# Patient Record
Sex: Male | Born: 1970
Health system: Southern US, Community
[De-identification: ages and names within clinical notes are randomized; demographics above are authoritative.]

## PROBLEM LIST (undated history)

## (undated) ENCOUNTER — Emergency Department (HOSPITAL_COMMUNITY): Disposition: A | Discharge status: Other Acute Inpatient Hospital | Payer: Medicaid Other

## (undated) DIAGNOSIS — I10 Essential (primary) hypertension: Secondary | ICD-10-CM

## (undated) DIAGNOSIS — F419 Anxiety disorder, unspecified: Secondary | ICD-10-CM

## (undated) DIAGNOSIS — N3090 Cystitis, unspecified without hematuria: Secondary | ICD-10-CM

## (undated) DIAGNOSIS — B029 Zoster without complications: Secondary | ICD-10-CM

## (undated) DIAGNOSIS — R51 Headache: Secondary | ICD-10-CM

## (undated) DIAGNOSIS — L0390 Cellulitis, unspecified: Secondary | ICD-10-CM

## (undated) DIAGNOSIS — E785 Hyperlipidemia, unspecified: Secondary | ICD-10-CM

## (undated) DIAGNOSIS — N189 Chronic kidney disease, unspecified: Secondary | ICD-10-CM

## (undated) DIAGNOSIS — G4733 Obstructive sleep apnea (adult) (pediatric): Secondary | ICD-10-CM

## (undated) DIAGNOSIS — H3500 Unspecified background retinopathy: Secondary | ICD-10-CM

## (undated) DIAGNOSIS — J45909 Unspecified asthma, uncomplicated: Secondary | ICD-10-CM

## (undated) DIAGNOSIS — N182 Chronic kidney disease, stage 2 (mild): Secondary | ICD-10-CM

## (undated) DIAGNOSIS — J069 Acute upper respiratory infection, unspecified: Secondary | ICD-10-CM

## (undated) DIAGNOSIS — Z8719 Personal history of other diseases of the digestive system: Secondary | ICD-10-CM

## (undated) DIAGNOSIS — R011 Cardiac murmur, unspecified: Secondary | ICD-10-CM

## (undated) DIAGNOSIS — E1140 Type 2 diabetes mellitus with diabetic neuropathy, unspecified: Secondary | ICD-10-CM

## (undated) HISTORY — DX: Essential (primary) hypertension: I10

## (undated) HISTORY — DX: Obstructive sleep apnea (adult) (pediatric): G47.33

## (undated) HISTORY — DX: Type 2 diabetes mellitus with diabetic neuropathy, unspecified: E11.40

## (undated) HISTORY — DX: Hyperlipidemia, unspecified: E78.5

## (undated) HISTORY — DX: Anxiety disorder, unspecified: F41.9

## (undated) HISTORY — DX: Unspecified background retinopathy: H35.00

## (undated) HISTORY — DX: Zoster without complications: B02.9

---
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
## 1999-12-23 ENCOUNTER — Encounter: Payer: Self-pay | Admitting: Emergency Medicine

## 1999-12-23 ENCOUNTER — Emergency Department (HOSPITAL_COMMUNITY): Admission: EM | Admit: 1999-12-23 | Discharge: 1999-12-23 | Payer: Self-pay | Admitting: Emergency Medicine

## 2000-10-11 ENCOUNTER — Emergency Department (HOSPITAL_COMMUNITY): Admission: EM | Admit: 2000-10-11 | Discharge: 2000-10-11 | Payer: Self-pay | Admitting: Emergency Medicine

## 2004-07-15 ENCOUNTER — Ambulatory Visit: Payer: Self-pay | Admitting: Family Medicine

## 2004-07-22 ENCOUNTER — Ambulatory Visit: Payer: Self-pay | Admitting: Family Medicine

## 2004-08-12 ENCOUNTER — Ambulatory Visit: Payer: Self-pay | Admitting: Family Medicine

## 2004-08-19 ENCOUNTER — Ambulatory Visit: Payer: Self-pay | Admitting: Family Medicine

## 2004-11-25 ENCOUNTER — Ambulatory Visit: Payer: Self-pay | Admitting: Family Medicine

## 2005-03-24 ENCOUNTER — Ambulatory Visit: Payer: Self-pay | Admitting: Family Medicine

## 2005-06-23 ENCOUNTER — Ambulatory Visit: Payer: Self-pay | Admitting: Family Medicine

## 2006-07-05 ENCOUNTER — Ambulatory Visit: Payer: Self-pay | Admitting: Family Medicine

## 2006-10-21 ENCOUNTER — Ambulatory Visit: Payer: Self-pay | Admitting: Internal Medicine

## 2006-10-22 ENCOUNTER — Ambulatory Visit: Payer: Self-pay | Admitting: Family Medicine

## 2006-10-22 LAB — CONVERTED CEMR LAB: Hgb A1c MFr Bld: 9.4 % — ABNORMAL HIGH (ref 4.6–6.0)

## 2006-11-03 ENCOUNTER — Ambulatory Visit: Payer: Self-pay | Admitting: Family Medicine

## 2006-12-08 ENCOUNTER — Encounter: Payer: Self-pay | Admitting: Family Medicine

## 2006-12-10 DIAGNOSIS — I10 Essential (primary) hypertension: Secondary | ICD-10-CM

## 2006-12-10 DIAGNOSIS — E11319 Type 2 diabetes mellitus with unspecified diabetic retinopathy without macular edema: Secondary | ICD-10-CM

## 2007-01-03 ENCOUNTER — Ambulatory Visit: Payer: Self-pay | Admitting: Family Medicine

## 2007-01-03 DIAGNOSIS — H6500 Acute serous otitis media, unspecified ear: Secondary | ICD-10-CM

## 2007-01-10 ENCOUNTER — Ambulatory Visit: Payer: Self-pay | Admitting: Family Medicine

## 2007-01-25 ENCOUNTER — Ambulatory Visit: Payer: Self-pay | Admitting: Family Medicine

## 2007-01-25 DIAGNOSIS — J019 Acute sinusitis, unspecified: Secondary | ICD-10-CM | POA: Insufficient documentation

## 2007-04-26 ENCOUNTER — Ambulatory Visit: Payer: Self-pay | Admitting: Family Medicine

## 2007-07-04 ENCOUNTER — Telehealth: Payer: Self-pay | Admitting: Family Medicine

## 2007-07-07 ENCOUNTER — Telehealth: Payer: Self-pay | Admitting: Family Medicine

## 2007-07-08 ENCOUNTER — Ambulatory Visit: Payer: Self-pay | Admitting: Family Medicine

## 2008-02-21 ENCOUNTER — Ambulatory Visit: Payer: Self-pay | Admitting: Family Medicine

## 2008-02-21 DIAGNOSIS — F411 Generalized anxiety disorder: Secondary | ICD-10-CM

## 2008-03-07 ENCOUNTER — Ambulatory Visit: Payer: Self-pay | Admitting: Family Medicine

## 2008-03-07 DIAGNOSIS — M722 Plantar fascial fibromatosis: Secondary | ICD-10-CM | POA: Insufficient documentation

## 2008-04-18 ENCOUNTER — Ambulatory Visit: Payer: Self-pay | Admitting: Family Medicine

## 2008-04-23 ENCOUNTER — Ambulatory Visit: Payer: Self-pay | Admitting: Family Medicine

## 2008-05-18 DIAGNOSIS — B029 Zoster without complications: Secondary | ICD-10-CM

## 2008-05-18 HISTORY — DX: Zoster without complications: B02.9

## 2008-05-28 ENCOUNTER — Ambulatory Visit: Payer: Self-pay | Admitting: Family Medicine

## 2008-05-28 DIAGNOSIS — B029 Zoster without complications: Secondary | ICD-10-CM | POA: Insufficient documentation

## 2008-05-28 DIAGNOSIS — F528 Other sexual dysfunction not due to a substance or known physiological condition: Secondary | ICD-10-CM | POA: Insufficient documentation

## 2008-07-08 ENCOUNTER — Telehealth: Payer: Self-pay | Admitting: Internal Medicine

## 2008-07-10 ENCOUNTER — Ambulatory Visit: Payer: Self-pay | Admitting: Family Medicine

## 2008-09-20 ENCOUNTER — Ambulatory Visit: Payer: Self-pay | Admitting: Family Medicine

## 2008-10-22 ENCOUNTER — Ambulatory Visit: Payer: Self-pay | Admitting: Family Medicine

## 2008-10-22 LAB — CONVERTED CEMR LAB
Bilirubin Urine: NEGATIVE
Glucose, Urine, Semiquant: >=1000
Ketones, urine, test strip: NEGATIVE
Nitrite: NEGATIVE
Protein, U semiquant: NEGATIVE
Specific Gravity, Urine: 1.02
Urobilinogen, UA: 0.2
WBC Urine, dipstick: NEGATIVE
pH: 5

## 2008-12-10 ENCOUNTER — Ambulatory Visit: Payer: Self-pay | Admitting: Family Medicine

## 2008-12-10 DIAGNOSIS — I803 Phlebitis and thrombophlebitis of lower extremities, unspecified: Secondary | ICD-10-CM | POA: Insufficient documentation

## 2008-12-11 ENCOUNTER — Ambulatory Visit: Payer: Self-pay

## 2008-12-12 ENCOUNTER — Encounter: Payer: Self-pay | Admitting: Family Medicine

## 2009-01-22 ENCOUNTER — Telehealth: Payer: Self-pay | Admitting: Family Medicine

## 2009-01-24 ENCOUNTER — Encounter: Payer: Self-pay | Admitting: Family Medicine

## 2009-02-04 ENCOUNTER — Telehealth: Payer: Self-pay | Admitting: Family Medicine

## 2009-02-09 ENCOUNTER — Telehealth: Payer: Self-pay | Admitting: Family Medicine

## 2009-02-12 ENCOUNTER — Ambulatory Visit: Payer: Self-pay | Admitting: Family Medicine

## 2009-02-13 ENCOUNTER — Telehealth: Payer: Self-pay | Admitting: Family Medicine

## 2009-02-13 LAB — CONVERTED CEMR LAB
ALT: 31 units/L (ref 0–53)
AST: 22 units/L (ref 0–37)
Albumin: 4.1 g/dL (ref 3.5–5.2)
Alkaline Phosphatase: 81 units/L (ref 39–117)
BUN: 13 mg/dL (ref 6–23)
Basophils Absolute: 0 10*3/uL (ref 0.0–0.1)
Basophils Relative: 0.6 % (ref 0.0–3.0)
Bilirubin, Direct: 0.1 mg/dL (ref 0.0–0.3)
CO2: 31 meq/L (ref 19–32)
Calcium: 8.9 mg/dL (ref 8.4–10.5)
Chloride: 108 meq/L (ref 96–112)
Cholesterol: 147 mg/dL (ref 0–200)
Creatinine, Ser: 0.7 mg/dL (ref 0.4–1.5)
Creatinine,U: 135.7 mg/dL
Eosinophils Absolute: 0.2 10*3/uL (ref 0.0–0.7)
Eosinophils Relative: 2.2 % (ref 0.0–5.0)
GFR calc non Af Amer: 133.38 mL/min (ref >60)
Glucose, Bld: 170 mg/dL — ABNORMAL HIGH (ref 70–99)
HCT: 45.6 % (ref 39.0–52.0)
HDL: 38.7 mg/dL — ABNORMAL LOW (ref >39.00)
Hemoglobin: 15.5 g/dL (ref 13.0–17.0)
Hgb A1c MFr Bld: 8.9 % — ABNORMAL HIGH (ref 4.6–6.5)
LDL Cholesterol: 90 mg/dL (ref 0–99)
Lymphocytes Relative: 33.9 % (ref 12.0–46.0)
Lymphs Abs: 2.3 10*3/uL (ref 0.7–4.0)
MCHC: 34.1 g/dL (ref 30.0–36.0)
MCV: 95.5 fL (ref 78.0–100.0)
Microalb Creat Ratio: 105.4 mg/g — ABNORMAL HIGH (ref 0.0–30.0)
Microalb, Ur: 14.3 mg/dL — ABNORMAL HIGH (ref 0.0–1.9)
Monocytes Absolute: 0.7 10*3/uL (ref 0.1–1.0)
Monocytes Relative: 10.5 % (ref 3.0–12.0)
Neutro Abs: 3.7 10*3/uL (ref 1.4–7.7)
Neutrophils Relative %: 52.8 % (ref 43.0–77.0)
Platelets: 211 10*3/uL (ref 150.0–400.0)
Potassium: 4.3 meq/L (ref 3.5–5.1)
RBC: 4.77 M/uL (ref 4.22–5.81)
RDW: 12.1 % (ref 11.5–14.6)
Sodium: 142 meq/L (ref 135–145)
TSH: 1.49 microintl units/mL (ref 0.35–5.50)
Total Bilirubin: 1.3 mg/dL — ABNORMAL HIGH (ref 0.3–1.2)
Total CHOL/HDL Ratio: 4
Total Protein: 6.6 g/dL (ref 6.0–8.3)
Triglycerides: 94 mg/dL (ref 0.0–149.0)
VLDL: 18.8 mg/dL (ref 0.0–40.0)
WBC: 6.9 10*3/uL (ref 4.5–10.5)

## 2009-02-19 ENCOUNTER — Ambulatory Visit: Payer: Self-pay | Admitting: Endocrinology

## 2009-02-19 DIAGNOSIS — R809 Proteinuria, unspecified: Secondary | ICD-10-CM | POA: Insufficient documentation

## 2009-05-08 ENCOUNTER — Ambulatory Visit: Payer: Self-pay | Admitting: Family Medicine

## 2009-05-23 ENCOUNTER — Ambulatory Visit: Payer: Self-pay | Admitting: Endocrinology

## 2009-07-02 ENCOUNTER — Telehealth: Payer: Self-pay | Admitting: Family Medicine

## 2009-09-13 ENCOUNTER — Telehealth: Payer: Self-pay | Admitting: Family Medicine

## 2009-09-16 ENCOUNTER — Telehealth: Payer: Self-pay | Admitting: Family Medicine

## 2009-09-17 ENCOUNTER — Ambulatory Visit: Payer: Self-pay | Admitting: Endocrinology

## 2009-09-17 LAB — CONVERTED CEMR LAB: TSH: 1.66 microintl units/mL (ref 0.35–5.50)

## 2009-12-23 ENCOUNTER — Telehealth: Payer: Self-pay | Admitting: Family Medicine

## 2010-01-15 ENCOUNTER — Telehealth: Payer: Self-pay | Admitting: Family Medicine

## 2010-01-21 ENCOUNTER — Ambulatory Visit: Payer: Self-pay | Admitting: Endocrinology

## 2010-01-21 DIAGNOSIS — L97909 Non-pressure chronic ulcer of unspecified part of unspecified lower leg with unspecified severity: Secondary | ICD-10-CM | POA: Insufficient documentation

## 2010-01-21 LAB — CONVERTED CEMR LAB
BUN: 20 mg/dL (ref 6–23)
CO2: 31 meq/L (ref 19–32)
Calcium: 9.3 mg/dL (ref 8.4–10.5)
Chloride: 97 meq/L (ref 96–112)
Creatinine, Ser: 0.7 mg/dL (ref 0.4–1.5)
GFR calc non Af Amer: 126.47 mL/min (ref >60)
Glucose, Bld: 292 mg/dL — ABNORMAL HIGH (ref 70–99)
Hgb A1c MFr Bld: 9.9 % — ABNORMAL HIGH (ref 4.6–6.5)
Potassium: 4.8 meq/L (ref 3.5–5.1)
Sodium: 138 meq/L (ref 135–145)

## 2010-01-27 ENCOUNTER — Telehealth: Payer: Self-pay | Admitting: Endocrinology

## 2010-02-05 ENCOUNTER — Ambulatory Visit: Payer: Self-pay

## 2010-02-05 ENCOUNTER — Ambulatory Visit: Payer: Self-pay | Admitting: Endocrinology

## 2010-02-25 ENCOUNTER — Telehealth: Payer: Self-pay | Admitting: Family Medicine

## 2010-03-04 ENCOUNTER — Encounter: Payer: Self-pay | Admitting: Endocrinology

## 2010-03-07 ENCOUNTER — Telehealth: Payer: Self-pay | Admitting: Endocrinology

## 2010-03-21 ENCOUNTER — Telehealth: Payer: Self-pay | Admitting: Endocrinology

## 2010-03-25 ENCOUNTER — Ambulatory Visit: Payer: Self-pay | Admitting: Endocrinology

## 2010-03-25 LAB — CONVERTED CEMR LAB: Hgb A1c MFr Bld: 7.5 % — ABNORMAL HIGH (ref 4.6–6.5)

## 2010-04-04 ENCOUNTER — Ambulatory Visit: Payer: Self-pay | Admitting: Family Medicine

## 2010-04-04 DIAGNOSIS — D179 Benign lipomatous neoplasm, unspecified: Secondary | ICD-10-CM | POA: Insufficient documentation

## 2010-04-18 ENCOUNTER — Telehealth: Payer: Self-pay | Admitting: Endocrinology

## 2010-05-13 ENCOUNTER — Ambulatory Visit
Admission: RE | Admit: 2010-05-13 | Discharge: 2010-05-13 | Payer: Self-pay | Source: Home / Self Care | Attending: Family Medicine | Admitting: Family Medicine

## 2010-05-16 ENCOUNTER — Telehealth: Payer: Self-pay | Admitting: Family Medicine

## 2010-05-18 HISTORY — PX: EYE SURGERY: SHX253

## 2010-05-28 ENCOUNTER — Ambulatory Visit
Admission: RE | Admit: 2010-05-28 | Discharge: 2010-05-28 | Payer: Self-pay | Source: Home / Self Care | Attending: Endocrinology | Admitting: Endocrinology

## 2010-06-04 ENCOUNTER — Ambulatory Visit
Admission: RE | Admit: 2010-06-04 | Discharge: 2010-06-04 | Payer: Self-pay | Source: Home / Self Care | Attending: Family Medicine | Admitting: Family Medicine

## 2010-06-04 ENCOUNTER — Encounter: Payer: Self-pay | Admitting: Family Medicine

## 2010-06-12 ENCOUNTER — Ambulatory Visit
Admission: RE | Admit: 2010-06-12 | Discharge: 2010-06-12 | Payer: Self-pay | Source: Home / Self Care | Attending: Family Medicine | Admitting: Family Medicine

## 2010-06-12 ENCOUNTER — Telehealth: Payer: Self-pay | Admitting: Family Medicine

## 2010-06-17 ENCOUNTER — Ambulatory Visit
Admission: RE | Admit: 2010-06-17 | Discharge: 2010-06-17 | Payer: Self-pay | Source: Home / Self Care | Attending: Family Medicine | Admitting: Family Medicine

## 2010-06-17 NOTE — Progress Notes (Signed)
Summary: 90 day supply  diovan/hct 160/12.5  Phone Note From Pharmacy   Caller: [facility]  [phone] Summary of Call: wants 90 day supply diovan 160/12.5  Initial call taken by: Pura Spice, RN,  February 25, 2010 4:17 PM  Follow-up for Phone Call        ok done  Follow-up by: Pura Spice, RN,  February 25, 2010 4:19 PM    New/Updated Medications: DIOVAN HCT 160-12.5 MG TABS (VALSARTAN-HYDROCHLOROTHIAZIDE) once daily Prescriptions: DIOVAN HCT 160-12.5 MG TABS (VALSARTAN-HYDROCHLOROTHIAZIDE) once daily  #90 x 1   Entered by:   Pura Spice, RN   Authorized by:   [name] MD   Signed by:   Pura Spice, RN on 02/25/2010   Method used:   Electronically to        [facility]* (retail)       [address]       [address] Shipping/mailing       [place], [place]  [address]       Ph: [phone]       Fax: [phone]   RxID:   [id number]

## 2010-06-17 NOTE — Progress Notes (Signed)
Summary: OV due  Phone Note Outgoing Call Call back at [facility] Phone [phone]   Call placed by: Brenton Grills MA,  March 07, 2010 4:26 PM Call placed to: Patient Details for Reason: OV due Summary of Call: Per MD, pt is due for F/U OV. Left detailed message to callback and schedule OV. Initial call taken by: Brenton Grills MA,  March 07, 2010 4:26 PM

## 2010-06-17 NOTE — Assessment & Plan Note (Signed)
Summary: 2 wk f/u / cd   Vital Signs:  Patient profile:   40 year old male Height:      69.5 inches (176.53 cm) Weight:      233 pounds (105.91 kg) BMI:     34.04 O2 Sat:      98 % on Room air Temp:     97.0 degrees F (36.11 degrees C) oral Pulse rate:   73 / minute BP sitting:   130 / 84  (left arm) Cuff size:   large  Vitals Entered By: Brenton Grills MA (February 05, 2010 9:15 AM)  O2 Flow:  Room air CC: 2 week F/U/left leg swelling after foot surgery/aj Is Patient Diabetic? Yes   CC:  2 week F/U/left leg swelling after foot surgery/aj.  History of Present Illness: pt states few days of moderate swelling at the left leg, but no assoc pain.  he had foot surgery last week. he takes parlodel 2.5 mg at bedtime, as well as the other 2 dm meds.  no cbg record, but states cbg's are high-100's.  Current Medications (verified): 1)  Diovan Hct 160-12.5 Mg Tabs (Valsartan-Hydrochlorothiazide) .... Once Daily 2)  Mens Multivitamin Plus  Tabs (Multiple Vitamins-Minerals) .... Daily 3)  Omega-3 Cf 1000 Mg Caps (Omega-3 Fatty Acids) .... One Daily 4)  Metoprolol Succinate 50 Mg Xr24h-Tab (Metoprolol Succinate) .... Once Daily 5)  Onglyza 5 Mg Tabs (Saxagliptin Hcl) .... Once Daily 6)  Aspirin 325 Mg Tabs (Aspirin) .Marland Kitchen.. 1 Daily 7)  Metformin Hcl 500 Mg Xr24h-Tab (Metformin Hcl) .... 2 Tabs Two Times A Day 8)  Bromocriptine Mesylate 2.5 Mg Tabs (Bromocriptine Mesylate) .Marland Kitchen.. 1 Tab At Bedtime 9)  Trueresult Test Strips 250.00 .... Use As Directed Two Times A Day 10)  Keflex 500 Mg Caps (Cephalexin) .Marland Kitchen.. 1 Tablet By Mouth Three Times A Day  Allergies (verified): 1)  ! Erythromycin 2)  ! * Hycodan  Past History:  Past Medical History: Last updated: 07/10/2008 Diabetes mellitus, type II Hypertension Anxiety shingles 1-10  Review of Systems  The patient denies chest pain and dyspnea on exertion.    Physical Exam  General:  obese.  no distress  Extremities:  left leg:  no  tend/warmth/erythema 2+ left pedal edema.     Impression & Recommendations:  Problem # 1:  EDEMA (ICD-782.3) Assessment Deteriorated unilateral, uncertain etiology  Problem # 2:  DIABETES MELLITUS, TYPE II (ICD-250.00) needs increased rx  Medications Added to Medication List This Visit: 1)  Keflex 500 Mg Caps (Cephalexin) .Marland Kitchen.. 1 tablet by mouth three times a day 2)  Victoza 18 Mg/36ml Soln (Liraglutide) .... 1.8 mg each am, and pen needles once daily  Other Orders: [facility] (Vascular) Est. Patient Level IV ([address])  Patient Instructions: 1)  change onglyza to victoza.  there are 3 settings on the pen.  start at the lowest, then increase to the highest, as permitted by nausea (this medication also causes nausea, which goes away with time). 2)  please go today for a test for a blood clot of the left leg.  you will be advised with a time today for an appointment. 3)  Please schedule a follow-up appointment in 3 weeks. 4)  please go back to dr fry to address your health problems other than diabetes. Prescriptions: VICTOZA 18 MG/3ML SOLN (LIRAGLUTIDE) 1.8 mg each am, and pen needles once daily  #1 device x 11   Entered and Authorized by:   Minus Breeding MD   Signed  by:   Minus Breeding MD on 02/05/2010   Method used:   Electronically to        [facility]. [phone]* (retail)       [address]       [place], [place]  [address]       Ph: [phone]       Fax: [phone]   RxID:   [id number]

## 2010-06-17 NOTE — Progress Notes (Signed)
Summary: Rx refill req  Phone Note Refill Request Message from:  Patient on March 21, 2010 8:39 AM  Refills Requested: Medication #1:  METFORMIN HCL 500 MG XR24H-TAB 2 tabs two times a day   Dosage confirmed as above?Dosage Confirmed   Supply Requested: 1 month Pt advised of 30 day supply until appt.   Method Requested: Electronic Initial call taken by: Margaret Pyle, CMA,  March 21, 2010 8:39 AM    Prescriptions: METFORMIN HCL 500 MG XR24H-TAB (METFORMIN HCL) 2 tabs two times a day  #120 x 0   Entered by:   Margaret Pyle, CMA   Authorized by:   Minus Breeding MD   Signed by:   Margaret Pyle, CMA on 03/21/2010   Method used:   Electronically to        [facility]* (retail)       [address]       [address] Shipping/mailing       [place], [place]  [address]       Ph: [phone]       Fax: [phone]   RxID:   [id number]

## 2010-06-17 NOTE — Progress Notes (Signed)
Summary: Pt req samples of Diovan and Onglyza  Phone Note Call from Patient Call back at [facility] Phone [phone]   Caller: Patient Summary of Call: Pt req samples of Diovan and Onglyza. 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  Initial call taken by: Lucy Antigua,  July 02, 2009 9:19 AM    Prescriptions: ONGLYZA 5 MG TABS (SAXAGLIPTIN HCL) once daily  #30 x 2   Entered by:   Alfred Levins, CMA   Authorized by:   [name] MD   Signed by:   Alfred Levins, CMA on 07/02/2009   Method used:   Samples Given   RxID:   [id number] DIOVAN HCT 160-12.5 MG TABS (VALSARTAN-HYDROCHLOROTHIAZIDE) once daily  #30 x 1   Entered by:   Alfred Levins, CMA   Authorized by:   [name] MD   Signed by:   Alfred Levins, CMA on 07/02/2009   Method used:   Samples Given   RxID:   [id number]

## 2010-06-17 NOTE — Progress Notes (Signed)
Summary: Rx req  Phone Note Call from Patient Call back at Home Phone [phone]   Caller: Patient Summary of Call: Pt called stating that he recieved a new free Glucometer and  needs test strips called to [facility]. Initial call taken by: Margaret Pyle, CMA,  January 27, 2010 1:43 PM    New/Updated Medications: * TRUERESULT TEST STRIPS 250.00 use as directed two times a day Prescriptions: TRUERESULT TEST STRIPS 250.00 use as directed two times a day  #100 x 5   Entered by:   Margaret Pyle, CMA   Authorized by:   Minus Breeding MD   Signed by:   Margaret Pyle, CMA on 01/27/2010   Method used:   Faxed to ...       [facility]* (retail)       [address]       [address] Shipping/mailing       [place], [place]  [address]       Ph: [phone]       Fax: [phone]   RxID:   [id number]

## 2010-06-17 NOTE — Progress Notes (Signed)
Summary: new rx  Phone Note Call from Patient Call back at Home Phone [phone]   Caller: Patient Call For: Alex Salisbury MD Summary of Call: Pt needs new rx diovan 160 #90 with 3 refills can into [facility] [phone]. pt is out  Initial call taken by: Heron Sabins,  January 15, 2010 3:08 PM  Follow-up for Phone Call        please call this in Follow-up by: Alex Salisbury MD,  January 15, 2010 4:23 PM    Prescriptions: DIOVAN HCT 160-12.5 MG TABS (VALSARTAN-HYDROCHLOROTHIAZIDE) once daily  #30 x 5   Entered by:   Raechel Ache, RN   Authorized by:   Alex Salisbury MD   Signed by:   Raechel Ache, RN on 01/15/2010   Method used:   Electronically to        [facility]* (retail)       [address]       [address] Shipping/mailing       [place], [place]  [address]       Ph: [phone]       Fax: [phone]   RxID:   [id number]

## 2010-06-17 NOTE — Assessment & Plan Note (Signed)
Summary: FU Alex Ryan   Vital Signs:  Patient profile:   40 year old male Height:      69.5 inches Weight:      212.50 pounds BMI:     31.04 O2 Sat:      97 % on Room air Temp:     97.7 degrees F oral Pulse rate:   83 / minute BP sitting:   132 / 76  (left arm) Cuff size:   regular  Vitals Entered By: Lucious Groves (Sep 17, 2009 10:29 AM)  O2 Flow:  Room air CC: F/U--Pt c/o bronchitis/asthma issues x1.5 weeks./kb Is Patient Diabetic? Yes Pain Assessment Patient in pain? no      Comments Patient states that he has had fever, nausea (no vomiting), HA, light headedness, abdominal pain, and diarrhea. Patient questions if diarrhea is due to Glimepiride. Patient denies mucous producing cough./kb   CC:  F/U--Pt c/o bronchitis/asthma issues x1.5 weeks./kb.  History of Present Illness: the status of at least 3 ongoing medical problems is addressed today: asthma sxs are better dm:  no cbg record, but states cbg's are mostly low-100's.   pt says he has chronic diarrhea.   he will get on wife's health insurance soon.  Current Medications (verified): 1)  Metformin Hcl 1000 Mg Tabs (Metformin Hcl) .... Take 1 Tablet By Mouth Two Times A Day 2)  Diovan Hct 160-12.5 Mg Tabs (Valsartan-Hydrochlorothiazide) .... Once Daily 3)  Mens Multivitamin Plus  Tabs (Multiple Vitamins-Minerals) .... Daily 4)  Omega-3 Cf 1000 Mg Caps (Omega-3 Fatty Acids) .... One Daily 5)  Metoprolol Succinate 50 Mg Xr24h-Tab (Metoprolol Succinate) .... Once Daily 6)  Neurontin 300 Mg Caps (Gabapentin) .... Two Times A Day 7)  Onglyza 5 Mg Tabs (Saxagliptin Hcl) .... Once Daily 8)  Aspirin 325 Mg Tabs (Aspirin) .Marland Kitchen.. 1 Daily 9)  Glimepiride 1 Mg Tabs (Glimepiride) .... 1/2 Qam 10)  Amoxicillin 875 Mg Tabs (Amoxicillin) .... One Each Day Until Finished  Allergies (verified): 1)  ! Erythromycin 2)  ! * Hycodan  Past History:  Past Medical History: Last updated: 07/10/2008 Diabetes mellitus, type  II Hypertension Anxiety shingles 1-10  Review of Systems  The patient denies hypoglycemia.         asthma sxs are better  Physical Exam  General:  normal appearance.   Lungs:  Clear to auscultation bilaterally. Normal respiratory effort.  Pulses:  dorsalis pedis intact bilat.  Extremities:  no deformity.  no ulcer on the feet.  feet are of normal color and temp.  1+ right pedal edema and 1+ left pedal edema.  there is rust-colored discoloration c/w chronic venous insufficiency Neurologic:  cn 2-12 grossly intact.   readily moves all 4's.   sensation is intact to touch on the feet, but decreased from normal   Impression & Recommendations:  Problem # 1:  DIABETES MELLITUS, TYPE II (ICD-250.00) apparently well-controlled  Problem # 2:  asthma improved  Problem # 3:  diarrhea prob due to metformin  Medications Added to Medication List This Visit: 1)  Metformin Hcl 500 Mg Xr24h-tab (Metformin hcl) .... 2 tabs two times a day  Other Orders: Est. Patient Level III ([address]) TLB-TSH (Thyroid Stimulating Hormone) (84443-TSH)  Patient Instructions: 1)  check your blood sugar 1 time a day.  vary the time of day when you check, between before the 3 meals, and at bedtime.  also check if you have symptoms of your blood sugar being too high or too low.  please  keep a record of the readings and bring it to your next appointment here.  please call us sooner if you are having low blood sugar episodes. 2)  tests are being ordered for you today.  a few days after the test(s), please call [phone] to hear your test results. 3)  pending the test results, please continue the same medications for now. 4)  Please schedule a follow-up appointment in 3 months. 5)  please see dr fry for asthma. 6)  here are some samples of "januvia" 100 mg, to take interchangeably with onglyza 5 mg. 7)  (i changed metformin to -xr). 8)  (update: i left message on phone-tree:  rx as we  discussed) Prescriptions: METFORMIN HCL 500 MG XR24H-TAB (METFORMIN HCL) 2 tabs two times a day  #120 x 11   Entered and Authorized by:   Minus Breeding MD   Signed by:   Minus Breeding MD on 09/17/2009   Method used:   Electronically to        [facility] [address] [phone]* (retail)       [address]       [place], [place]  [address]       Ph: [phone]       Fax: [phone]   RxID:   [id number]

## 2010-06-17 NOTE — Progress Notes (Signed)
Summary: Pt req script for Diovan to [facility] [address] on [place]  Phone Note Call from Patient Call back at [facility] Phone [phone]   Caller: Patient Summary of Call: Pt req script for Diovan called in to [facility] [address] in [place].  Pt completely out. If script cant be called in today, pt would like some samples if posssible.   Initial call taken by: Lucy Antigua,  September 13, 2009 2:04 PM    Prescriptions: DIOVAN HCT 160-12.5 MG TABS (VALSARTAN-HYDROCHLOROTHIAZIDE) once daily  #30 x 5   Entered by:   Raechel Ache, RN   Authorized by:   [name] MD   Signed by:   Raechel Ache, RN on 09/13/2009   Method used:   Electronically to        [facility] [phone]* (retail)       [address]       [place], [place]  [address]       Ph: [phone]       Fax: [phone]   RxID:   [id number]

## 2010-06-17 NOTE — Miscellaneous (Signed)
Summary: Orders Update  Clinical Lists Changes  Orders: Added new Test order of Venous Duplex Lower Extremity (Venous Duplex Lower) - Signed 

## 2010-06-17 NOTE — Progress Notes (Signed)
Summary: no show  Phone Note Other Incoming   Summary of Call: no show Initial call taken by: Raechel Ache, RN,  December 23, 2009 10:19 AM

## 2010-06-17 NOTE — Progress Notes (Signed)
Summary: Med change  Phone Note Call from Patient Call back at Home Phone [phone]   Caller: Patient Summary of Call: Pt is requesting to change medications from Victoza to Byetta. Per pt, the cost is too much for Victoza and in January Byetta will be free on his [facility] Program-Please advise Initial call taken by: Brenton Grills CMA Duncan Dull),  April 18, 2010 8:48 AM  Follow-up for Phone Call        sent Follow-up by: Minus Breeding MD,  April 18, 2010 9:04 AM  Additional Follow-up for Phone Call Additional follow up Details #1::        pt informed Additional Follow-up by: Brenton Grills CMA Duncan Dull),  April 18, 2010 9:31 AM    New/Updated Medications: BYETTA 10 MCG PEN 10 MCG/0.04ML SOLN (EXENATIDE) 10 micrograms two times a day, and pen needles two times a day Prescriptions: BYETTA 10 MCG PEN 10 MCG/0.04ML SOLN (EXENATIDE) 10 micrograms two times a day, and pen needles two times a day  #30 days x 11   Entered and Authorized by:   Minus Breeding MD   Signed by:   Minus Breeding MD on 04/18/2010   Method used:   Electronically to        [facility]* (retail)       [address]       [address] Shipping/mailing       [place], [place]  [address]       Ph: [phone]       Fax: [phone]   RxID:   [id number]

## 2010-06-17 NOTE — Medication Information (Signed)
Summary: Link to Wellness  Link to Wellness   Imported By: Lester Manahawkin 03/12/2010 08:57:26  _____________________________________________________________________  External Attachment:    Type:   Image     Comment:   External Document

## 2010-06-17 NOTE — Assessment & Plan Note (Signed)
Summary: HAS NODULE ON RT SIDE//ALP/RUNING NOSE/RCD   Vital Signs:  Patient profile:   40 year old male Weight:      233 pounds O2 Sat:      98 % Temp:     97.5 degrees F Pulse rate:   92 / minute BP sitting:   110 / 74  (left arm) Cuff size:   large  Vitals Entered By: Pura Spice, RN (April 04, 2010 2:31 PM) CC: itchy eyes allergies  nose  burns c/o nodule  RLQ area   History of Present Illness: Here for several reasons. First last week he noticed a lump on his right flank that was slightly tender when his wife squeezed him there. Otherwise it doesn't bother him at all. Second, for several weeks he has had itchy burning eyes, runny nose, and sneezing. No HA or fever or ST or cough.   Allergies: 1)  ! Erythromycin 2)  ! * Hycodan  Past History:  Past Medical History: Diabetes mellitus, type II, sees Dr. Romero Belling Hypertension Anxiety shingles 1-10  Review of Systems  The patient denies anorexia, fever, weight loss, weight gain, vision loss, decreased hearing, hoarseness, chest pain, syncope, dyspnea on exertion, peripheral edema, prolonged cough, headaches, hemoptysis, abdominal pain, melena, hematochezia, severe indigestion/heartburn, hematuria, incontinence, genital sores, muscle weakness, suspicious skin lesions, transient blindness, difficulty walking, depression, unusual weight change, abnormal bleeding, enlarged lymph nodes, angioedema, breast masses, and testicular masses.    Physical Exam  General:  Well-developed,well-nourished,in no acute distress; alert,appropriate and cooperative throughout examination Head:  Normocephalic and atraumatic without obvious abnormalities. No apparent alopecia or balding. Eyes:  conjunctivae are pink, no DC seen. pupils equal, pupils round, pupils reactive to light, pupils react to accomodation, and corneas and lenses clear.   Ears:  External ear exam shows no significant lesions or deformities.  Otoscopic examination reveals  clear canals, tympanic membranes are intact bilaterally without bulging, retraction, inflammation or discharge. Hearing is grossly normal bilaterally. Nose:  External nasal examination shows no deformity or inflammation. Nasal mucosa are pink and moist without lesions or exudates. Mouth:  Oral mucosa and oropharynx without lesions or exudates.  Teeth in good repair. Neck:  No deformities, masses, or tenderness noted. Lungs:  Normal respiratory effort, chest expands symmetrically. Lungs are clear to auscultation, no crackles or wheezes. Abdomen:  Bowel sounds positive,abdomen soft and non-tender without organomegaly or hernias noted. There is a firm mobile mass on the right flank under the skin   Impression & Recommendations:  Problem # 1:  ALLERGIC RHINITIS (ICD-477.9)  His updated medication list for this problem includes:    Flonase 50 Mcg/act Susp (Fluticasone propionate) .Marland Kitchen... 2 sprays each nostril once daily  Problem # 2:  LIPOMA (ICD-214.9)  Complete Medication List: 1)  Diovan Hct 160-12.5 Mg Tabs (Valsartan-hydrochlorothiazide) .... Once daily 2)  Mens Multivitamin Plus Tabs (Multiple vitamins-minerals) .... Daily 3)  Omega-3 Cf 1000 Mg Caps (Omega-3 fatty acids) .... One daily 4)  Metoprolol Succinate 50 Mg Xr24h-tab (Metoprolol succinate) .... Once daily 5)  Aspirin 325 Mg Tabs (Aspirin) .Marland Kitchen.. 1 daily 6)  Metformin Hcl 500 Mg Xr24h-tab (Metformin hcl) .... 2 tabs two times a day 7)  Bromocriptine Mesylate 2.5 Mg Tabs (Bromocriptine mesylate) .Marland Kitchen.. 1 tab two times a day 8)  Trueresult Test Strips 250.00  .... Use as directed two times a day 9)  Victoza 18 Mg/46ml Soln (Liraglutide) .... 1.8 mg each am, and pen needles once daily 10)  Flonase 50 Mcg/act  Susp (Fluticasone propionate) .... 2 sprays each nostril once daily  Patient Instructions: 1)  reassured him the lipoma is benign. We will observe this only. Use Zyrtec two times a day and add Flonase once daily .  2)  Please  schedule a follow-up appointment as needed .  Prescriptions: FLONASE 50 MCG/ACT SUSP (FLUTICASONE PROPIONATE) 2 sprays each nostril once daily  #90 x 3    Entered and Authorized by:   [name] MD   Signed by:   [name] MD on 04/04/2010   Method used:   Print then Give to Patient   RxID:   [id number]    Orders Added: 1)  Est. Patient Level IV [[address]]

## 2010-06-17 NOTE — Assessment & Plan Note (Signed)
Summary: follow up to discuss sugar levels-lb   Vital Signs:  Patient profile:   40 year old male Height:      69.5 inches (176.53 cm) Weight:      229 pounds (104.09 kg) BMI:     33.45 O2 Sat:      97 % on Room air Temp:     97.0 degrees F (36.11 degrees C) oral Pulse rate:   86 / minute BP sitting:   112 / 64  (left arm) Cuff size:   large  Vitals Entered By: Brenton Grills MA (January 21, 2010 10:51 AM)  O2 Flow:  Room air CC: F/U appt to discuss CBG levels/pt is no longer taking Neurontin or Amoxicillin/aj Is Patient Diabetic? Yes   CC:  F/U appt to discuss CBG levels/pt is no longer taking Neurontin or Amoxicillin/aj.  History of Present Illness: pt says cbg was well-controlled until 5 days ago, when it increased without explanation, to 300's.  no steriod injection.  he ran out of glimepiride a few mos ago.  cbg has since improved back to 180, yesterday.   he has 2 weeks of slight blister on the left heel, and slight assoc numbness.  Current Medications (verified): 1)  Diovan Hct 160-12.5 Mg Tabs (Valsartan-Hydrochlorothiazide) .... Once Daily 2)  Mens Multivitamin Plus  Tabs (Multiple Vitamins-Minerals) .... Daily 3)  Omega-3 Cf 1000 Mg Caps (Omega-3 Fatty Acids) .... One Daily 4)  Metoprolol Succinate 50 Mg Xr24h-Tab (Metoprolol Succinate) .... Once Daily 5)  Neurontin 300 Mg Caps (Gabapentin) .... Two Times A Day 6)  Onglyza 5 Mg Tabs (Saxagliptin Hcl) .... Once Daily 7)  Aspirin 325 Mg Tabs (Aspirin) .Marland Kitchen.. 1 Daily 8)  Glimepiride 1 Mg Tabs (Glimepiride) .... 1/2 Qam 9)  Amoxicillin 875 Mg Tabs (Amoxicillin) .... One Each Day Until Finished 10)  Metformin Hcl 500 Mg Xr24h-Tab (Metformin Hcl) .... 2 Tabs Two Times A Day  Allergies (verified): 1)  ! Erythromycin 2)  ! * Hycodan  Past History:  Past Medical History: Last updated: 07/10/2008 Diabetes mellitus, type II Hypertension Anxiety shingles 1-10  Review of Systems       The patient complains of  weight gain.         denies hypoglycemia.    Physical Exam  General:  obese.   Pulses:  dorsalis pedis intact bilat.  Extremities:  no deformity.  no ulcer on the feet.  feet are of normal color and temp.  trace  right pedal edema and 1+ left pedal edema.  there is rust-colored discoloration c/w chronic venous insufficiency. there are heavy callouses at the left heel (where he also has a healing ulcer), plantar aspect of the right great toe, and the plantar aspect of the right foot.  mycotic toenails.   Neurologic:  sensation is intact to touch on the feet, but decreased from normal. Psych:  anxious.   Additional Exam:   Hemoglobin A1C       [H]  9.9 %     Impression & Recommendations:  Problem # 1:  DIABETES MELLITUS, TYPE II (ICD-250.00) needs increased rx  Problem # 2:  FOOT ULCER, LEFT (ICD-707.10) Assessment: New  Problem # 3:  edema this is a relative contraindication to actos  Medications Added to Medication List This Visit: 1)  Bromocriptine Mesylate 2.5 Mg Tabs (Bromocriptine mesylate) .Marland Kitchen.. 1 tab at bedtime  Other Orders: TLB-A1C / Hgb A1C (Glycohemoglobin) (83036-A1C) TLB-BMP (Basic Metabolic Panel-BMET) (80048-METABOL) Est. Patient Level IV (27253)  Patient  Instructions: 1)  check your blood sugar 1 time a day.  vary the time of day when you check, between before the 3 meals, and at bedtime.  also check if you have symptoms of your blood sugar being too high or too low.  please keep a record of the readings and bring it to your next appointment here.  please call us sooner if you are having low blood sugar episodes. 2)  tests are being ordered for you today.  please call [phone] to hear your test results. 3)  pending the test results, please add bromocriptine 2.5 mg at bedtime.  it has side-effects of nausea and dizziness, which improve with time.  therefore, you may wish to start with 1/2 pill at night, for the first 5-7 days or so.   4)  keep the affected areas  of your feet covered with antibiotic ointment, and a bandaid. 5)  Please schedule a follow-up appointment in 2 weeks. 6)  please also make an appointment with dr fry, for your other health problems.   7)  (update: i left message on phone-tree:  rx as we discussed) Prescriptions: BROMOCRIPTINE MESYLATE 2.5 MG TABS (BROMOCRIPTINE MESYLATE) 1 tab at bedtime  #90 x 3   Entered and Authorized by:   Minus Breeding MD   Signed by:   Minus Breeding MD on 01/21/2010   Method used:   Electronically to        [facility]* (retail)       [address]       [address] Shipping/mailing       [place], [place]  [address]       Ph: [phone]       Fax: [phone]   RxID:   [id number] ONGLYZA 5 MG TABS (SAXAGLIPTIN HCL) once daily  #90 x 3   Entered and Authorized by:   Minus Breeding MD   Signed by:   Minus Breeding MD on 01/21/2010   Method used:   Electronically to        [facility]* (retail)       [address]       [address] Shipping/mailing       [place], [place]  [address]       Ph: [phone]       Fax: [phone]   RxID:   [id number]

## 2010-06-17 NOTE — Assessment & Plan Note (Signed)
Summary: f/u appt/#/cd   Vital Signs:  Patient profile:   40 year old male Height:      69.5 inches (176.53 cm) Weight:      224 pounds (101.82 kg) BMI:     32.72 O2 Sat:      97 % on Room air Temp:     97.9 degrees F (36.61 degrees C) oral Pulse rate:   89 / minute BP sitting:   106 / 68  (left arm) Cuff size:   large  Vitals Entered By: Brenton Grills CMA Duncan Dull) (March 25, 2010 8:44 AM)  O2 Flow:  Room air CC: Follow-up visit/pt is no longer taking Keflex/aj Is Patient Diabetic? Yes   CC:  Follow-up visit/pt is no longer taking Keflex/aj.  History of Present Illness: pt has lost weight, due to his efforts.  no cbg record, but states cbg's are still in the high-100's.  pt states he feels well in general.   Current Medications (verified): 1)  Diovan Hct 160-12.5 Mg Tabs (Valsartan-Hydrochlorothiazide) .... Once Daily 2)  Mens Multivitamin Plus  Tabs (Multiple Vitamins-Minerals) .... Daily 3)  Omega-3 Cf 1000 Mg Caps (Omega-3 Fatty Acids) .... One Daily 4)  Metoprolol Succinate 50 Mg Xr24h-Tab (Metoprolol Succinate) .... Once Daily 5)  Aspirin 325 Mg Tabs (Aspirin) .Marland Kitchen.. 1 Daily 6)  Metformin Hcl 500 Mg Xr24h-Tab (Metformin Hcl) .... 2 Tabs Two Times A Day 7)  Bromocriptine Mesylate 2.5 Mg Tabs (Bromocriptine Mesylate) .Marland Kitchen.. 1 Tab At Bedtime 8)  Trueresult Test Strips 250.00 .... Use As Directed Two Times A Day 9)  Keflex 500 Mg Caps (Cephalexin) .Marland Kitchen.. 1 Tablet By Mouth Three Times A Day 10)  Victoza 18 Mg/83ml Soln (Liraglutide) .... 1.8 Mg Each Am, and Pen Needles Once Daily  Allergies (verified): 1)  ! Erythromycin 2)  ! * Hycodan  Past History:  Past Medical History: Last updated: 07/10/2008 Diabetes mellitus, type II Hypertension Anxiety shingles 1-10  Review of Systems       denies nausea and dizziness  Physical Exam  General:  normal appearance.   Pulses:  dorsalis pedis intact bilat.  Extremities:  no deformity.  no ulcer on the feet.  feet are of  normal color and temp.  no edema there is hyperpigmentation of the feet. trace right pedal edema and 1+ left pedal edema.   there is a healed ulcer and heavy callous, at the right great toe, plantar aspect.   Neurologic:  sensation is intact to touch on the feet, but decreased from normal. Additional Exam:   Hemoglobin A1C       [H]  7.5 %    Impression & Recommendations:  Problem # 1:  DIABETES MELLITUS, TYPE II (ICD-250.00) needs increased rx  Medications Added to Medication List This Visit: 1)  Bromocriptine Mesylate 2.5 Mg Tabs (Bromocriptine mesylate) .Marland Kitchen.. 1 tab two times a day  Other Orders: TLB-A1C / Hgb A1C (Glycohemoglobin) (83036-A1C) Est. Patient Level III ([address])  Patient Instructions: 1)  blood tests are being ordered for you today.  please call [phone] to hear your test results. 2)  pending the test results, please increase bromocriptine to 2.5 mg two times a day. 3)  Please schedule a follow-up appointment in 3 months. 4)  (update: i left message on phone-tree:  rx as we discussed) Prescriptions: VICTOZA 18 MG/3ML SOLN (LIRAGLUTIDE) 1.8 mg each am, and pen needles once daily  #1 month x 11   Entered and Authorized by:   Minus Breeding MD  Signed by:   Minus Breeding MD on 03/25/2010   Method used:   Print then Give to Patient   RxID:   [id number] NIDPOEU 18 MG/3ML SOLN (LIRAGLUTIDE) 1.8 mg each am, and pen needles once daily  #3 months x 3   Entered and Authorized by:   Minus Breeding MD   Signed by:   Minus Breeding MD on 03/25/2010   Method used:   Electronically to        [facility]* (retail)       [address]       [address] Shipping/mailing       [place], [place]  [address]       Ph: [phone]       Fax: [phone]   RxID:   [id number] METFORMIN HCL 500 MG XR24H-TAB (METFORMIN HCL) 2 tabs two times a day  #360 x 3   Entered and Authorized by:   Minus Breeding MD   Signed by:   Minus Breeding MD on 03/25/2010    Method used:   Electronically to        [facility]* (retail)       [address]       [address] Shipping/mailing       [place], [place]  [address]       Ph: [phone]       Fax: [phone]   RxID:   [id number] BROMOCRIPTINE MESYLATE 2.5 MG TABS (BROMOCRIPTINE MESYLATE) 1 tab two times a day  #180 x 3   Entered and Authorized by:   Minus Breeding MD   Signed by:   Minus Breeding MD on 03/25/2010   Method used:   Electronically to        [facility]* (retail)       [address]       [address] Shipping/mailing       [place], [place]  [address]       Ph: [phone]       Fax: [phone]   RxID:   [id number]    Orders Added: 1)  TLB-A1C / Hgb A1C (Glycohemoglobin) [83036-A1C] 2)  Est. Patient Level III [12878]

## 2010-06-17 NOTE — Progress Notes (Signed)
Summary: Pt req script for Amoxicillin due to Bronchitus  Phone Note Call from Patient Call back at Home Phone [phone]   Caller: Patient Summary of Call: Pt is req script for Amoxicillin due to bronchitus. Please call in to [facility] [address] in [place].  Initial call taken by: Lucy Antigua,  Sep 16, 2009 10:31 AM  Follow-up for Phone Call        call this in, 875 mg two times a day for 10 days Follow-up by: [name] MD,  Sep 16, 2009 12:54 PM  Additional Follow-up for Phone Call Additional follow up Details #1::        Rx Called In Additional Follow-up by: Raechel Ache, RN,  Sep 16, 2009 1:31 PM

## 2010-06-19 NOTE — Assessment & Plan Note (Signed)
Summary: SUGAR LEVEL 199-200--STC   Vital Signs:  Patient profile:   40 year old male Height:      69.5 inches (176.53 cm) Weight:      227.75 pounds (103.52 kg) BMI:     33.27 O2 Sat:      97 % on Room air Temp:     98.2 degrees F (36.78 degrees C) oral Pulse rate:   85 / minute Pulse rhythm:   regular BP sitting:   118 / 64  (left arm) Cuff size:   large  Vitals Entered By: Brenton Grills CMA Duncan Dull) (May 28, 2010 8:09 AM)  O2 Flow:  Room air CC: Elevated CBG levels (190-200's)/aj Is Patient Diabetic? Yes   CC:  Elevated CBG levels (190-200's)/aj.  History of Present Illness: pt says cbg's have increased despite his losing weight.  pt states he feels well in general.  he is open to the idea of insulin if necessary, and he knows how to inject.   Current Medications (verified): 1)  Diovan Hct 160-12.5 Mg Tabs (Valsartan-Hydrochlorothiazide) .... Once Daily 2)  Mens Multivitamin Plus  Tabs (Multiple Vitamins-Minerals) .... Daily 3)  Omega-3 Cf 1000 Mg Caps (Omega-3 Fatty Acids) .... One Daily 4)  Metoprolol Succinate 50 Mg Xr24h-Tab (Metoprolol Succinate) .... Once Daily 5)  Aspirin 325 Mg Tabs (Aspirin) .Marland Kitchen.. 1 Daily 6)  Metformin Hcl 500 Mg Xr24h-Tab (Metformin Hcl) .... 2 Tabs Two Times A Day 7)  Bromocriptine Mesylate 2.5 Mg Tabs (Bromocriptine Mesylate) .Marland Kitchen.. 1 Tab Two Times A Day 8)  Trueresult Test Strips 250.00 .... Use As Directed Two Times A Day 9)  Flonase 50 Mcg/act Susp (Fluticasone Propionate) .... 2 Sprays Each Nostril Once Daily 10)  Byetta 10 Mcg Pen 10 Mcg/0.57ml Soln (Exenatide) .Marland Kitchen.. 10 Micrograms Two Times A Day, and Pen Needles Two Times A Day  Allergies (verified): 1)  ! Erythromycin 2)  ! * Hycodan  Past History:  Past Medical History: Last updated: 04/04/2010 Diabetes mellitus, type II, sees Dr. Romero Belling Hypertension Anxiety shingles 1-10  Review of Systems  The patient denies hypoglycemia.    Physical Exam  General:  obese.  no  distress  Pulses:  dorsalis pedis intact bilat.  Extremities:  no deformity.  no ulcer on the feet.  feet are of normal color and temp.  no edema there is hyperpigmentation of the feet. trace right pedal edema and 1+ left pedal edema.   there is a healed ulcer and heavy callous, at the right great toe, plantar aspect.   Neurologic:  sensation is intact to touch on the feet, but decreased from normal.   Impression & Recommendations:  Problem # 1:  DIABETES MELLITUS, TYPE II (ICD-250.00) Assessment Deteriorated  Medications Added to Medication List This Visit: 1)  Glimepiride 4 Mg Tabs (Glimepiride) .Marland Kitchen.. 1 tab two times a day  Other Orders: Est. Patient Level III ([address])  Patient Instructions: 1)  add glimepiride 4 mg two times a day 2)  check your blood sugar 1 time a day.  vary the time of day when you check, between before the 3 meals, and at bedtime.  also check if you have symptoms of your blood sugar being too high or too low.  please keep a record of the readings and bring it to your next appointment here.  please call us sooner if you are having low blood sugar episodes.   3)  Please schedule a follow-up appointment in 1 month. Prescriptions: DIOVAN HCT 160-12.5 MG TABS (  VALSARTAN-HYDROCHLOROTHIAZIDE) once daily  #90 x 3   Entered and Authorized by:   Minus Breeding MD   Signed by:   Minus Breeding MD on 05/28/2010   Method used:   Print then Give to Patient   RxID:   [id number] GLIMEPIRIDE 4 MG TABS (GLIMEPIRIDE) 1 tab two times a day  #180 x 3   Entered and Authorized by:   Minus Breeding MD   Signed by:   Minus Breeding MD on 05/28/2010   Method used:   Electronically to        [facility]* (retail)       [address]       [address] Shipping/mailing       [place], [place]  [address]       Ph: [phone]       Fax: [phone]   RxID:   [id number]    Orders Added: 1)  Est. Patient Level III [[address]75]

## 2010-06-19 NOTE — Progress Notes (Signed)
Summary: samples please   Phone Note Call from Patient Call back at [phone]   Caller: Patient Call For: [name] MD Summary of Call: pt needs diovan samples Initial call taken by: Heron Sabins,  May 16, 2010 2:08 PM  Follow-up for Phone Call        Pt called back to check on status of samples of diovan.  Follow-up by: Lucy Antigua,  May 16, 2010 3:12 PM  Additional Follow-up for Phone Call Additional follow up Details #1::        pt is aware no sample avail per deb Additional Follow-up by: Heron Sabins,  May 16, 2010 4:36 PM

## 2010-06-19 NOTE — Assessment & Plan Note (Signed)
Summary: chest congestion/njr   Vital Signs:  Patient profile:   39 year old male Weight:      226 pounds O2 Sat:      95 % on Room air Temp:     98.3 degrees F oral BP sitting:   120 / 80  (right arm)  Vitals Entered By: Duard Brady LPN (May 13, 2010 4:41 PM)  O2 Flow:  Room air CC: c/o head and chest congestion , nonproductive cough    fbs 253 Is Patient Diabetic? Yes Did you bring your meter with you today? No   History of Present Illness: Here for 2 weeks of sinus pressure, HA, PND, and a dry cough. On Mucinex. No fever.   Allergies: 1)  ! Erythromycin 2)  ! * Hycodan  Past History:  Past Medical History: Reviewed history from 04/04/2010 and no changes required. Diabetes mellitus, type II, sees Dr. Romero Belling Hypertension Anxiety shingles 1-10  Review of Systems  The patient denies anorexia, fever, weight loss, weight gain, vision loss, decreased hearing, hoarseness, chest pain, syncope, dyspnea on exertion, peripheral edema, hemoptysis, abdominal pain, melena, hematochezia, severe indigestion/heartburn, hematuria, incontinence, genital sores, muscle weakness, suspicious skin lesions, transient blindness, difficulty walking, depression, unusual weight change, abnormal bleeding, enlarged lymph nodes, angioedema, breast masses, and testicular masses.    Physical Exam  General:  Well-developed,well-nourished,in no acute distress; alert,appropriate and cooperative throughout examination Head:  Normocephalic and atraumatic without obvious abnormalities. No apparent alopecia or balding. Eyes:  No corneal or conjunctival inflammation noted. EOMI. Perrla. Funduscopic exam benign, without hemorrhages, exudates or papilledema. Vision grossly normal. Ears:  External ear exam shows no significant lesions or deformities.  Otoscopic examination reveals clear canals, tympanic membranes are intact bilaterally without bulging, retraction, inflammation or discharge.  Hearing is grossly normal bilaterally. Nose:  External nasal examination shows no deformity or inflammation. Nasal mucosa are pink and moist without lesions or exudates. Mouth:  Oral mucosa and oropharynx without lesions or exudates.  Teeth in good repair. Neck:  No deformities, masses, or tenderness noted. Lungs:  Normal respiratory effort, chest expands symmetrically. Lungs are clear to auscultation, no crackles or wheezes.   Impression & Recommendations:  Problem # 1:  SINUSITIS, ACUTE NOS (ICD-461.9)  His updated medication list for this problem includes:    Flonase 50 Mcg/act Susp (Fluticasone propionate) .Marland Kitchen... 2 sprays each nostril once daily    Augmentin 875-125 Mg Tabs (Amoxicillin-pot clavulanate) .Marland Kitchen..Marland Kitchen Two times a day  Complete Medication List: 1)  Diovan Hct 160-12.5 Mg Tabs (Valsartan-hydrochlorothiazide) .... Once daily 2)  Mens Multivitamin Plus Tabs (Multiple vitamins-minerals) .... Daily 3)  Omega-3 Cf 1000 Mg Caps (Omega-3 fatty acids) .... One daily 4)  Metoprolol Succinate 50 Mg Xr24h-tab (Metoprolol succinate) .... Once daily 5)  Aspirin 325 Mg Tabs (Aspirin) .Marland Kitchen.. 1 daily 6)  Metformin Hcl 500 Mg Xr24h-tab (Metformin hcl) .... 2 tabs two times a day 7)  Bromocriptine Mesylate 2.5 Mg Tabs (Bromocriptine mesylate) .Marland Kitchen.. 1 tab two times a day 8)  Trueresult Test Strips 250.00  .... Use as directed two times a day 9)  Flonase 50 Mcg/act Susp (Fluticasone propionate) .... 2 sprays each nostril once daily 10)  Byetta 10 Mcg Pen 10 Mcg/0.92ml Soln (Exenatide) .Marland Kitchen.. 10 micrograms two times a day, and pen needles two times a day 11)  Augmentin 875-125 Mg Tabs (Amoxicillin-pot clavulanate) .... Two times a day  Patient Instructions: 1)  Please schedule a follow-up appointment as needed .  Prescriptions: AUGMENTIN 875-125 MG TABS (  AMOXICILLIN-POT CLAVULANATE) two times a day  #20 x 0   Entered and Authorized by:   [name] MD   Signed by:   [name] MD on  05/13/2010   Method used:   Electronically to        [facility] [phone]* (retail)       [address]       [place], [place]  [address]       Ph: [phone]       Fax: [phone]   RxID:   [id number]    Orders Added: 1)  Est. Patient Level IV [42595]

## 2010-06-19 NOTE — Assessment & Plan Note (Signed)
Summary: ACUTE/ELEVATED BP/RCD   Vital Signs:  Patient profile:   40 year old male O2 Sat:      98 % Temp:     98.4 degrees F Pulse rate:   81 / minute BP sitting:   164 / 100  (left arm) Cuff size:   large  Vitals Entered By: Pura Spice, RN (June 12, 2010 8:39 AM) CC: stated bp issues called "call a nurse" last nite was advised to take diovan 160mg  this am.   History of Present Illness: Here for elevated BPs. Several weeks ago we switched him from Diovan HCT 160-12.5 a day to Diovan 80 a day because his BP was dropping too low. He has felt fine since then, but in the past 2 days the BP has shot up to 170/110 at times.   Allergies: 1)  ! Erythromycin 2)  ! * Hycodan  Past History:  Past Medical History: Diabetes mellitus, type II, sees Dr. Romero Belling Hypertension Anxiety shingles 2010  Review of Systems  The patient denies anorexia, fever, weight loss, weight gain, vision loss, decreased hearing, hoarseness, chest pain, syncope, dyspnea on exertion, peripheral edema, prolonged cough, headaches, hemoptysis, abdominal pain, melena, hematochezia, severe indigestion/heartburn, hematuria, incontinence, genital sores, muscle weakness, suspicious skin lesions, transient blindness, difficulty walking, depression, unusual weight change, abnormal bleeding, enlarged lymph nodes, angioedema, breast masses, and testicular masses.    Physical Exam  General:  Well-developed,well-nourished,in no acute distress; alert,appropriate and cooperative throughout examination Lungs:  Normal respiratory effort, chest expands symmetrically. Lungs are clear to auscultation, no crackles or wheezes. Heart:  Normal rate and regular rhythm. S1 and S2 normal without gallop, murmur, click, rub or other extra sounds.   Impression & Recommendations:  Problem # 1:  HYPERTENSION (ICD-401.9)  The following medications were removed from the medication list:    Diovan 80 Mg Tabs (Valsartan) ..... Once  daily His updated medication list for this problem includes:    Metoprolol Succinate 50 Mg Xr24h-tab (Metoprolol succinate) ..... Once daily    Diovan Hct 160-12.5 Mg Tabs (Valsartan-hydrochlorothiazide) ..... Once daily  Complete Medication List: 1)  Mens Multivitamin Plus Tabs (Multiple vitamins-minerals) .... Daily 2)  Omega-3 Cf 1000 Mg Caps (Omega-3 fatty acids) .... One daily 3)  Metoprolol Succinate 50 Mg Xr24h-tab (Metoprolol succinate) .... Once daily 4)  Aspirin 325 Mg Tabs (Aspirin) .Marland Kitchen.. 1 daily 5)  Metformin Hcl 500 Mg Xr24h-tab (Metformin hcl) .... 2 tabs two times a day 6)  Bromocriptine Mesylate 2.5 Mg Tabs (Bromocriptine mesylate) .Marland Kitchen.. 1 by mouth once daily 7)  Trueresult Test Strips 250.00  .... Use as directed two times a day 8)  Flonase 50 Mcg/act Susp (Fluticasone propionate) .... 2 sprays each nostril once daily 9)  Byetta 10 Mcg Pen 10 Mcg/0.25ml Soln (Exenatide) .Marland Kitchen.. 10 micrograms two times a day, and pen needles two times a day 10)  Glimepiride 4 Mg Tabs (Glimepiride) .Marland Kitchen.. 1 tab two times a day 11)  Diovan Hct 160-12.5 Mg Tabs (Valsartan-hydrochlorothiazide) .... Once daily  Patient Instructions: 1)  I asked him to go back to taking one Diovan HCT every day for one week to get the BP back down and to see me again in one week. At that time I anticipate changing to Diovan 160 mg a day and stopping the HCTZ, depending on what his pressure does.    Orders Added: 1)  Est. Patient Level IV [19147]

## 2010-06-19 NOTE — Progress Notes (Signed)
Summary: Call A Nurse   Call-A-Nurse Triage Call Report Triage Record Num: [id number] Operator: Aundra Millet Patient Name: Alex Ryan Call Date & Time: 06/11/2010 10:11:01PM Patient Phone: [phone] PCP: Tera Mater. Clent Ridges Patient Gender: Male PCP Fax : [phone] Patient DOB: February 19, 1971 [facility] Name: [facility] Reason for Call: Pt calling stating that he had OV 06/02/2010 b/c BP had been 90's/50s. Pt had been taking Diovan 160/ HCTZ one qd and was changed to Diovan 80 mg one qd. Has been feeling better and monitoring BP and today, 06/11/2010, has been elevated 130/75, 136/89, then at 2115 BP 157/103 and took Metoprolol. At 2220 BP 155/104 P 93 during calling. Pt stated he feels fine. RN adv to contact office in am about elevated BP and care advice with call back parameters given. Protocol(s) Used: Hypertension, Diagnosed or Suspected Recommended Outcome per Protocol: See Provider within 24 hours Reason for Outcome: A sudden elevation in blood pressure AND has not been taking blood pressure medication as prescribed, or recently started, changed, or stopped taking prescription medication; or recently started taking nonprescription or alternative medicine Care Advice: Call EMS 911 if new symptoms develop, such as severe shortness of breath, chest pain, change in mental status, acute neurologic deficit, seizure, visual disturbances, pulse rate > 120 / minute, or very irregular pulse.  ~  ~ Call provider if systolic BP is 180 or greater, or if diastolic BP is 120 or greater.  ~ HEALTH PROMOTION / MAINTENANCE  ~ SYMPTOM / CONDITION MANAGEMENT  ~ List, or take, all current prescription(s), nonprescription or alternative medication(s) to provider for evaluation. Medication Advice: - Discontinue all nonprescription and alternative medications, especially stimulants, until evaluated by provider. - Take prescribed medications as directed, following label instructions for the  medication. - Do not change medications or dosing regimen until provider is consulted. - Know possible side effects of medication and what to do if they occur. - Tell provider all prescription, nonprescription or alternative medications that you take  ~ 06/11/2010 10:25:15PM Page 1 of 1 [id number]

## 2010-06-19 NOTE — Assessment & Plan Note (Signed)
Summary: consult re: low bp/cjr   Vital Signs:  Patient profile:   40 year old male Pulse rate:   84 / minute BP sitting:   120 / 80  (left arm) Cuff size:   large  Vitals Entered By: Pura Spice, RN (June 04, 2010 10:04 AM) CC: saw Dr Everardo All on Jan 05-28-2010 for Bs. Today, c/o low BP readings (has mointor at home)     History of Present Illness: here for concerns over low BPs. For the past few months he has noted frequent readings of 90s over 60s at home, and he often feels lightheaded during these times. His glucoses have been stable lately.   Allergies: 1)  ! Erythromycin 2)  ! * Hycodan  Past History:  Past Medical History: Reviewed history from 04/04/2010 and no changes required. Diabetes mellitus, type II, sees Dr. Romero Belling Hypertension Anxiety shingles 1-10  Review of Systems  The patient denies anorexia, fever, weight loss, weight gain, vision loss, decreased hearing, hoarseness, chest pain, syncope, dyspnea on exertion, peripheral edema, prolonged cough, headaches, hemoptysis, abdominal pain, melena, hematochezia, severe indigestion/heartburn, hematuria, incontinence, genital sores, muscle weakness, suspicious skin lesions, transient blindness, difficulty walking, depression, unusual weight change, abnormal bleeding, enlarged lymph nodes, angioedema, breast masses, and testicular masses.    Physical Exam  General:  Well-developed,well-nourished,in no acute distress; alert,appropriate and cooperative throughout examination Lungs:  Normal respiratory effort, chest expands symmetrically. Lungs are clear to auscultation, no crackles or wheezes. Heart:  Normal rate and regular rhythm. S1 and S2 normal without gallop, murmur, click, rub or other extra sounds.   Impression & Recommendations:  Problem # 1:  HYPERTENSION (ICD-401.9)  The following medications were removed from the medication list:    Diovan Hct 160-12.5 Mg Tabs (Valsartan-hydrochlorothiazide)  ..... Once daily His updated medication list for this problem includes:    Metoprolol Succinate 50 Mg Xr24h-tab (Metoprolol succinate) ..... Once daily    Diovan 80 Mg Tabs (Valsartan) ..... Once daily  Problem # 2:  DIABETES MELLITUS, TYPE II (ICD-250.00)  The following medications were removed from the medication list:    Diovan Hct 160-12.5 Mg Tabs (Valsartan-hydrochlorothiazide) ..... Once daily His updated medication list for this problem includes:    Aspirin 325 Mg Tabs (Aspirin) .Marland Kitchen... 1 daily    Metformin Hcl 500 Mg Xr24h-tab (Metformin hcl) .Marland Kitchen... 2 tabs two times a day    Byetta 10 Mcg Pen 10 Mcg/0.57ml Soln (Exenatide) .Marland KitchenMarland KitchenMarland KitchenMarland Kitchen 10 micrograms two times a day, and pen needles two times a day    Glimepiride 4 Mg Tabs (Glimepiride) .Marland Kitchen... 1 tab two times a day    Diovan 80 Mg Tabs (Valsartan) ..... Once daily  Complete Medication List: 1)  Mens Multivitamin Plus Tabs (Multiple vitamins-minerals) .... Daily 2)  Omega-3 Cf 1000 Mg Caps (Omega-3 fatty acids) .... One daily 3)  Metoprolol Succinate 50 Mg Xr24h-tab (Metoprolol succinate) .... Once daily 4)  Aspirin 325 Mg Tabs (Aspirin) .Marland Kitchen.. 1 daily 5)  Metformin Hcl 500 Mg Xr24h-tab (Metformin hcl) .... 2 tabs two times a day 6)  Bromocriptine Mesylate 2.5 Mg Tabs (Bromocriptine mesylate) .Marland Kitchen.. 1 by mouth once daily 7)  Trueresult Test Strips 250.00  .... Use as directed two times a day 8)  Flonase 50 Mcg/act Susp (Fluticasone propionate) .... 2 sprays each nostril once daily 9)  Byetta 10 Mcg Pen 10 Mcg/0.56ml Soln (Exenatide) .Marland Kitchen.. 10 micrograms two times a day, and pen needles two times a day 10)  Glimepiride 4 Mg Tabs (Glimepiride) .Marland KitchenMarland KitchenMarland Kitchen  1 tab two times a day 11)  Diovan 80 Mg Tabs (Valsartan) .... Once daily  Patient Instructions: 1)  Change the Diovan as above.  2)  Please schedule a follow-up appointment in 1 month.  Prescriptions: DIOVAN 80 MG TABS (VALSARTAN) once daily  #30 x 11   Entered and Authorized by:   [name] MD    Signed by:   [name] MD on 06/04/2010   Method used:   Electronically to        [facility]* (retail)       [address]       [address] Shipping/mailing       [place], [place]  [address]       Ph: [phone]       Fax: [phone]   RxID:   [id number]    Orders Added: 1)  Est. Patient Level IV [84132]

## 2010-06-23 ENCOUNTER — Telehealth: Payer: Self-pay | Admitting: Family Medicine

## 2010-06-23 NOTE — Telephone Encounter (Signed)
Has right leg swelling. He thinks that it is cellulits starting up. Wants Almira Coaster to return call.

## 2010-06-23 NOTE — Telephone Encounter (Signed)
Rt leg swollen a little bit. Does not feel warm to touch. Would like antibiotic [facility] Allergic to codeine and erythromycin

## 2010-06-24 ENCOUNTER — Encounter: Payer: Self-pay | Admitting: Family Medicine

## 2010-06-24 ENCOUNTER — Ambulatory Visit (INDEPENDENT_AMBULATORY_CARE_PROVIDER_SITE_OTHER): Payer: 59 | Admitting: Family Medicine

## 2010-06-24 DIAGNOSIS — R609 Edema, unspecified: Secondary | ICD-10-CM

## 2010-06-24 DIAGNOSIS — L0390 Cellulitis, unspecified: Secondary | ICD-10-CM

## 2010-06-24 MED ORDER — CEPHALEXIN 500 MG PO CAPS: 500.0000 mg | ORAL_CAPSULE | Freq: Three times a day (TID) | ORAL | Status: AC

## 2010-06-24 MED ORDER — FUROSEMIDE 20 MG PO TABS: 20.0000 mg | ORAL_TABLET | Freq: Every day | ORAL | Status: DC

## 2010-06-24 NOTE — Progress Notes (Signed)
  Subjective:    Patient ID: Alex Ryan, male    DOB: August 28, 1970, 40 y.o.   MRN: [id number]  HPI Here for several weeks of increased swelling in both feet and lower legs, although there is no pain. Also  The right lower leg has become slightly warm and red in the past few days. No fevers. His last bout of cellulitis was last September.    Review of Systems  Constitutional: Negative.   Respiratory: Negative.   Cardiovascular: Positive for leg swelling. Negative for chest pain and palpitations.  Skin: Positive for color change. Negative for wound.       Objective:   Physical Exam  Constitutional: He appears well-developed and well-nourished. No distress.  Cardiovascular: Normal rate, regular rhythm, normal heart sounds and intact distal pulses.  Exam reveals no gallop and no friction rub.   No murmur heard. Pulmonary/Chest: Effort normal and breath sounds normal.  Musculoskeletal: He exhibits edema. He exhibits no tenderness.  Skin: Skin is warm and dry. There is erythema.  The medial lower right leg is erythematous and warm, not tender        Assessment & Plan:  His cellulitis has returned. Elevate legs. Use Keflex. Try Lasix to control edema.

## 2010-06-25 NOTE — Assessment & Plan Note (Signed)
Summary: 1WK/FUP/RCD   Vital Signs:  Patient profile:   40 year old male O2 Sat:      98 % Pulse rate:   82 / minute BP sitting:   110 / 70  (left arm) Cuff size:   large  Vitals Entered By: Pura Spice, RN (June 17, 2010 8:41 AM) CC: follow-up visit BP   History of Present Illness: Here to follow up on high BP. For the past week he has been taking Diovan HCT as we discussed, and it has worked well. His BP is stable and he feels fine. Our plan was to now stop the diuretic.   Allergies: 1)  ! Erythromycin 2)  ! * Hycodan  Past History:  Past Medical History: Reviewed history from 06/12/2010 and no changes required. Diabetes mellitus, type II, sees Dr. Romero Belling Hypertension Anxiety shingles 2010  Review of Systems  The patient denies anorexia, fever, weight loss, weight gain, vision loss, decreased hearing, hoarseness, chest pain, syncope, dyspnea on exertion, peripheral edema, prolonged cough, headaches, hemoptysis, abdominal pain, melena, hematochezia, severe indigestion/heartburn, hematuria, incontinence, genital sores, muscle weakness, suspicious skin lesions, transient blindness, difficulty walking, depression, unusual weight change, abnormal bleeding, enlarged lymph nodes, angioedema, breast masses, and testicular masses.    Physical Exam  General:  Well-developed,well-nourished,in no acute distress; alert,appropriate and cooperative throughout examination Neck:  No deformities, masses, or tenderness noted. Lungs:  Normal respiratory effort, chest expands symmetrically. Lungs are clear to auscultation, no crackles or wheezes. Heart:  Normal rate and regular rhythm. S1 and S2 normal without gallop, murmur, click, rub or other extra sounds. Extremities:  no edema   Impression & Recommendations:  Problem # 1:  HYPERTENSION (ICD-401.9)  The following medications were removed from the medication list:    Diovan Hct 160-12.5 Mg Tabs  (Valsartan-hydrochlorothiazide) ..... Once daily His updated medication list for this problem includes:    Metoprolol Succinate 50 Mg Xr24h-tab (Metoprolol succinate) ..... Once daily    Diovan 160 Mg Tabs (Valsartan) ..... Once daily  Complete Medication List: 1)  Mens Multivitamin Plus Tabs (Multiple vitamins-minerals) .... Daily 2)  Omega-3 Cf 1000 Mg Caps (Omega-3 fatty acids) .... One daily 3)  Metoprolol Succinate 50 Mg Xr24h-tab (Metoprolol succinate) .... Once daily 4)  Aspirin 325 Mg Tabs (Aspirin) .Marland Kitchen.. 1 daily 5)  Metformin Hcl 500 Mg Xr24h-tab (Metformin hcl) .... 2 tabs two times a day 6)  Bromocriptine Mesylate 2.5 Mg Tabs (Bromocriptine mesylate) .Marland Kitchen.. 1 by mouth once daily 7)  Trueresult Test Strips 250.00  .... Use as directed two times a day 8)  Flonase 50 Mcg/act Susp (Fluticasone propionate) .... 2 sprays each nostril once daily 9)  Byetta 10 Mcg Pen 10 Mcg/0.10ml Soln (Exenatide) .Marland Kitchen.. 10 micrograms two times a day, and pen needles two times a day 10)  Glimepiride 4 Mg Tabs (Glimepiride) .Marland Kitchen.. 1 tab two times a day 11)  Diovan 160 Mg Tabs (Valsartan) .... Once daily  Patient Instructions: 1)  I gave him samples for Diovan 160 mg a day for 2 months.  2)  Please schedule a follow-up appointment in 2 months.    Orders Added: 1)  Est. Patient Level IV [04540]

## 2010-06-25 NOTE — Letter (Signed)
Summary: Eye Exam/[facility]  Eye Exam/[facility]   Imported By: Maryln Gottron 06/17/2010 09:33:12  _____________________________________________________________________  External Attachment:    Type:   Image     Comment:   External Document

## 2010-07-02 ENCOUNTER — Encounter: Payer: Self-pay | Admitting: Endocrinology

## 2010-07-02 ENCOUNTER — Ambulatory Visit (INDEPENDENT_AMBULATORY_CARE_PROVIDER_SITE_OTHER): Payer: 59 | Admitting: Endocrinology

## 2010-07-02 ENCOUNTER — Encounter (INDEPENDENT_AMBULATORY_CARE_PROVIDER_SITE_OTHER): Payer: Self-pay | Admitting: *Deleted

## 2010-07-02 ENCOUNTER — Other Ambulatory Visit: Payer: 59

## 2010-07-02 DIAGNOSIS — E119 Type 2 diabetes mellitus without complications: Secondary | ICD-10-CM

## 2010-07-04 LAB — CONVERTED CEMR LAB: Fructosamine: 259 micromoles/L (ref <285)

## 2010-07-09 NOTE — Assessment & Plan Note (Signed)
Summary: 3 mos f/u #/cd   Vital Signs:  Patient profile:   40 year old male Height:      69.5 inches (176.53 cm) Weight:      240 pounds (109.09 kg) BMI:     35.06 O2 Sat:      97 % on Room air Temp:     98.2 degrees F (36.78 degrees C) oral Pulse rate:   74 / minute Pulse rhythm:   regular BP sitting:   108 / 70  (left arm) Cuff size:   large  Vitals Entered By: Brenton Grills CMA Duncan Dull) (July 02, 2010 9:32 AM)  O2 Flow:  Room air CC: Follow up on DM/aj Is Patient Diabetic? Yes   CC:  Follow up on DM/aj.  History of Present Illness: no cbg record, but states cbg's are much better.  pt states he feels well in general, except that his cellulitis has recurred.  pt says he knows how to inject insulin, if necessary.  Current Medications (verified): 1)  Mens Multivitamin Plus  Tabs (Multiple Vitamins-Minerals) .... Daily 2)  Omega-3 Cf 1000 Mg Caps (Omega-3 Fatty Acids) .... One Daily 3)  Metoprolol Succinate 50 Mg Xr24h-Tab (Metoprolol Succinate) .... Once Daily 4)  Aspirin 325 Mg Tabs (Aspirin) .Marland Kitchen.. 1 Daily 5)  Metformin Hcl 500 Mg Xr24h-Tab (Metformin Hcl) .... 2 Tabs Two Times A Day 6)  Bromocriptine Mesylate 2.5 Mg Tabs (Bromocriptine Mesylate) .Marland Kitchen.. 1 By Mouth Once Daily 7)  Trueresult Test Strips 250.00 .... Use As Directed Two Times A Day 8)  Flonase 50 Mcg/act Susp (Fluticasone Propionate) .... 2 Sprays Each Nostril Once Daily 9)  Byetta 10 Mcg Pen 10 Mcg/0.82ml Soln (Exenatide) .Marland Kitchen.. 10 Micrograms Two Times A Day, and Pen Needles Two Times A Day 10)  Glimepiride 4 Mg Tabs (Glimepiride) .Marland Kitchen.. 1 Tab Two Times A Day 11)  Diovan 160 Mg Tabs (Valsartan) .... Once Daily  Allergies (verified): 1)  ! Erythromycin 2)  ! * Hycodan  Past History:  Past Medical History: Last updated: 06/12/2010 Diabetes mellitus, type II, sees Dr. Romero Belling Hypertension Anxiety shingles 2010  Review of Systems  The patient denies hypoglycemia.    Physical Exam  General:   obese.  no distress  Pulses:  dorsalis pedis intact bilat.  Extremities:  no deformity.  no ulcer on the feet.  feet are of normal temp.  no edema there is hyperpigmentation of the feet and ankles bilaterally. the achilles areas are prominent bilaterally.  1+ right pedal edema and 2+ left pedal edema.   no erythema Neurologic:  sensation is intact to touch on the feet, but decreased from normal.   Impression & Recommendations:  Problem # 1:  DIABETES MELLITUS, TYPE II (ICD-250.00) apparently improved  Other Orders: T-Fructosamine ([id number]) Est. Patient Level III (09811)  Patient Instructions: 1)  blood tests are being ordered for you today.  please call [phone] to hear your test results. 2)  check your blood sugar 1 time a day.  vary the time of day when you check, between before the 3 meals, and at bedtime.  also check if you have symptoms of your blood sugar being too high or too low.  please keep a record of the readings and bring it to your next appointment here.  please call us sooner if you are having low blood sugar episodes.   3)  Please schedule a follow-up appointment in 3 months.   Orders Added: 1)  T-Fructosamine [[id number]] 2)  Est. Patient  Level III [04540]

## 2010-07-14 ENCOUNTER — Other Ambulatory Visit: Payer: Self-pay | Admitting: Family Medicine

## 2010-07-16 ENCOUNTER — Ambulatory Visit (INDEPENDENT_AMBULATORY_CARE_PROVIDER_SITE_OTHER): Payer: 59 | Admitting: Family Medicine

## 2010-07-16 ENCOUNTER — Encounter: Payer: Self-pay | Admitting: Family Medicine

## 2010-07-16 VITALS — BP 106/62 | HR 76 | Temp 98.5°F | Resp 12 | Wt 241.0 lb

## 2010-07-16 DIAGNOSIS — J329 Chronic sinusitis, unspecified: Secondary | ICD-10-CM

## 2010-07-16 DIAGNOSIS — E119 Type 2 diabetes mellitus without complications: Secondary | ICD-10-CM

## 2010-07-16 MED ORDER — LEVOFLOXACIN 500 MG PO TABS: 500.0000 mg | ORAL_TABLET | Freq: Every day | ORAL | Status: AC

## 2010-07-16 NOTE — Progress Notes (Signed)
  Subjective:    Patient ID: Alex Ryan, male    DOB: 1970-07-24, 40 y.o.   MRN: [id number]  HPI Here for cellulitis of the right leg. He has been talking Keflex, and at first this was helping. The redness and warmth was going away until this week, but now is coming back. Still no pain or fever.    Review of Systems  Constitutional: Negative.   Musculoskeletal: Positive for joint swelling.  Skin: Positive for color change.       Objective:   Physical Exam  Constitutional: He appears well-developed and well-nourished.  Musculoskeletal:       The right lower leg is swollen, red, and warm. Not tender. No cords present.   Skin: Skin is warm. There is erythema.          Assessment & Plan:  Recurrent cellulitis. Switch to Levaquin

## 2010-07-31 ENCOUNTER — Encounter: Payer: Self-pay | Admitting: Family Medicine

## 2010-07-31 ENCOUNTER — Ambulatory Visit (INDEPENDENT_AMBULATORY_CARE_PROVIDER_SITE_OTHER): Payer: 59 | Admitting: Family Medicine

## 2010-07-31 VITALS — BP 110/72 | HR 81 | Temp 98.1°F

## 2010-07-31 DIAGNOSIS — L0291 Cutaneous abscess, unspecified: Secondary | ICD-10-CM

## 2010-07-31 DIAGNOSIS — G4730 Sleep apnea, unspecified: Secondary | ICD-10-CM

## 2010-07-31 DIAGNOSIS — L0390 Cellulitis, unspecified: Secondary | ICD-10-CM

## 2010-07-31 NOTE — Progress Notes (Signed)
  Subjective:    Patient ID: Alex Ryan, male    DOB: 06/08/1970, 39 y.o.   MRN: [id number]  HPI Here to follow up on sleep apnea as well as the cellulitis in the right lower leg. The cellulitis is much improved. He will finish out the antibiotics soon. He has snored most of his life, and he often wakes up gasping for breath.    Review of Systems  Constitutional: Negative.   Musculoskeletal: Negative.   Skin: Negative.        Objective:   Physical Exam  Musculoskeletal:       The right lower leg is still swollen but less so. No tenderness or warmth or erythema          Assessment & Plan:  Set up a sleep study for probable sleep apnea. Finish out the antibiotics.

## 2010-08-14 ENCOUNTER — Telehealth: Payer: Self-pay | Admitting: Family Medicine

## 2010-08-14 NOTE — Telephone Encounter (Signed)
We only have samples of 80 mg, so he can take two of these a day

## 2010-08-14 NOTE — Telephone Encounter (Signed)
Pt aware samples are ready for pickup 

## 2010-08-14 NOTE — Telephone Encounter (Signed)
Pt needs samples diovan 160mg .

## 2010-08-27 ENCOUNTER — Encounter: Payer: Self-pay | Admitting: Family Medicine

## 2010-09-01 ENCOUNTER — Encounter: Payer: Self-pay | Admitting: Pulmonary Disease

## 2010-09-01 ENCOUNTER — Ambulatory Visit (INDEPENDENT_AMBULATORY_CARE_PROVIDER_SITE_OTHER): Payer: 59 | Admitting: Pulmonary Disease

## 2010-09-01 VITALS — BP 124/76 | HR 85 | Temp 98.0°F | Ht 69.5 in | Wt 245.0 lb

## 2010-09-01 DIAGNOSIS — G4733 Obstructive sleep apnea (adult) (pediatric): Secondary | ICD-10-CM | POA: Insufficient documentation

## 2010-09-01 NOTE — Progress Notes (Signed)
  Subjective:    Patient ID: Alex Ryan, male    DOB: [birth year], 40 y.o.   MRN: [id number]  HPI The pt is a 39y/o male who I have been asked to see for possible osa.  Hx is positive for loud snoring with arousals, gasping episodes, witnessed apneas.  He has nonrestorative sleep, and can have sleep pressure during the day when inactive.  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  His mother comments that she sees him dozing at times when he sit down.  He admits to some sleep pressure when driving longer distances.   Sleep Questionnaire: What time do you typically go to bed?( Between what hours) 10pm- 12 am How long does it take you to fall asleep? 1 to 2 hours How many times during the night do you wake up? 2 What time do you get out of bed to start your day? 0630 Do you drive or operate heavy machinery in your occupation? Yes How much has your weight changed (up or down) over the past two years? (In pounds) 0 oz (0 kg) Have you ever had a sleep study before? No Do you currently use CPAP? No Do you wear oxygen at any time? No   Review of Systems  Constitutional: Negative for fever and unexpected weight change.  HENT: Negative for ear pain, nosebleeds, congestion, sore throat, rhinorrhea, sneezing, trouble swallowing, dental problem, postnasal drip and sinus pressure.   Eyes: Positive for itching. Negative for redness.  Respiratory: Positive for shortness of breath. Negative for cough, chest tightness and wheezing.   Cardiovascular: Positive for leg swelling. Negative for palpitations.  Gastrointestinal: Negative for nausea and vomiting.  Genitourinary: Negative for dysuria.  Musculoskeletal: Positive for joint swelling.  Skin: Negative for rash.  Neurological: Negative for headaches.  Hematological: Does not bruise/bleed easily.  Psychiatric/Behavioral: Negative for dysphoric mood. The patient is not nervous/anxious.        Objective:   Physical Exam Constitutional:  Obese  male, no acute distress  HENT:  Nares patent without discharge, but prominent turbinates bilat.  Oropharynx without exudate, palate and uvula are moderately elongated.  Eyes:  Perrla, eomi, no scleral icterus  Neck:  No JVD, no TMG  Cardiovascular:  Normal rate, regular rhythm, no rubs or gallops.  No murmurs        Intact distal pulses  Pulmonary :  Normal breath sounds, no stridor or respiratory distress   No rales, rhonchi, or wheezing  Abdominal:  Soft, nondistended, bowel sounds present.  No tenderness noted.   Musculoskeletal: mild lower extremity edema noted.  Lymph Nodes:  No cervical lymphadenopathy noted  Skin:  No cyanosis noted  Neurologic:  Alert, appropriate, moves all 4 extremities without obvious deficit.         Assessment & Plan:

## 2010-09-01 NOTE — Patient Instructions (Signed)
Will set up for sleep study, and call you with results Work on weight loss

## 2010-09-02 ENCOUNTER — Ambulatory Visit (INDEPENDENT_AMBULATORY_CARE_PROVIDER_SITE_OTHER): Payer: 59 | Admitting: Family Medicine

## 2010-09-02 ENCOUNTER — Encounter: Payer: Self-pay | Admitting: Family Medicine

## 2010-09-02 VITALS — BP 130/80 | HR 91 | Wt 244.0 lb

## 2010-09-02 DIAGNOSIS — L0291 Cutaneous abscess, unspecified: Secondary | ICD-10-CM

## 2010-09-02 DIAGNOSIS — I10 Essential (primary) hypertension: Secondary | ICD-10-CM

## 2010-09-02 DIAGNOSIS — E119 Type 2 diabetes mellitus without complications: Secondary | ICD-10-CM

## 2010-09-02 DIAGNOSIS — L0390 Cellulitis, unspecified: Secondary | ICD-10-CM

## 2010-09-02 NOTE — Progress Notes (Signed)
  Subjective:    Patient ID: Alex Ryan, male    DOB: 07/04/1970, 40 y.o.   MRN: [id number]  HPI Here to follow up on DM, HTN, and cellulitis. He has been seeing Dr. Lurene Shadow, and she has switched him to Humalog. He is taking this bid, and his glucoses are more stable. He still gets in the high 100s and low 200s, but no more extremes like before. He is taking 40 mg a day of Lasix, and his edema is much better. The cellulitis in the legs has resolved. He is on a 1200 calorie a day diet now. He saw Dr. Shelle Iron and has a sleep study scheduled for 09-29-10.    Review of Systems  Constitutional: Negative.   Respiratory: Negative.   Cardiovascular: Negative.   Skin: Negative.        Objective:   Physical Exam  Constitutional: He appears well-developed and well-nourished.  Cardiovascular: Normal rate, regular rhythm, normal heart sounds and intact distal pulses.   Pulmonary/Chest: Effort normal and breath sounds normal.  Musculoskeletal:       Trace edema in both lower legs   Skin: Skin is warm and dry. No rash noted. No erythema.          Assessment & Plan:  He is doing very well now all things considered. Stay on Lasix for now.

## 2010-09-05 ENCOUNTER — Encounter: Payer: Self-pay | Admitting: Pulmonary Disease

## 2010-09-05 NOTE — Assessment & Plan Note (Signed)
The pt's history is very suggestive of osa, and he is obese with abnormal upper airway anatomy.  I have had a long discussion with the pt about sleep apnea, including its impact on QOL and CV health.  I think he needs to have a sleep study done, and the pt is agreeable.  Will arrange followup once the results are available.

## 2010-09-10 ENCOUNTER — Encounter: Payer: 59 | Attending: Endocrinology

## 2010-09-10 DIAGNOSIS — Z713 Dietary counseling and surveillance: Secondary | ICD-10-CM | POA: Insufficient documentation

## 2010-09-10 DIAGNOSIS — E119 Type 2 diabetes mellitus without complications: Secondary | ICD-10-CM | POA: Insufficient documentation

## 2010-09-29 ENCOUNTER — Ambulatory Visit (HOSPITAL_BASED_OUTPATIENT_CLINIC_OR_DEPARTMENT_OTHER): Payer: 59 | Attending: Pulmonary Disease

## 2010-09-29 DIAGNOSIS — R000 Tachycardia, unspecified: Secondary | ICD-10-CM | POA: Insufficient documentation

## 2010-09-29 DIAGNOSIS — G4733 Obstructive sleep apnea (adult) (pediatric): Secondary | ICD-10-CM | POA: Insufficient documentation

## 2010-09-29 DIAGNOSIS — I491 Atrial premature depolarization: Secondary | ICD-10-CM | POA: Insufficient documentation

## 2010-10-01 ENCOUNTER — Ambulatory Visit: Payer: 59 | Admitting: Endocrinology

## 2010-10-02 ENCOUNTER — Encounter: Payer: 59 | Attending: Endocrinology

## 2010-10-02 ENCOUNTER — Telehealth: Payer: Self-pay | Admitting: *Deleted

## 2010-10-02 DIAGNOSIS — Z713 Dietary counseling and surveillance: Secondary | ICD-10-CM | POA: Insufficient documentation

## 2010-10-02 DIAGNOSIS — E119 Type 2 diabetes mellitus without complications: Secondary | ICD-10-CM | POA: Insufficient documentation

## 2010-10-02 NOTE — Telephone Encounter (Signed)
Pharmacy is calling to see if it is okay to switch from diovan to losartan so the patient can get the prescription for free?

## 2010-10-03 MED ORDER — LOSARTAN POTASSIUM 50 MG PO TABS: 50.0000 mg | ORAL_TABLET | Freq: Every day | ORAL | Status: DC

## 2010-10-03 NOTE — Telephone Encounter (Signed)
Losartan sent to [facility] and pt is aware

## 2010-10-03 NOTE — Telephone Encounter (Signed)
Okay, change to Losartan 50 mg a day, call in one year supply

## 2010-10-16 ENCOUNTER — Ambulatory Visit (INDEPENDENT_AMBULATORY_CARE_PROVIDER_SITE_OTHER): Payer: Commercial Managed Care - PPO | Admitting: Family Medicine

## 2010-10-16 ENCOUNTER — Encounter: Payer: Self-pay | Admitting: Family Medicine

## 2010-10-16 VITALS — BP 138/70 | HR 97 | Temp 100.7°F | Resp 16 | Wt 247.0 lb

## 2010-10-16 DIAGNOSIS — K529 Noninfective gastroenteritis and colitis, unspecified: Secondary | ICD-10-CM

## 2010-10-16 DIAGNOSIS — K5289 Other specified noninfective gastroenteritis and colitis: Secondary | ICD-10-CM

## 2010-10-16 MED ORDER — PROMETHAZINE HCL 25 MG PO TABS: 25.0000 mg | ORAL_TABLET | ORAL | Status: AC | PRN

## 2010-10-16 MED ORDER — CIPROFLOXACIN HCL 500 MG PO TABS: 500.0000 mg | ORAL_TABLET | Freq: Two times a day (BID) | ORAL | Status: AC

## 2010-10-16 NOTE — Progress Notes (Signed)
  Subjective:    Patient ID: Alex Ryan, male    DOB: 1970/08/14, 40 y.o.   MRN: [id number]  HPI Here with 3 days of chills, fevers, diffuse body aches, vomiting, and diarrhea. No respiratory symptoms. Drinking water and Gatorade but he has not eaten any food for 2 days.    Review of Systems  Constitutional: Positive for fever.  HENT: Negative.   Eyes: Negative.   Respiratory: Negative.   Cardiovascular: Negative.   Gastrointestinal: Positive for nausea, vomiting and diarrhea. Negative for abdominal pain, constipation, blood in stool, abdominal distention and anal bleeding.  Genitourinary: Negative.        Objective:   Physical Exam  Constitutional:       Alert but appears ill   Neck: No tracheal deviation present.  Cardiovascular: Normal rate, regular rhythm, normal heart sounds and intact distal pulses.   Pulmonary/Chest: Effort normal and breath sounds normal.  Abdominal: Soft. Bowel sounds are normal. He exhibits no distension and no mass. There is tenderness. There is no rebound and no guarding.       Diffusely tender   Lymphadenopathy:    He has no cervical adenopathy.          Assessment & Plan:  Rest, fluids. He is off work the rest of this week already

## 2010-10-21 ENCOUNTER — Telehealth: Payer: Self-pay | Admitting: Pulmonary Disease

## 2010-10-21 NOTE — Telephone Encounter (Signed)
See last entry in que

## 2010-10-21 NOTE — Telephone Encounter (Signed)
Will check and see if it has been read, and he can expect a call tomorrow.

## 2010-10-21 NOTE — Telephone Encounter (Signed)
KC, have you read his study yet? Please advise, thanks!

## 2010-10-22 DIAGNOSIS — R000 Tachycardia, unspecified: Secondary | ICD-10-CM

## 2010-10-22 DIAGNOSIS — G4733 Obstructive sleep apnea (adult) (pediatric): Secondary | ICD-10-CM

## 2010-10-22 DIAGNOSIS — I491 Atrial premature depolarization: Secondary | ICD-10-CM

## 2010-10-22 NOTE — Telephone Encounter (Signed)
Called, spoke with pt.  States he already has spoken with someone this am who scheduled OV to review results for tomorrow.  He voiced no further questions or concerns at this time.

## 2010-10-23 ENCOUNTER — Encounter: Payer: Self-pay | Admitting: Pulmonary Disease

## 2010-10-23 ENCOUNTER — Ambulatory Visit (INDEPENDENT_AMBULATORY_CARE_PROVIDER_SITE_OTHER): Payer: 59 | Admitting: Pulmonary Disease

## 2010-10-23 VITALS — BP 130/74 | HR 85 | Temp 98.1°F | Ht 69.5 in | Wt 247.0 lb

## 2010-10-23 DIAGNOSIS — G4733 Obstructive sleep apnea (adult) (pediatric): Secondary | ICD-10-CM

## 2010-10-23 NOTE — Procedures (Signed)
NAMEAREND, BAHL NO.:  [id number]  MEDICAL RECORD NO.:  [id number]          PATIENT TYPE:  OUT  LOCATION:  SLEEP CENTER                 FACILITY:  [facility]  PHYSICIAN:  Barbaraann Share, MD,FCCPDATE OF BIRTH:  09-01-69  DATE OF STUDY:  09/29/2010                           NOCTURNAL POLYSOMNOGRAM  REFERRING PHYSICIAN:  KEITH M CLANCE  LOCATION:  Sleep Lab.  INDICATION FOR STUDY:  Hypersomnia with sleep apnea.  EPWORTH SLEEPINESS SCORE:  7.  MEDICATIONS:  SLEEP ARCHITECTURE:  The patient had a total sleep time of 364 minutes with no slow wave sleep and only 50 minutes of REM.  Sleep onset latency was normal at 16 minutes, and REM onset was normal at 98 minutes.  Sleep efficiency was excellent at 92%.  RESPIRATORY DATA:  The patient was found to have 103 apneas and 239 obstructive hypopneas, giving him an apnea/hypopnea index of 56 events per hour.  The events occurred in all body positions, but were clearly worse in the supine position.  There was loud snoring noted throughout.  OXYGEN DATA:  There was O2 desaturation as low as 75% with the patient's obstructive events.  CARDIAC DATA:  The patient had an occasional PAC, and also 1 run of tachycardia at 132 beats per minute for 5 seconds and associated with an obstructive apnea.  MOVEMENT-PARASOMNIA:  The patient had no significant leg jerks or other abnormal behavior seen.  IMPRESSIONS-RECOMMENDATIONS: 1. Severe obstructive sleep apnea/hypopnea syndrome with an AHI of 56     events per hour and O2 desaturation as     low as 75%.  Treatment for this degree of sleep apnea should focus     primarily on CPAP as well as weight loss. 2. Occasional PAC with 1 run of tachycardia as described above.  This     was associated with an obstructive apnea.     Barbaraann Share, MD,FCCP Diplomate, American Board of Sleep Medicine Electronically Signed    [facility]  D:  10/22/2010 08:55:24  T:  10/23/2010  00:29:11  Job:  [id number]

## 2010-10-23 NOTE — Progress Notes (Signed)
  Subjective:    Patient ID: Alex Ryan, male    DOB: Jun 04, 1970, 40 y.o.   MRN: [id number]  HPI The pt comes in today for f/u of his recent sleep study.  He was found to have severe osa, with AHI 56/hr.   I have reviewed the study with he and his wife, and have answered all of their questions.    Review of Systems  Constitutional: Negative for fever and unexpected weight change.  HENT: Negative for ear pain, nosebleeds, congestion, sore throat, rhinorrhea, sneezing, trouble swallowing, dental problem, postnasal drip and sinus pressure.   Eyes: Positive for itching. Negative for redness.  Respiratory: Negative for cough, chest tightness, shortness of breath and wheezing.   Cardiovascular: Positive for leg swelling. Negative for palpitations.  Gastrointestinal: Negative for nausea and vomiting.  Genitourinary: Negative for dysuria.  Musculoskeletal: Negative for joint swelling.  Skin: Negative for rash.  Neurological: Negative for headaches.  Hematological: Does not bruise/bleed easily.  Psychiatric/Behavioral: Negative for dysphoric mood. The patient is not nervous/anxious.        Objective:   Physical Exam Obese male in nad No purulence or discharge noted from nares LE without edema, no cyanosis noted. Alert, oriented, moves all 4        Assessment & Plan:

## 2010-10-23 NOTE — Assessment & Plan Note (Signed)
The pt has severe osa by his sleep study, and will benefit from treatment with cpap and weight loss.  He is agreeable.  I will set the patient up on cpap at a moderate pressure level to allow for desensitization, and will troubleshoot the device over the next 4-6weeks if needed.  The pt is to call me if having issues with tolerance.  Will then optimize the pressure once patient is able to wear cpap on a consistent basis.

## 2010-10-23 NOTE — Patient Instructions (Signed)
Will set you up on cpap at a moderate level.  Please call if having issues with tolerance Work on weight loss followup with me in 5 weeks.

## 2010-11-06 ENCOUNTER — Encounter: Payer: Self-pay | Admitting: Pulmonary Disease

## 2010-11-21 ENCOUNTER — Encounter: Payer: Self-pay | Admitting: Pulmonary Disease

## 2011-02-10 ENCOUNTER — Ambulatory Visit (INDEPENDENT_AMBULATORY_CARE_PROVIDER_SITE_OTHER): Payer: 59 | Admitting: Family Medicine

## 2011-02-10 ENCOUNTER — Encounter: Payer: Self-pay | Admitting: Family Medicine

## 2011-02-10 VITALS — BP 138/82 | HR 84 | Temp 98.5°F | Wt 247.0 lb

## 2011-02-10 DIAGNOSIS — I10 Essential (primary) hypertension: Secondary | ICD-10-CM

## 2011-02-10 DIAGNOSIS — E119 Type 2 diabetes mellitus without complications: Secondary | ICD-10-CM

## 2011-02-10 NOTE — Progress Notes (Signed)
  Subjective:    Patient ID: Alex Ryan, male    DOB: Jun 16, 1970, 40 y.o.   MRN: [id number]  HPI Here for recent high BPs. Over the past week he has had a number of systolic readings in the 150s and 160s with diastolics in the 90s. He has felt lightheaded and weak at times also. No chest pains or SOB. His morning fasting glucoses are stable in the range of 110 to 130.    Review of Systems  Constitutional: Negative.   Respiratory: Negative.   Cardiovascular: Negative.   Neurological: Positive for light-headedness. Negative for headaches.       Objective:   Physical Exam  Constitutional: He is oriented to person, place, and time. He appears well-developed and well-nourished.  Neck: Neck supple. No thyromegaly present.  Cardiovascular: Normal rate, regular rhythm, normal heart sounds and intact distal pulses.   Pulmonary/Chest: Effort normal and breath sounds normal.  Lymphadenopathy:    He has no cervical adenopathy.  Neurological: He is alert and oriented to person, place, and time. No cranial nerve deficit. He exhibits normal muscle tone. Coordination normal.          Assessment & Plan:  Increase Losartan to 100 mg a day. Recheck in 2 weeks

## 2011-02-23 ENCOUNTER — Other Ambulatory Visit: Payer: Self-pay | Admitting: Family Medicine

## 2011-03-05 ENCOUNTER — Ambulatory Visit (INDEPENDENT_AMBULATORY_CARE_PROVIDER_SITE_OTHER): Payer: 59 | Admitting: Family Medicine

## 2011-03-05 ENCOUNTER — Encounter: Payer: Self-pay | Admitting: Family Medicine

## 2011-03-05 VITALS — BP 140/90 | HR 85 | Temp 98.5°F | Wt 243.0 lb

## 2011-03-05 DIAGNOSIS — I10 Essential (primary) hypertension: Secondary | ICD-10-CM

## 2011-03-05 MED ORDER — METOPROLOL SUCCINATE ER 100 MG PO TB24: 100.0000 mg | ORAL_TABLET | Freq: Every day | ORAL | Status: DC

## 2011-03-05 NOTE — Progress Notes (Signed)
  Subjective:    Patient ID: JALENE LACKO, male    DOB: 06-Sep-1970, 40 y.o.   MRN: [id number]  HPI Here for BPs that remain too high. 3 weeks ago we increased Losartan to 100 mg a day, and he has flt well. However his BP is still in the 150-160 range.   Review of Systems  Constitutional: Negative.   Respiratory: Negative.   Cardiovascular: Negative.        Objective:   Physical Exam  Constitutional: He appears well-developed and well-nourished.  Neck: No thyromegaly present.  Cardiovascular: Normal rate, regular rhythm, normal heart sounds and intact distal pulses.   Pulmonary/Chest: Effort normal and breath sounds normal.  Lymphadenopathy:    He has no cervical adenopathy.          Assessment & Plan:  Increase Metorpolol to 100 mg a day. Call back in one week with BP readings.

## 2011-03-11 ENCOUNTER — Telehealth: Payer: Self-pay | Admitting: Family Medicine

## 2011-03-11 MED ORDER — LOSARTAN POTASSIUM 50 MG PO TABS: 50.0000 mg | ORAL_TABLET | Freq: Two times a day (BID) | ORAL | Status: DC

## 2011-03-12 NOTE — Telephone Encounter (Signed)
Script sent  

## 2011-04-23 ENCOUNTER — Telehealth: Payer: Self-pay | Admitting: Family Medicine

## 2011-04-23 MED ORDER — FUROSEMIDE 40 MG PO TABS: 40.0000 mg | ORAL_TABLET | Freq: Every day | ORAL | Status: DC

## 2011-04-23 NOTE — Telephone Encounter (Signed)
Script sent e-scribe 

## 2011-05-19 HISTORY — PX: VITRECTOMY: SHX106

## 2011-06-22 ENCOUNTER — Other Ambulatory Visit (INDEPENDENT_AMBULATORY_CARE_PROVIDER_SITE_OTHER): Payer: 59

## 2011-06-22 DIAGNOSIS — Z0000 Encounter for general adult medical examination without abnormal findings: Secondary | ICD-10-CM

## 2011-06-22 LAB — POCT URINALYSIS DIPSTICK
Leukocytes, UA: NEGATIVE
Nitrite, UA: NEGATIVE
Urobilinogen, UA: 0.2

## 2011-06-22 LAB — BASIC METABOLIC PANEL
BUN: 16 mg/dL (ref 6–23)
CO2: 31 mEq/L (ref 19–32)
Calcium: 8.7 mg/dL (ref 8.4–10.5)
Chloride: 102 mEq/L (ref 96–112)
Creatinine, Ser: 0.7 mg/dL (ref 0.4–1.5)
GFR: 127.59 mL/min (ref >60.00)
Glucose, Bld: 268 mg/dL — ABNORMAL HIGH (ref 70–99)
Potassium: 5 mEq/L (ref 3.5–5.1)
Sodium: 140 mEq/L (ref 135–145)

## 2011-06-22 LAB — CBC WITH DIFFERENTIAL/PLATELET
Basophils Relative: 0.8 % (ref 0.0–3.0)
Eosinophils Relative: 4 % (ref 0.0–5.0)
Hemoglobin: 14.3 g/dL (ref 13.0–17.0)
Lymphocytes Relative: 30.7 % (ref 12.0–46.0)
Monocytes Relative: 11.8 % (ref 3.0–12.0)
Neutro Abs: 3.8 10*3/uL (ref 1.4–7.7)
Neutrophils Relative %: 52.7 % (ref 43.0–77.0)
RBC: 4.36 Mil/uL (ref 4.22–5.81)
WBC: 7.3 10*3/uL (ref 4.5–10.5)

## 2011-06-22 LAB — LIPID PANEL
Cholesterol: 167 mg/dL (ref 0–200)
LDL Cholesterol: 93 mg/dL (ref 0–99)
Triglycerides: 111 mg/dL (ref 0.0–149.0)

## 2011-06-22 LAB — MICROALBUMIN / CREATININE URINE RATIO
Creatinine,U: 43.6 mg/dL
Microalb Creat Ratio: 18.8 mg/g (ref 0.0–30.0)

## 2011-06-22 LAB — HEPATIC FUNCTION PANEL
ALT: 62 U/L — ABNORMAL HIGH (ref 0–53)
Albumin: 3.8 g/dL (ref 3.5–5.2)
Total Protein: 6.6 g/dL (ref 6.0–8.3)

## 2011-06-22 LAB — HEMOGLOBIN A1C: Hgb A1c MFr Bld: 8.3 % — ABNORMAL HIGH (ref 4.6–6.5)

## 2011-06-25 ENCOUNTER — Ambulatory Visit (INDEPENDENT_AMBULATORY_CARE_PROVIDER_SITE_OTHER): Payer: 59 | Admitting: Family

## 2011-06-25 ENCOUNTER — Encounter: Payer: Self-pay | Admitting: Family

## 2011-06-25 DIAGNOSIS — R079 Chest pain, unspecified: Secondary | ICD-10-CM

## 2011-06-25 DIAGNOSIS — R609 Edema, unspecified: Secondary | ICD-10-CM

## 2011-06-25 LAB — BASIC METABOLIC PANEL
Calcium: 9.2 mg/dL (ref 8.4–10.5)
GFR: 125.57 mL/min (ref >60.00)
Glucose, Bld: 144 mg/dL — ABNORMAL HIGH (ref 70–99)
Sodium: 142 mEq/L (ref 135–145)

## 2011-06-25 LAB — CBC
MCHC: 34.8 g/dL (ref 30.0–36.0)
MCV: 94.6 fl (ref 78.0–100.0)
RBC: 4.43 Mil/uL (ref 4.22–5.81)

## 2011-06-25 LAB — BRAIN NATRIURETIC PEPTIDE: Pro B Natriuretic peptide (BNP): 51 pg/mL (ref 0.0–100.0)

## 2011-06-25 NOTE — Patient Instructions (Signed)
Chest Pain (Nonspecific) It is often hard to give a specific diagnosis for the cause of chest pain. There is always a chance that your pain could be related to something serious, such as a heart attack or a blood clot in the lungs. You need to follow up with your caregiver for further evaluation. CAUSES   Heartburn.   Pneumonia or bronchitis.   Anxiety and stress.   Inflammation around your heart (pericarditis) or lung (pleuritis or pleurisy).   A blood clot in the lung.   A collapsed lung (pneumothorax). It can develop suddenly on its own (spontaneous pneumothorax) or from injury (trauma) to the chest.  The chest wall is composed of bones, muscles, and cartilage. Any of these can be the source of the pain.  The bones can be bruised by injury.   The muscles or cartilage can be strained by coughing or overwork.   The cartilage can be affected by inflammation and become sore (costochondritis).  DIAGNOSIS  Lab tests or other studies, such as X-rays, an EKG, stress testing, or cardiac imaging, may be needed to find the cause of your pain.  TREATMENT   Treatment depends on what may be causing your chest pain. Treatment may include:   Acid blockers for heartburn.   Anti-inflammatory medicine.   Pain medicine for inflammatory conditions.   Antibiotics if an infection is present.   You may be advised to change lifestyle habits. This includes stopping smoking and avoiding caffeine and chocolate.   You may be advised to keep your head raised (elevated) when sleeping. This reduces the chance of acid going backward from your stomach into your esophagus.   Most of the time, nonspecific chest pain will improve within 2 to 3 days with rest and mild pain medicine.  HOME CARE INSTRUCTIONS   If antibiotics were prescribed, take the full amount even if you start to feel better.   For the next few days, avoid physical activities that bring on chest pain. Continue physical activities as  directed.   Do not smoke cigarettes or drink alcohol until your symptoms are gone.   Only take over-the-counter or prescription medicine for pain, discomfort, or fever as directed by your caregiver.   Follow your caregiver's suggestions for further testing if your chest pain does not go away.   Keep any follow-up appointments you made. If you do not go to an appointment, you could develop lasting (chronic) problems with pain. If there is any problem keeping an appointment, you must call to reschedule.  SEEK MEDICAL CARE IF:   You think you are having problems from the medicine you are taking. Read your medicine instructions carefully.   Your chest pain does not go away, even after treatment.   You develop a rash with blisters on your chest.  SEEK IMMEDIATE MEDICAL CARE IF:   You have increased chest pain or pain that spreads to your arm, neck, jaw, back, or belly (abdomen).   You develop shortness of breath, an increasing cough, or you are coughing up blood.   You have severe back or abdominal pain, feel sick to your stomach (nauseous) or throw up (vomit).   You develop severe weakness, fainting, or chills.   You have an oral temperature above 102 F (38.9 C), not controlled by medicine.  THIS IS AN EMERGENCY. Do not wait to see if the pain will go away. Get medical help at once. Call your local emergency services (911 in [place] Do not drive yourself to   the hospital. MAKE SURE YOU:   Understand these instructions.   Will watch your condition.   Will get help right away if you are not doing well or get worse.  Document Released: 02/11/2005 Document Revised: 01/14/2011 Document Reviewed: 12/08/2007 [employer] Patient Information 2012 [employer]. 

## 2011-06-25 NOTE — Progress Notes (Signed)
Quick Note:  Spoke with pt ______ 

## 2011-06-25 NOTE — Progress Notes (Signed)
Subjective:    Patient ID: Alex Ryan, male    DOB: May 08, 1971, 41 y.o.   MRN: [id number]  HPI Comments: C/o substernal stabbling chest pain radiating to center of back with three episodes over the last couple days lasting only a few seconds at a time with mild nausea. Denies dyspnea, vomiting, diaphoresis, or other associated s/s. Pain goes up to 8/10 only lasts few seconds, currently pain free.  Quit smoking 3 years ago. C/o mild stress with unemployment since 2009     Review of Systems  Constitutional: Negative.   Respiratory: Negative.   Cardiovascular: Positive for chest pain and leg swelling. Negative for palpitations.  Skin: Negative for color change and pallor.   Past Medical History  Diagnosis Date  . Diabetes mellitus   . Hypertension   . Anxiety   . Shingles 2010  . Allergic rhinitis     History   Social History  . Marital Status: Married    Spouse Name: N/A    Number of Children: Y  . Years of Education: N/A   Occupational History  . caterer    Social History Main Topics  . Smoking status: Former Smoker -- 0.3 packs/day for 29 years    Quit date: 08/16/2009  . Smokeless tobacco: Former Neurosurgeon    Quit date: 05/28/2009   Comment: started at age 78.  smoked to 5 or 6 cigs a day.   . Alcohol Use: No  . Drug Use: No  . Sexually Active: Not on file   Other Topics Concern  . Not on file   Social History Narrative  . No narrative on file    Past Surgical History  Procedure Date  . Tonsillectomy 1983    Family History  Problem Relation Age of Onset  . Diabetes Mother   . Transient ischemic attack Father   . Cancer Father     Allergies  Allergen Reactions  . Codeine Hives  . Erythromycin   . Hydrocodone-Homatropine     REACTION: itching    Current Outpatient Prescriptions on File Prior to Visit  Medication Sig Dispense Refill  . aspirin 325 MG tablet Take 325 mg by mouth 2 (two) times daily.        . B Complex-C (SUPER B COMPLEX) TABS Take  1 tablet by mouth daily.        . furosemide (LASIX) 40 MG tablet Take 1 tablet (40 mg total) by mouth daily.  90 tablet  9  . insulin lispro (HUMALOG) 100 UNIT/ML injection Inject 180 Units into the skin daily. 85 units qam and 95 units qpm      . losartan (COZAAR) 50 MG tablet Take 1 tablet (50 mg total) by mouth 2 (two) times daily.  180 tablet  6  . metFORMIN (GLUCOPHAGE) 500 MG tablet Take 500 mg by mouth 2 (two) times daily with a meal. Take 2 tablets       . metoprolol (TOPROL-XL) 100 MG 24 hr tablet Take 1 tablet (100 mg total) by mouth daily.  90 tablet  3  . Multiple Vitamin (MULTIVITAMIN) tablet Take 1 tablet by mouth daily.          BP 130/80  Pulse 82  Wt 250 lb (113.399 kg)  SpO2 98%chart    Objective:   Physical Exam  Constitutional: He is oriented to person, place, and time. He appears well-developed and well-nourished. No distress.  HENT:  Head: Normocephalic.  Cardiovascular: Normal rate, regular rhythm, normal heart sounds  and intact distal pulses.  Exam reveals no gallop and no friction rub.   No murmur heard.      1+ pitting edema lower extremities.   Pulmonary/Chest: Effort normal and breath sounds normal. No respiratory distress. He has no wheezes. He exhibits no tenderness.  Abdominal: Soft. Bowel sounds are normal. He exhibits distension. He exhibits no mass. There is no tenderness. There is no rebound and no guarding.  Genitourinary: Prostate normal.  Neurological: He is alert and oriented to person, place, and time.  Skin: Skin is warm and dry. He is not diaphoretic.  Psychiatric: He has a normal mood and affect. His behavior is normal.          Assessment & Plan:  Assessment: Costochondritis, chest pain  Plan: Cardiology consult, report to ed for any cp or dyspnea, teaching handout provided, labs: BNP, CBC, BMP. Encouraged to RTC as needed, opportunity for questions provided

## 2011-06-25 NOTE — Progress Notes (Deleted)
  Subjective:    Patient ID: Alex Ryan, male    DOB: [birth year], 41 y.o.   MRN: [id number]  HPI    Review of Systems     Objective:   Physical Exam        Assessment & Plan:

## 2011-06-29 ENCOUNTER — Encounter: Payer: Self-pay | Admitting: Family Medicine

## 2011-06-29 ENCOUNTER — Ambulatory Visit (INDEPENDENT_AMBULATORY_CARE_PROVIDER_SITE_OTHER): Payer: 59 | Admitting: Family Medicine

## 2011-06-29 VITALS — BP 132/84 | HR 87 | Temp 98.4°F | Ht 70.0 in | Wt 249.0 lb

## 2011-06-29 DIAGNOSIS — E1140 Type 2 diabetes mellitus with diabetic neuropathy, unspecified: Secondary | ICD-10-CM

## 2011-06-29 DIAGNOSIS — E1149 Type 2 diabetes mellitus with other diabetic neurological complication: Secondary | ICD-10-CM

## 2011-06-29 DIAGNOSIS — Z0000 Encounter for general adult medical examination without abnormal findings: Secondary | ICD-10-CM

## 2011-06-29 DIAGNOSIS — E1142 Type 2 diabetes mellitus with diabetic polyneuropathy: Secondary | ICD-10-CM

## 2011-06-29 NOTE — Progress Notes (Signed)
  Subjective:    Patient ID: Alex Ryan, male    DOB: 30-Mar-1971, 41 y.o.   MRN: [id number]  HPI 41 yr old male for a cpx. He complains of some chronic fatigue, also of burning and tingling and numbness in the hands and feet. His vision is poor, and he is seeing Dr. Luciana Axe for this. Leona finds it almost impossible to work now because of the vision problems and the peripheral neuropathy. Also he was seen here recently for episodic sharp left sided chest pains that last about 5-10 seconds at a time. This was felt to be costochondritis. He feels much better now with no symptoms at all.    Review of Systems  Constitutional: Negative.   HENT: Negative.   Eyes: Positive for visual disturbance. Negative for photophobia, pain, discharge, redness and itching.  Respiratory: Negative.   Cardiovascular: Negative.   Gastrointestinal: Negative.   Genitourinary: Negative.   Musculoskeletal: Negative.   Skin: Negative.   Neurological: Positive for numbness. Negative for dizziness, tremors, seizures, syncope, facial asymmetry, speech difficulty, weakness, light-headedness and headaches.  Hematological: Negative.   Psychiatric/Behavioral: Negative.        Objective:   Physical Exam  Constitutional: He is oriented to person, place, and time. He appears well-developed and well-nourished. No distress.  HENT:  Head: Normocephalic and atraumatic.  Right Ear: External ear normal.  Left Ear: External ear normal.  Nose: Nose normal.  Mouth/Throat: Oropharynx is clear and moist. No oropharyngeal exudate.  Eyes: Conjunctivae and EOM are normal. Pupils are equal, round, and reactive to light. Right eye exhibits no discharge. Left eye exhibits no discharge. No scleral icterus.  Neck: Neck supple. No JVD present. No tracheal deviation present. No thyromegaly present.  Cardiovascular: Normal rate, regular rhythm, normal heart sounds and intact distal pulses.  Exam reveals no gallop and no friction rub.   No  murmur heard. Pulmonary/Chest: Effort normal and breath sounds normal. No respiratory distress. He has no wheezes. He has no rales. He exhibits no tenderness.  Abdominal: Soft. Bowel sounds are normal. He exhibits no distension and no mass. There is no tenderness. There is no rebound and no guarding.  Genitourinary: Rectum normal, prostate normal and penis normal. Guaiac negative stool. No penile tenderness.  Musculoskeletal: Normal range of motion. He exhibits no edema and no tenderness.  Lymphadenopathy:    He has no cervical adenopathy.  Neurological: He is alert and oriented to person, place, and time. He has normal reflexes. No cranial nerve deficit. He exhibits normal muscle tone. Coordination normal.  Skin: Skin is warm and dry. No rash noted. He is not diaphoretic. No erythema. No pallor.  Psychiatric: He has a normal mood and affect. His behavior is normal. Judgment and thought content normal.          Assessment & Plan:  Well exam. The costochondritis seems to have resolved. He already has an appt to see Dr. Jens Som on 07-21-10 so I encouraged him to keep it, due to his risk of cardiac disease.

## 2011-07-08 ENCOUNTER — Institutional Professional Consult (permissible substitution): Payer: 59 | Admitting: Cardiology

## 2011-07-13 ENCOUNTER — Institutional Professional Consult (permissible substitution): Payer: 59 | Admitting: Cardiovascular Disease

## 2011-07-21 ENCOUNTER — Institutional Professional Consult (permissible substitution): Payer: 59 | Admitting: Cardiology

## 2011-07-31 ENCOUNTER — Telehealth: Payer: Self-pay | Admitting: *Deleted

## 2011-07-31 NOTE — Telephone Encounter (Signed)
Pt has been coughing for weeks, and they were told at his church that one of the children there has whooping cough.  He is around the child a lot as they are all friends, and wants to know if he should be checked or tested?

## 2011-08-03 NOTE — Telephone Encounter (Signed)
If he feels ill, he should come in for an OV. If he feels okay otherwise, he could simply wait and see what happens

## 2011-08-03 NOTE — Telephone Encounter (Signed)
Called pt and advised him with info as noted below. Pt said that he is feeling better and did not see the need to sch ov at this time. Will sch ov later on if needed.

## 2011-08-11 ENCOUNTER — Ambulatory Visit (INDEPENDENT_AMBULATORY_CARE_PROVIDER_SITE_OTHER): Payer: 59 | Admitting: Family Medicine

## 2011-08-11 ENCOUNTER — Encounter: Payer: Self-pay | Admitting: Family Medicine

## 2011-08-11 VITALS — BP 134/88 | HR 89 | Temp 98.4°F | Wt 252.0 lb

## 2011-08-11 DIAGNOSIS — J40 Bronchitis, not specified as acute or chronic: Secondary | ICD-10-CM

## 2011-08-11 MED ORDER — AMOXICILLIN-POT CLAVULANATE 875-125 MG PO TABS: 1.0000 | ORAL_TABLET | Freq: Two times a day (BID) | ORAL | Status: AC

## 2011-08-11 MED ORDER — GABAPENTIN 300 MG PO CAPS: 300.0000 mg | ORAL_CAPSULE | Freq: Two times a day (BID) | ORAL | Status: DC

## 2011-08-11 NOTE — Progress Notes (Signed)
  Subjective:    Patient ID: Alex Ryan, male    DOB: 1970/08/17, 41 y.o.   MRN: [id number]  HPI Here for one week of chest congestion and coughing up green sputum. No fever.    Review of Systems  Constitutional: Negative.   HENT: Negative.   Eyes: Negative.   Respiratory: Positive for cough.        Objective:   Physical Exam  Constitutional: He appears well-developed and well-nourished.  HENT:  Right Ear: External ear normal.  Left Ear: External ear normal.  Nose: Nose normal.  Mouth/Throat: Oropharynx is clear and moist. No oropharyngeal exudate.  Eyes: Conjunctivae are normal.  Pulmonary/Chest: Effort normal and breath sounds normal. He has no wheezes. He has no rales.       Scattered rhonchi   Lymphadenopathy:    He has no cervical adenopathy.          Assessment & Plan:  Add Mucinex

## 2011-08-25 ENCOUNTER — Encounter: Payer: Self-pay | Admitting: Cardiology

## 2011-08-25 ENCOUNTER — Ambulatory Visit (INDEPENDENT_AMBULATORY_CARE_PROVIDER_SITE_OTHER): Payer: 59 | Admitting: Cardiology

## 2011-08-25 DIAGNOSIS — E119 Type 2 diabetes mellitus without complications: Secondary | ICD-10-CM

## 2011-08-25 DIAGNOSIS — I10 Essential (primary) hypertension: Secondary | ICD-10-CM

## 2011-08-25 DIAGNOSIS — R079 Chest pain, unspecified: Secondary | ICD-10-CM

## 2011-08-25 MED ORDER — ASPIRIN 325 MG PO TABS: 325.0000 mg | ORAL_TABLET | Freq: Every day | ORAL | Status: DC

## 2011-08-25 NOTE — Patient Instructions (Signed)
Please decrease your ASA to once a day  Your physician has requested that you have a stress echocardiogram. For further information please visit [email or web address] Please follow instruction sheet as given.  Follow up as needed

## 2011-08-25 NOTE — Assessment & Plan Note (Signed)
Management per endocrinology. Given diabetes mellitus is coronary artery disease equivalent he would benefit from a statin long-term. He will discuss this with his primary care physician. Note his liver functions were mildly elevated recently. He will followup with his primary care physician for this issue as well. I have asked him to decrease his aspirin to 325 mg daily.

## 2011-08-25 NOTE — Assessment & Plan Note (Signed)
Patient symptoms atypical. Most likely musculoskeletal. Given 20 years of diabetes mellitus, hypertension and family history plan stress echocardiogram for risk stratification.

## 2011-08-25 NOTE — Assessment & Plan Note (Signed)
Blood pressure controlled. Continue present medications. 

## 2011-08-25 NOTE — Progress Notes (Signed)
HPI: 41 year old male with no prior cardiac history for evaluation of chest pain. Patient states that since November he has had pain intermittently in the left lateral chest area. It increases with certain movements and improved with changing positions. Last several minutes and resolves spontaneously. No associated symptoms. Otherwise he denies dyspnea on exertion, orthopnea, PND, pedal edema, syncope or exertional chest pain.  Current Outpatient Prescriptions  Medication Sig Dispense Refill  . aspirin 325 MG tablet Take 1 tablet (325 mg total) by mouth daily.      . furosemide (LASIX) 40 MG tablet Take 1 tablet (40 mg total) by mouth daily.  90 tablet  9  . gabapentin (NEURONTIN) 300 MG capsule Take 1 capsule (300 mg total) by mouth 2 (two) times daily.  60 capsule  5  . insulin lispro (HUMALOG) 100 UNIT/ML injection Inject into the skin daily. 85 units qam 50 units noon  95 units qpm      . losartan (COZAAR) 100 MG tablet Take 100 mg by mouth 2 (two) times daily.      . metFORMIN (GLUCOPHAGE) 1000 MG tablet Take 1,000 mg by mouth daily.      . metoprolol (TOPROL-XL) 100 MG 24 hr tablet Take 1 tablet (100 mg total) by mouth daily.  90 tablet  3  . DISCONTD: losartan (COZAAR) 50 MG tablet Take 1 tablet (50 mg total) by mouth daily.  30 tablet  11    Allergies  Allergen Reactions  . Codeine Hives  . Erythromycin     Past Medical History  Diagnosis Date  . Hypertension   . Anxiety   . Shingles 2010  . Allergic rhinitis   . Diabetes mellitus     sees Dr. Dorisann Frames   . Retinopathy of both eyes     sees Dr. Luciana Axe   . Neuropathy in diabetes   . OSA (obstructive sleep apnea)     Past Surgical History  Procedure Date  . Tonsillectomy 1983    History   Social History  . Marital Status: Married    Spouse Name: N/A    Number of Children: Y  . Years of Education: N/A   Occupational History  . caterer    Social History Main Topics  . Smoking status: Former Smoker --  0.3 packs/day for 29 years    Quit date: 08/16/2009  . Smokeless tobacco: Former Neurosurgeon    Quit date: 05/28/2009   Comment: started at age 58.  smoked to 5 or 6 cigs a day.   . Alcohol Use: Yes     Occasional  . Drug Use: No  . Sexually Active: Not on file   Other Topics Concern  . Not on file   Social History Narrative  . No narrative on file    Family History  Problem Relation Age of Onset  . Diabetes Mother   . Transient ischemic attack Father   . Cancer Father     ROS: Diabetic neuropathy and retinopathy but no fevers or chills, productive cough, hemoptysis, dysphasia, odynophagia, melena, hematochezia, dysuria, hematuria, rash, seizure activity, orthopnea, PND, pedal edema, claudication. Remaining systems are negative.  Physical Exam:   Blood pressure 126/77, pulse 82, weight 255 lb (115.667 kg).  General:  Well developed/well nourished in NAD Skin warm/dry Patient not depressed No peripheral clubbing Back-normal HEENT-normal/normal eyelids Neck supple/normal carotid upstroke bilaterally; no bruits; no JVD; no thyromegaly chest - CTA/ normal expansion CV - RRR/normal S1 and S2; no rubs or gallops;  PMI  nondisplaced; 1/6 systolic ejection murmur left sternal border.  Abdomen -NT/ND, no HSM, no mass, + bowel sounds, no bruit 2+ femoral pulses, no bruits Ext-no edema, chords, 2+ DP Neuro-grossly nonfocal  ECG NSR with ST changes

## 2011-08-28 ENCOUNTER — Encounter (HOSPITAL_COMMUNITY): Payer: Self-pay

## 2011-08-28 ENCOUNTER — Encounter: Payer: Self-pay | Admitting: Ophthalmology

## 2011-08-28 ENCOUNTER — Other Ambulatory Visit: Payer: Self-pay | Admitting: Ophthalmology

## 2011-08-31 ENCOUNTER — Encounter (HOSPITAL_COMMUNITY): Payer: Self-pay | Admitting: *Deleted

## 2011-08-31 MED ORDER — PHENYLEPHRINE HCL 2.5 % OP SOLN
1.0000 [drp] | OPHTHALMIC | Status: AC | PRN
  Administered 2011-09-01 (×3): 1 [drp] via OPHTHALMIC
  Filled 2011-08-31: qty 3

## 2011-08-31 MED ORDER — GATIFLOXACIN 0.5 % OP SOLN
1.0000 [drp] | Freq: Four times a day (QID) | OPHTHALMIC | Status: AC
  Administered 2011-09-01 (×3): 1 [drp] via OPHTHALMIC
  Filled 2011-08-31: qty 2.5

## 2011-08-31 MED ORDER — KETOROLAC TROMETHAMINE 0.5 % OP SOLN
1.0000 [drp] | OPHTHALMIC | Status: AC
  Administered 2011-09-01: 1 [drp] via OPHTHALMIC
  Filled 2011-08-31: qty 5

## 2011-08-31 MED ORDER — TROPICAMIDE 1 % OP SOLN
1.0000 [drp] | Freq: Once | OPHTHALMIC | Status: AC
  Administered 2011-09-01 (×3): 1 [drp] via OPHTHALMIC
  Filled 2011-08-31: qty 3

## 2011-08-31 MED ORDER — CYCLOPENTOLATE HCL 1 % OP SOLN
1.0000 [drp] | Freq: Once | OPHTHALMIC | Status: AC
  Administered 2011-09-01 (×3): 1 [drp] via OPHTHALMIC
  Filled 2011-08-31: qty 2

## 2011-08-31 NOTE — Progress Notes (Addendum)
PCP- Dr Abran Cantor--.  Cardiologist Dr Jens Som.  Had labs and EKG at Dr Laurice Record in Feb 2013.  Saw Dr Jens Som in March 2013.  I noted in Dr Ludwig Clarks note that  Pt had a slight increase in Liver function, will do a CMET instead of BMET  Had Sleep study 06/2010 has a CPAP but does not use it "The hose has a hole in it".  "It never fit right."

## 2011-09-01 ENCOUNTER — Ambulatory Visit (HOSPITAL_COMMUNITY): Payer: 59

## 2011-09-01 ENCOUNTER — Encounter (HOSPITAL_COMMUNITY)
Admission: RE | Disposition: A | Discharge status: Home / Self Care | Payer: Self-pay | Source: Ambulatory Visit | Attending: Ophthalmology

## 2011-09-01 ENCOUNTER — Ambulatory Visit (HOSPITAL_COMMUNITY)
Admission: RE | Admit: 2011-09-01 | Discharge: 2011-09-01 | Disposition: A | Discharge status: Home / Self Care | Payer: 59 | Source: Ambulatory Visit | Attending: Ophthalmology | Admitting: Ophthalmology

## 2011-09-01 ENCOUNTER — Encounter (HOSPITAL_COMMUNITY): Payer: Self-pay | Admitting: Certified Registered"

## 2011-09-01 ENCOUNTER — Ambulatory Visit (HOSPITAL_COMMUNITY): Payer: 59 | Admitting: Certified Registered"

## 2011-09-01 ENCOUNTER — Encounter (HOSPITAL_COMMUNITY): Payer: Self-pay | Admitting: *Deleted

## 2011-09-01 DIAGNOSIS — G4730 Sleep apnea, unspecified: Secondary | ICD-10-CM | POA: Insufficient documentation

## 2011-09-01 DIAGNOSIS — K449 Diaphragmatic hernia without obstruction or gangrene: Secondary | ICD-10-CM | POA: Insufficient documentation

## 2011-09-01 DIAGNOSIS — Z794 Long term (current) use of insulin: Secondary | ICD-10-CM | POA: Insufficient documentation

## 2011-09-01 DIAGNOSIS — H35379 Puckering of macula, unspecified eye: Secondary | ICD-10-CM | POA: Insufficient documentation

## 2011-09-01 DIAGNOSIS — H4311 Vitreous hemorrhage, right eye: Secondary | ICD-10-CM

## 2011-09-01 DIAGNOSIS — E1139 Type 2 diabetes mellitus with other diabetic ophthalmic complication: Secondary | ICD-10-CM | POA: Insufficient documentation

## 2011-09-01 DIAGNOSIS — I10 Essential (primary) hypertension: Secondary | ICD-10-CM | POA: Insufficient documentation

## 2011-09-01 DIAGNOSIS — E119 Type 2 diabetes mellitus without complications: Secondary | ICD-10-CM

## 2011-09-01 DIAGNOSIS — H4310 Vitreous hemorrhage, unspecified eye: Secondary | ICD-10-CM | POA: Insufficient documentation

## 2011-09-01 DIAGNOSIS — E11359 Type 2 diabetes mellitus with proliferative diabetic retinopathy without macular edema: Secondary | ICD-10-CM | POA: Insufficient documentation

## 2011-09-01 HISTORY — PX: PARS PLANA VITRECTOMY W/ SCLERAL BUCKLE: SHX2171

## 2011-09-01 HISTORY — DX: Acute upper respiratory infection, unspecified: J06.9

## 2011-09-01 HISTORY — DX: Headache: R51

## 2011-09-01 HISTORY — DX: Personal history of other diseases of the digestive system: Z87.19

## 2011-09-01 HISTORY — DX: Cellulitis, unspecified: L03.90

## 2011-09-01 HISTORY — DX: Cardiac murmur, unspecified: R01.1

## 2011-09-01 HISTORY — DX: Unspecified background retinopathy: H35.00

## 2011-09-01 LAB — COMPREHENSIVE METABOLIC PANEL
AST: 34 U/L (ref 0–37)
Albumin: 3.6 g/dL (ref 3.5–5.2)
Chloride: 107 mEq/L (ref 96–112)
Creatinine, Ser: 0.74 mg/dL (ref 0.50–1.35)
Potassium: 4.8 mEq/L (ref 3.5–5.1)
Total Bilirubin: 0.6 mg/dL (ref 0.3–1.2)
Total Protein: 6.2 g/dL (ref 6.0–8.3)

## 2011-09-01 LAB — SURGICAL PCR SCREEN
MRSA, PCR: NEGATIVE
Staphylococcus aureus: NEGATIVE

## 2011-09-01 LAB — CBC
Hemoglobin: 14.3 g/dL (ref 13.0–17.0)
MCHC: 34.6 g/dL (ref 30.0–36.0)
RBC: 4.47 MIL/uL (ref 4.22–5.81)

## 2011-09-01 SURGERY — PARS PLANA VITRECTOMY WITH LASER FOR MACULAR HOLE
Anesthesia: General | Site: Eye | Laterality: Right | Wound class: Clean

## 2011-09-01 SURGERY — PARS PLANA VITRECTOMY WITH 25 GAUGE
Anesthesia: General | Laterality: Right

## 2011-09-01 MED ORDER — DEXAMETHASONE SODIUM PHOSPHATE 10 MG/ML IJ SOLN
INTRAMUSCULAR | Status: DC | PRN
  Administered 2011-09-01: 10 mg

## 2011-09-01 MED ORDER — MUPIROCIN 2 % EX OINT
TOPICAL_OINTMENT | Freq: Once | CUTANEOUS | Status: AC
  Administered 2011-09-01: 06:00:00 via NASAL
  Filled 2011-09-01: qty 22

## 2011-09-01 MED ORDER — BSS PLUS IO SOLN
INTRAOCULAR | Status: DC | PRN
  Administered 2011-09-01: 1 via INTRAOCULAR

## 2011-09-01 MED ORDER — MUPIROCIN 2 % EX OINT
TOPICAL_OINTMENT | CUTANEOUS | Status: AC
  Filled 2011-09-01: qty 22

## 2011-09-01 MED ORDER — BSS IO SOLN
INTRAOCULAR | Status: DC | PRN
  Administered 2011-09-01: 500 mL via INTRAOCULAR

## 2011-09-01 MED ORDER — PROPOFOL 10 MG/ML IV EMUL
INTRAVENOUS | Status: DC | PRN
  Administered 2011-09-01: 170 mg via INTRAVENOUS

## 2011-09-01 MED ORDER — GLYCOPYRROLATE 0.2 MG/ML IJ SOLN
INTRAMUSCULAR | Status: DC | PRN
  Administered 2011-09-01: 0.4 mg via INTRAVENOUS

## 2011-09-01 MED ORDER — HYPROMELLOSE (GONIOSCOPIC) 2.5 % OP SOLN
OPHTHALMIC | Status: DC | PRN
  Administered 2011-09-01: 1 [drp] via OPHTHALMIC

## 2011-09-01 MED ORDER — BSS IO SOLN
INTRAOCULAR | Status: DC | PRN
  Administered 2011-09-01: 15 mL via INTRAOCULAR

## 2011-09-01 MED ORDER — ROCURONIUM BROMIDE 100 MG/10ML IV SOLN
INTRAVENOUS | Status: DC | PRN
  Administered 2011-09-01: 50 mg via INTRAVENOUS

## 2011-09-01 MED ORDER — DROPERIDOL 2.5 MG/ML IJ SOLN
INTRAMUSCULAR | Status: DC | PRN
  Administered 2011-09-01: 0.625 mg via INTRAVENOUS

## 2011-09-01 MED ORDER — SUFENTANIL CITRATE 50 MCG/ML IV SOLN
INTRAVENOUS | Status: DC | PRN
  Administered 2011-09-01: 20 ug via INTRAVENOUS

## 2011-09-01 MED ORDER — MIDAZOLAM HCL 5 MG/5ML IJ SOLN
INTRAMUSCULAR | Status: DC | PRN
  Administered 2011-09-01: 2 mg via INTRAVENOUS

## 2011-09-01 MED ORDER — EPINEPHRINE HCL 1 MG/ML IJ SOLN
INTRAMUSCULAR | Status: DC | PRN
  Administered 2011-09-01: .3 mg

## 2011-09-01 MED ORDER — NEOSTIGMINE METHYLSULFATE 1 MG/ML IJ SOLN
INTRAMUSCULAR | Status: DC | PRN
  Administered 2011-09-01: 3 mg via INTRAVENOUS

## 2011-09-01 MED ORDER — ONDANSETRON HCL 4 MG/2ML IJ SOLN: 4.0000 mg | Freq: Once | INTRAMUSCULAR | Status: DC | PRN

## 2011-09-01 MED ORDER — ONDANSETRON HCL 4 MG/2ML IJ SOLN
INTRAMUSCULAR | Status: DC | PRN
  Administered 2011-09-01: 4 mg via INTRAVENOUS

## 2011-09-01 MED ORDER — LACTATED RINGERS IV SOLN
INTRAVENOUS | Status: DC | PRN
  Administered 2011-09-01: 07:00:00 via INTRAVENOUS

## 2011-09-01 MED ORDER — EPHEDRINE SULFATE 50 MG/ML IJ SOLN
INTRAMUSCULAR | Status: DC | PRN
  Administered 2011-09-01 (×2): 10 mg via INTRAVENOUS

## 2011-09-01 MED ORDER — HYDROMORPHONE HCL PF 1 MG/ML IJ SOLN: 0.2500 mg | INTRAMUSCULAR | Status: DC | PRN

## 2011-09-01 MED ORDER — CEFAZOLIN SODIUM 1-5 GM-% IV SOLN
INTRAVENOUS | Status: AC
  Filled 2011-09-01: qty 100

## 2011-09-01 MED ORDER — LIDOCAINE HCL (CARDIAC) 20 MG/ML IV SOLN
INTRAVENOUS | Status: DC | PRN
  Administered 2011-09-01: 100 mg via INTRAVENOUS

## 2011-09-01 SURGICAL SUPPLY — 58 items
ACCESSORY FRAGMATOME (MISCELLANEOUS) IMPLANT
APL SRG 3 HI ABS STRL LF PLS (MISCELLANEOUS)
APPLICATOR COTTON TIP 6IN STRL (MISCELLANEOUS) ×2 IMPLANT
APPLICATOR DR MATTHEWS STRL (MISCELLANEOUS) IMPLANT
BLADE MVR KNIFE 20G (BLADE) ×1 IMPLANT
CANNULA ANT CHAM MAIN (OPHTHALMIC RELATED) IMPLANT
CANNULA FLEX TIP 25G (CANNULA) ×1 IMPLANT
CLOTH BEACON ORANGE TIMEOUT ST (SAFETY) ×2 IMPLANT
CORDS BIPOLAR (ELECTRODE) IMPLANT
COVER SURGICAL LIGHT HANDLE (MISCELLANEOUS) ×1 IMPLANT
DRAPE OPHTHALMIC 77X100 STRL (CUSTOM PROCEDURE TRAY) ×2 IMPLANT
ERASER HMR WETFIELD 23G BP (MISCELLANEOUS) IMPLANT
FILTER BLUE MILLIPORE (MISCELLANEOUS) IMPLANT
FILTER STRAW FLUID ASPIR (MISCELLANEOUS) IMPLANT
GAS OPHTHALMIC (MISCELLANEOUS) IMPLANT
GLOVE BIO SURGEON STRL SZ7.5 (GLOVE) ×1 IMPLANT
GLOVE ECLIPSE 6.5 STRL STRAW (GLOVE) ×1 IMPLANT
GLOVE SS BIOGEL STRL SZ 8 (GLOVE) ×1 IMPLANT
GLOVE SUPERSENSE BIOGEL SZ 8 (GLOVE) ×1
GLOVE SURG SS PI 6.5 STRL IVOR (GLOVE) ×1 IMPLANT
GOWN EXTRA PROTECTION XL (GOWNS) ×2 IMPLANT
GOWN STRL NON-REIN LRG LVL3 (GOWN DISPOSABLE) ×3 IMPLANT
KIT PERFLUORON PROCEDURE 5ML (MISCELLANEOUS) IMPLANT
KIT ROOM TURNOVER OR (KITS) ×2 IMPLANT
KNIFE CRESCENT 2.5 55 ANG (BLADE) IMPLANT
LENS BIOM SUPER VIEW SET DISP (OPHTHALMIC RELATED) ×2 IMPLANT
MARKER SKIN DUAL TIP RULER LAB (MISCELLANEOUS) ×2 IMPLANT
MASK EYE SHIELD (GAUZE/BANDAGES/DRESSINGS) ×1 IMPLANT
NDL 18GX1X1/2 (RX/OR ONLY) (NEEDLE) IMPLANT
NDL 25GX 5/8IN NON SAFETY (NEEDLE) ×1 IMPLANT
NDL HYPO 30X.5 LL (NEEDLE) IMPLANT
NEEDLE 18GX1X1/2 (RX/OR ONLY) (NEEDLE) IMPLANT
NEEDLE 25GX 5/8IN NON SAFETY (NEEDLE) ×2 IMPLANT
NEEDLE HYPO 30X.5 LL (NEEDLE) IMPLANT
OIL SILICONE OPHTHALMIC ADAPTO (Ophthalmic Related) ×1 IMPLANT
PACK VITRECTOMY CUSTOM (CUSTOM PROCEDURE TRAY) ×2 IMPLANT
PACK VITRECTOMY PIC MCHSVP (PACKS) ×2 IMPLANT
PAD ARMBOARD 7.5X6 YLW CONV (MISCELLANEOUS) ×4 IMPLANT
PAD EYE OVAL STERILE LF (GAUZE/BANDAGES/DRESSINGS) ×1 IMPLANT
PROBE COHERENT CURVED (MISCELLANEOUS) IMPLANT
PROBE DIRECTIONAL LASER (MISCELLANEOUS) ×1 IMPLANT
REPL STRA BRUSH NDL (NEEDLE) IMPLANT
REPL STRA BRUSH NEEDLE (NEEDLE) IMPLANT
RESERVOIR BACK FLUSH (MISCELLANEOUS) IMPLANT
ROLLS DENTAL (MISCELLANEOUS) ×2 IMPLANT
SET FLUID INJECTOR (SET/KITS/TRAYS/PACK) IMPLANT
STOCKINETTE IMPERVIOUS 9X36 MD (GAUZE/BANDAGES/DRESSINGS) ×4 IMPLANT
STOPCOCK 4 WAY LG BORE MALE ST (IV SETS) IMPLANT
SUT ETHILON 10 0 CS140 6 (SUTURE) IMPLANT
SUT MERSILENE 5 0 RD 1 DA (SUTURE) ×2 IMPLANT
SUT PROLENE 10 0 CIF 4 DA (SUTURE) IMPLANT
SUT VICRYL 7 0 TG140 8 (SUTURE) IMPLANT
SYR 30ML SLIP (SYRINGE) IMPLANT
SYR 5ML LL (SYRINGE) IMPLANT
SYR TB 1ML LUER SLIP (SYRINGE) ×2 IMPLANT
TAPE SURG TRANSPORE 1 IN (GAUZE/BANDAGES/DRESSINGS) IMPLANT
TAPE SURGICAL TRANSPORE 1 IN (GAUZE/BANDAGES/DRESSINGS) ×1
TOWEL OR 17X24 6PK STRL BLUE (TOWEL DISPOSABLE) ×4 IMPLANT

## 2011-09-01 NOTE — Brief Op Note (Signed)
09/01/2011  8:53 AM  PATIENT:  Alex Ryan  41 y.o. male  PRE-OPERATIVE DIAGNOSIS:  Vitreous Hemorrhage right eye, with proliferative diabetic retinopathy  POST-OPERATIVE DIAGNOSIS:  Vitreous Hemorrhage right eye, with traction retinal detachment, and progressive proliferative diabetic retinopathy  PROCEDURE:  Procedure(s) (LRB): PARS PLANA VITRECTOMY WITH ENDO LASER FOR diabetic retinopathy, proliferative,  (Right) EYE, WITH MEMBRANE PEEL, AND INJECTION OF SILICONE OIL, 5000 CS   SURGEON:  Surgeon(s) and Role:    * Edmon Crape, MD - Primary  PHYSICIAN ASSISTANT:   ASSISTANTS: none   ANESTHESIA:   general  EBL:     BLOOD ADMINISTERED:none  DRAINS: none   LOCAL MEDICATIONS USED:  NONE  SPECIMEN:  No Specimen  DISPOSITION OF SPECIMEN:  N/A  COUNTS:  YES  TOURNIQUET:  * No tourniquets in log *  DICTATION: .Other Dictation: Dictation Number [id number]  PLAN OF CARE: Discharge to home after PACU  PATIENT DISPOSITION:  PACU - hemodynamically stable.   Delay start of Pharmacological VTE agent (>24hrs) due to surgical blood loss or risk of bleeding: not applicable

## 2011-09-01 NOTE — H&P (Signed)
Alex Ryan is an 41 y.o. male.   Chief Complaint: VISION LOSS FROM DENSE VITREOUS HEMORRHAGE RIGHT EYE HPI: ACTIVE PROLIFERATIVE DIABETIC RETINOPATHY WITH DENSE VITREOUS HEMORRHAGE RIGHT EYE  Past Medical History  Diagnosis Date  . Hypertension   . Anxiety   . Shingles 2010  . Allergic rhinitis   . Diabetes mellitus     sees Dr. Dorisann Ryan   . Retinopathy of both eyes     sees Dr. Luciana Ryan   . Neuropathy in diabetes   . Heart murmur   . Cellulitis     legs - hx  . OSA (obstructive sleep apnea)     CPAP- not wearing  . Recurrent upper respiratory infection (URI)     frequent bronchcitis  . H/O hiatal hernia     pt thinks so  . Headache     had migraine- not in years  . Retinopathy     right eye    Past Surgical History  Procedure Date  . Tonsillectomy 1983  . Eye surgery 2012    Cataract with lens  Right    Family History  Problem Relation Age of Onset  . Diabetes Mother   . Transient ischemic attack Father   . Cancer Father   . Anesthesia problems Neg Hx    Social History:  reports that he quit smoking about 2 years ago. He quit smokeless tobacco use about 2 years ago. He reports that he drinks alcohol. He reports that he does not use illicit drugs.  Allergies:  Allergies  Allergen Reactions  . Codeine Hives  . Erythromycin Nausea And Vomiting  . Other     Chg wipes. itching  . Soap Itching    "dial soap"    Medications Prior to Admission  Medication Dose Route Frequency Provider Last Rate Last Dose  . cyclopentolate (CYCLODRYL,CYCLOGYL) 1 % ophthalmic solution 1 drop  1 drop Right Eye Once Edmon Crape, MD   1 drop at 09/01/11 0648  . gatifloxacin (ZYMAXID) 0.5 % ophthalmic drops 1 drop  1 drop Right Eye QID Edmon Crape, MD   1 drop at 09/01/11 0650  . ketorolac (ACULAR) 0.5 % ophthalmic solution 1 drop  1 drop Right Eye On Call to OR Edmon Crape, MD   1 drop at 09/01/11 0651  . mupirocin ointment (BACTROBAN) 2 %   Nasal Once Edmon Crape, MD       . phenylephrine (MYDFRIN) 2.5 % ophthalmic solution 1 drop  1 drop Right Eye PRN Edmon Crape, MD   1 drop at 09/01/11 0650  . tropicamide (MYDRIACYL) 1 % ophthalmic solution 1 drop  1 drop Right Eye Once Edmon Crape, MD   1 drop at 09/01/11 9604   Medications Prior to Admission  Medication Sig Dispense Refill  . aspirin 325 MG tablet Take 1 tablet (325 mg total) by mouth daily.      . furosemide (LASIX) 40 MG tablet Take 1 tablet (40 mg total) by mouth daily.  90 tablet  9  . gabapentin (NEURONTIN) 300 MG capsule Take 1 capsule (300 mg total) by mouth 2 (two) times daily.  60 capsule  5  . insulin lispro (HUMALOG) 100 UNIT/ML injection Inject 50-95 Units into the skin 3 (three) times daily. 85 units qam 50 units noon  95 units qpm      . losartan (COZAAR) 100 MG tablet Take 100 mg by mouth 2 (two) times daily.      Marland Kitchen  metFORMIN (GLUCOPHAGE) 1000 MG tablet Take 1,000 mg by mouth daily.      . metoprolol (TOPROL-XL) 100 MG 24 hr tablet Take 1 tablet (100 mg total) by mouth daily.  90 tablet  3    Results for orders placed during the hospital encounter of 09/01/11 (from the past 48 hour(s))  GLUCOSE, CAPILLARY     Status: Abnormal   Collection Time   09/01/11  6:20 AM      Component Value Range Comment   Glucose-Capillary 155 (*) 70 - 99 (mg/dL)    Dg Chest 2 View  6/96/2952  *RADIOLOGY REPORT*  Clinical Data: Preoperative chest radiograph for vitrectomy.  CHEST - 2 VIEW  Comparison: None  Findings: The lungs are relatively well-aerated.  Mild bibasilar opacities likely reflect atelectasis.  There is no evidence of pleural effusion or pneumothorax.  The heart is normal in size; the mediastinal contour is within normal limits.  No acute osseous abnormalities are seen.  IMPRESSION: Mild bibasilar airspace opacities likely reflect atelectasis; lungs otherwise clear.  Original Report Authenticated By: Tonia Ghent, M.D.    Review of Systems  Constitutional: Negative.   HENT: Negative.    Eyes: Positive for blurred vision.  Respiratory: Negative.   Cardiovascular: Negative.   Gastrointestinal: Negative.   Genitourinary: Negative.   Musculoskeletal: Negative.   Skin: Negative.   Neurological: Negative.   Endo/Heme/Allergies: Negative.   Psychiatric/Behavioral: Negative.   All other systems reviewed and are negative.    Blood pressure 143/87, pulse 73, temperature 97.9 F (36.6 C), temperature source Oral, resp. rate 18, height 5\' 11"  (1.803 m), weight 113.399 kg (250 lb), SpO2 98.00%. Physical Exam  Constitutional: He appears well-developed and well-nourished.  HENT:  Head: Normocephalic.  Eyes: Conjunctivae are normal. Pupils are equal, round, and reactive to light.    Cardiovascular: Normal rate and regular rhythm.   Respiratory: Effort normal and breath sounds normal.  GI: Soft.  Neurological: He is alert. He has normal reflexes.  Skin: Skin is warm and dry.  Psychiatric: He has a normal mood and affect. His behavior is normal. Judgment and thought content normal.     Assessment/Plan ACTIVE PROLIFERATIVE DIABETIC RETINOPATHY RIGHT EYE, WITH DENSE VITREOUS HEMORRHAGE  RIGHT EYE.   PLAN IS FOR VITRECTOMY WITH ENDOLASER AND MEMBRANE PEEL OD, UNDER GENERAL ANESTHESIA  Alex Ryan A 09/01/2011, 7:43 AM

## 2011-09-01 NOTE — Anesthesia Preprocedure Evaluation (Addendum)
Anesthesia Evaluation  Patient identified by MRN, date of birth, ID band Patient awake    Reviewed: Allergy & Precautions, H&P , NPO status , Patient's Chart, lab work & pertinent test results  History of Anesthesia Complications Negative for: history of anesthetic complications  Airway Mallampati: III      Dental  (+) Teeth Intact   Pulmonary sleep apnea and Continuous Positive Airway Pressure Ventilation ,  breath sounds clear to auscultation        Cardiovascular hypertension, Pt. on medications and Pt. on home beta blockers + Valvular Problems/Murmurs Rhythm:Regular     Neuro/Psych    GI/Hepatic negative GI ROS, Neg liver ROS, hiatal hernia,   Endo/Other  Diabetes mellitus-, Type 1, Insulin Dependent  Renal/GU negative Renal ROS  negative genitourinary   Musculoskeletal negative musculoskeletal ROS (+)   Abdominal   Peds  Hematology negative hematology ROS (+)   Anesthesia Other Findings   Reproductive/Obstetrics                          Anesthesia Physical Anesthesia Plan  ASA: III  Anesthesia Plan: General   Post-op Pain Management:    Induction: Intravenous  Airway Management Planned: Oral ETT  Additional Equipment:   Intra-op Plan:   Post-operative Plan: Extubation in OR  Informed Consent:   Dental advisory given  Plan Discussed with: CRNA, Anesthesiologist and Surgeon  Anesthesia Plan Comments:         Anesthesia Quick Evaluation

## 2011-09-01 NOTE — Transfer of Care (Signed)
Immediate Anesthesia Transfer of Care Note  Patient: Alex Ryan  Procedure(s) Performed: Procedure(s) (LRB): PARS PLANA VITRECTOMY WITH LASER FOR MACULAR HOLE (Right)  Patient Location: PACU  Anesthesia Type: General  Level of Consciousness: awake, oriented, sedated, patient cooperative and responds to stimulation  Airway & Oxygen Therapy: Patient Spontanous Breathing and Patient connected to nasal cannula oxygen  Post-op Assessment: Report given to PACU RN, Post -op Vital signs reviewed and stable and Patient moving all extremities  Post vital signs: Reviewed and stable  Complications: No apparent anesthesia complications

## 2011-09-01 NOTE — Anesthesia Postprocedure Evaluation (Signed)
  Anesthesia Post-op Note  Patient: Alex Ryan  Procedure(s) Performed: Procedure(s) (LRB): PARS PLANA VITRECTOMY WITH LASER FOR MACULAR HOLE (Right)  Patient Location: PACU  Anesthesia Type: General  Level of Consciousness: awake, alert  and oriented  Airway and Oxygen Therapy: Patient Spontanous Breathing  Post-op Pain: none  Post-op Assessment: Post-op Vital signs reviewed, Patient's Cardiovascular Status Stable, Respiratory Function Stable, Patent Airway and Pain level controlled  Post-op Vital Signs: Reviewed and stable  Complications: No apparent anesthesia complications

## 2011-09-01 NOTE — Progress Notes (Signed)
D\c lt nsl   Cath intact site u

## 2011-09-01 NOTE — Op Note (Signed)
NAMEKWAMANE, WHACK NO.:  [id number]  MEDICAL RECORD NO.:  [id number]  LOCATION:  [facility]                         FACILITY:  [facility]  PHYSICIAN:  Jillyn Hidden A. Deshawn Skelley, M.D.   DATE OF BIRTH:  1970/08/23  DATE OF PROCEDURE:  09/01/2011 DATE OF DISCHARGE:  09/01/2011                              OPERATIVE REPORT   PREOPERATIVE DIAGNOSES: 1. Dense vitreous hemorrhage, right eye - with vision loss. 2. Proliferative diabetic retinopathy, right eye.  POSTOPERATIVE DIAGNOSES: 1. Dense vitreous hemorrhage, right eye - with vision loss. 2. Proliferative diabetic retinopathy, right eye. 3. Status post recent Avastin injection to limit postoperative     hemorrhage. 4. 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 5. Progressive diabetic retinopathy.  PROCEDURE:  Posterior vitrectomy with membrane peel - 25-gauge plus, with Endolaser panphotocoagulation as well as injection of vitreous substitute 5000 [place] silicon oil of the right eye.  SURGEON:  Alford Highland. Maura Braaten, M.D.  INDICATION FOR PROCEDURE:  The patient is a 41 year old male who has progressive proliferative diabetic retinopathy, dense vitreous hemorrhage, active neovascularization of the optic nerve,  macular region, and tractional changes of epiretinal membrane.  He understands this and intent to remove the tractional forces as well as to clear the vitreous opacification.  He understands the risks of anesthesia including the recurrence, death including but not limited to hemorrhage, infection, scarring, need for another surgery, no change in vision, loss of vision, progressive disease despite intervention.  Appropriate signed consent was obtained.  The patient was taken to the operating room.  In the operating room, appropriate monitors followed by mild sedation.  General anesthesia was instituted without difficulty.  The right ocular region was identified and operative time-out with the entire  staff.  Thereafter, the right eye was sterilely prepped and draped in the usual ophthalmic fashion.  Lid speculum was applied.  A 25-gauge trocar was placed in the inferotemporal quadrant, infusion turned on.  Superior trocar was applied.  Core vitrectomy was then begun.  Peripheral vitreous cone entered 360 degrees.  Tractional changes along the macular region of the optic nerve were dense in a plaque-like fashion.  Posterior detachments were transected, so the inferior traction was all released.  Most of the epiretinal tissues at the optic nerve in the superior removed and inferior to optic nerve, small remnants were in plaque and notable nasal to optic nerve and over the macular region.  Felt like these areas were too adherent and too intimate with the retina to safely remove using membrane peel techniques in order to prevent the retinal holes from developing.  Hemostasis was otherwise spontaneous.  Endolaser photocoagulation placed 360 degrees.  Fluid air exchange carried out and further Endolaser photocoagulation could be completed.  I elected and chose to place silicone oil as a long-term vitreous substitute for patient's ambulatory return, but also tend to stabilize the entire ocular structure and to help to more favorably and do some regression of proliferative diabetic retinopathy.  The patient in fact will one day need a removal, but potentially at that time could have the remaining small epiretinal tissue of the macular region removed.  At this time, the superonasal trocar was  then removed, conjunctiva opened, and the sclerotomy closed directly with 7-0 Vicryl suture.  The infusion temporarily had been maintained as air.  Superonasal sclerotomy was then opened with 20-gauge MVR as the conjunctiva had been opened. An air-silicone oil exchange completed.  Appropriate silicone oil fill was obtained.  The superotemporal sclerotomy closed with 7-0 Vicryl. Finally, the infusion was  removed, similarly closed with 7-0 Vicryl suture.  Conjunctiva closed with 7-0 Vicryl suture.  No complications occurred.  Subconjunctival Decadron applied.  The intra-ocular pressure assessed and found to be adequate.  Sterile patch and Fox shield were applied.  The patient was awaken from anesthesia and taken to PACU in good and stable condition without difficulty.     Alford Highland Guiselle Mian, M.D.     GAR/MEDQ  D:  09/01/2011  T:  09/01/2011  Job:  [id number]

## 2011-09-02 ENCOUNTER — Encounter (HOSPITAL_COMMUNITY): Payer: Self-pay | Admitting: Ophthalmology

## 2011-09-10 ENCOUNTER — Ambulatory Visit (HOSPITAL_COMMUNITY): Payer: 59

## 2011-09-18 ENCOUNTER — Other Ambulatory Visit: Payer: Self-pay | Admitting: *Deleted

## 2011-09-18 MED ORDER — ALCOHOL PREP 70 % PADS: MEDICATED_PAD | Status: DC

## 2011-09-18 NOTE — Telephone Encounter (Signed)
R'cd refill request from [facility] for refill of Alcohol Prep pads.

## 2011-11-03 ENCOUNTER — Encounter: Payer: Self-pay | Admitting: Family Medicine

## 2011-11-03 ENCOUNTER — Ambulatory Visit (INDEPENDENT_AMBULATORY_CARE_PROVIDER_SITE_OTHER): Payer: 59 | Admitting: Family Medicine

## 2011-11-03 VITALS — BP 160/98 | HR 98 | Temp 98.6°F | Wt 248.0 lb

## 2011-11-03 DIAGNOSIS — N390 Urinary tract infection, site not specified: Secondary | ICD-10-CM

## 2011-11-03 LAB — POCT URINALYSIS DIPSTICK
Glucose, UA: 250
Nitrite, UA: NEGATIVE
Spec Grav, UA: >=1.03
Urobilinogen, UA: 0.2
pH, UA: 6

## 2011-11-03 MED ORDER — CIPROFLOXACIN HCL 500 MG PO TABS: 500.0000 mg | ORAL_TABLET | Freq: Two times a day (BID) | ORAL | Status: AC

## 2011-11-03 NOTE — Addendum Note (Signed)
Addended by: Aniceto Boss A on: 11/03/2011 11:07 AM   Modules accepted: Orders

## 2011-11-03 NOTE — Progress Notes (Signed)
  Subjective:    Patient ID: Alex Ryan, male    DOB: 1970-05-29, 41 y.o.   MRN: [id number]  HPI Here for 4 days of urgency to urinate and burning. No back pain or nausea or fever. His urine is very foul smelling. Drinking plenty of water. He has never had this before.    Review of Systems  Constitutional: Negative.   Respiratory: Negative.   Cardiovascular: Negative.   Gastrointestinal: Negative.   Genitourinary: Positive for dysuria, urgency, frequency and difficulty urinating. Negative for hematuria and flank pain.       Objective:   Physical Exam  Constitutional: He appears well-developed and well-nourished.  Abdominal: Soft. Bowel sounds are normal. He exhibits no distension and no mass. There is no tenderness. There is no rebound and no guarding.          Assessment & Plan:  Treat with Cipro. Await culture results

## 2011-11-06 LAB — URINE CULTURE: Colony Count: >=100000

## 2011-11-06 NOTE — Progress Notes (Signed)
Quick Note:  I spoke with pt ______ 

## 2011-11-23 ENCOUNTER — Other Ambulatory Visit: Payer: Self-pay | Admitting: Ophthalmology

## 2011-11-24 ENCOUNTER — Encounter (HOSPITAL_COMMUNITY): Payer: Self-pay | Admitting: Pharmacy Technician

## 2011-12-03 ENCOUNTER — Encounter (HOSPITAL_COMMUNITY): Payer: Self-pay | Admitting: *Deleted

## 2011-12-03 NOTE — Progress Notes (Addendum)
I notified Dr Shelva Majestic office to have Dr Luciana Axe to sign orders.  I spoke with [facility].

## 2011-12-04 ENCOUNTER — Ambulatory Visit (HOSPITAL_COMMUNITY): Payer: 59

## 2011-12-04 ENCOUNTER — Encounter (HOSPITAL_COMMUNITY): Payer: Self-pay | Admitting: Certified Registered"

## 2011-12-04 ENCOUNTER — Ambulatory Visit: Admit: 2011-12-04 | Payer: Self-pay | Admitting: Ophthalmology

## 2011-12-04 ENCOUNTER — Encounter (HOSPITAL_COMMUNITY)
Admission: RE | Disposition: A | Discharge status: Home / Self Care | Payer: Self-pay | Source: Ambulatory Visit | Attending: Ophthalmology

## 2011-12-04 ENCOUNTER — Encounter (HOSPITAL_COMMUNITY): Payer: Self-pay | Admitting: *Deleted

## 2011-12-04 ENCOUNTER — Ambulatory Visit (HOSPITAL_COMMUNITY)
Admission: RE | Admit: 2011-12-04 | Discharge: 2011-12-04 | Disposition: A | Discharge status: Home / Self Care | Payer: 59 | Source: Ambulatory Visit | Attending: Ophthalmology | Admitting: Ophthalmology

## 2011-12-04 ENCOUNTER — Ambulatory Visit (HOSPITAL_COMMUNITY): Payer: 59 | Admitting: Certified Registered"

## 2011-12-04 DIAGNOSIS — G4733 Obstructive sleep apnea (adult) (pediatric): Secondary | ICD-10-CM | POA: Insufficient documentation

## 2011-12-04 DIAGNOSIS — J45909 Unspecified asthma, uncomplicated: Secondary | ICD-10-CM | POA: Insufficient documentation

## 2011-12-04 DIAGNOSIS — H4310 Vitreous hemorrhage, unspecified eye: Secondary | ICD-10-CM | POA: Insufficient documentation

## 2011-12-04 DIAGNOSIS — E1139 Type 2 diabetes mellitus with other diabetic ophthalmic complication: Secondary | ICD-10-CM | POA: Insufficient documentation

## 2011-12-04 DIAGNOSIS — K449 Diaphragmatic hernia without obstruction or gangrene: Secondary | ICD-10-CM | POA: Insufficient documentation

## 2011-12-04 DIAGNOSIS — F411 Generalized anxiety disorder: Secondary | ICD-10-CM | POA: Insufficient documentation

## 2011-12-04 DIAGNOSIS — H3340 Traction detachment of retina, unspecified eye: Secondary | ICD-10-CM | POA: Insufficient documentation

## 2011-12-04 DIAGNOSIS — E11359 Type 2 diabetes mellitus with proliferative diabetic retinopathy without macular edema: Secondary | ICD-10-CM | POA: Insufficient documentation

## 2011-12-04 DIAGNOSIS — I10 Essential (primary) hypertension: Secondary | ICD-10-CM | POA: Insufficient documentation

## 2011-12-04 DIAGNOSIS — H3342 Traction detachment of retina, left eye: Secondary | ICD-10-CM

## 2011-12-04 DIAGNOSIS — R51 Headache: Secondary | ICD-10-CM | POA: Insufficient documentation

## 2011-12-04 DIAGNOSIS — H35379 Puckering of macula, unspecified eye: Secondary | ICD-10-CM | POA: Insufficient documentation

## 2011-12-04 HISTORY — PX: PARS PLANA VITRECTOMY: SHX2166

## 2011-12-04 HISTORY — DX: Chronic kidney disease, unspecified: N18.9

## 2011-12-04 HISTORY — DX: Cystitis, unspecified without hematuria: N30.90

## 2011-12-04 HISTORY — DX: Unspecified asthma, uncomplicated: J45.909

## 2011-12-04 LAB — GLUCOSE, CAPILLARY
Glucose-Capillary: 152 mg/dL — ABNORMAL HIGH (ref 70–99)
Glucose-Capillary: 141 mg/dL — ABNORMAL HIGH (ref 70–99)

## 2011-12-04 LAB — BASIC METABOLIC PANEL
BUN: 11 mg/dL (ref 6–23)
Calcium: 9 mg/dL (ref 8.4–10.5)
Creatinine, Ser: 0.62 mg/dL (ref 0.50–1.35)
GFR calc Af Amer: >90 mL/min (ref >90)

## 2011-12-04 LAB — CBC
HCT: 39.1 % (ref 39.0–52.0)
MCHC: 34.3 g/dL (ref 30.0–36.0)
MCV: 92.2 fL (ref 78.0–100.0)
Platelets: 204 10*3/uL (ref 150–400)
RDW: 12.7 % (ref 11.5–15.5)
WBC: 7.3 10*3/uL (ref 4.0–10.5)

## 2011-12-04 LAB — SURGICAL PCR SCREEN
MRSA, PCR: NEGATIVE
Staphylococcus aureus: NEGATIVE

## 2011-12-04 SURGERY — PARS PLANA VITRECTOMY WITH 25 GAUGE
Anesthesia: General | Site: Eye | Laterality: Left | Wound class: Clean

## 2011-12-04 SURGERY — PARS PLANA VITRECTOMY WITH 25 GAUGE
Anesthesia: General | Laterality: Left

## 2011-12-04 MED ORDER — BSS IO SOLN
INTRAOCULAR | Status: AC
  Filled 2011-12-04: qty 15

## 2011-12-04 MED ORDER — GLYCOPYRROLATE 0.2 MG/ML IJ SOLN
INTRAMUSCULAR | Status: DC | PRN
  Administered 2011-12-04: .8 mg via INTRAVENOUS

## 2011-12-04 MED ORDER — GATIFLOXACIN 0.5 % OP SOLN
1.0000 [drp] | OPHTHALMIC | Status: AC | PRN
  Administered 2011-12-04 (×3): 1 [drp] via OPHTHALMIC
  Filled 2011-12-04: qty 2.5

## 2011-12-04 MED ORDER — EPINEPHRINE HCL 1 MG/ML IJ SOLN
INTRAOCULAR | Status: DC | PRN
  Administered 2011-12-04: 08:00:00

## 2011-12-04 MED ORDER — CYCLOPENTOLATE HCL 1 % OP SOLN
1.0000 [drp] | OPHTHALMIC | Status: AC | PRN
  Administered 2011-12-04 (×3): 1 [drp] via OPHTHALMIC
  Filled 2011-12-04: qty 2

## 2011-12-04 MED ORDER — ROCURONIUM BROMIDE 100 MG/10ML IV SOLN
INTRAVENOUS | Status: DC | PRN
  Administered 2011-12-04: 5 mg via INTRAVENOUS
  Administered 2011-12-04: 10 mg via INTRAVENOUS
  Administered 2011-12-04: 30 mg via INTRAVENOUS

## 2011-12-04 MED ORDER — HYDROMORPHONE HCL PF 1 MG/ML IJ SOLN: 0.2500 mg | INTRAMUSCULAR | Status: DC | PRN

## 2011-12-04 MED ORDER — LIDOCAINE HCL 2 % IJ SOLN
INTRAMUSCULAR | Status: AC
  Filled 2011-12-04: qty 1

## 2011-12-04 MED ORDER — LIDOCAINE HCL 2 % IJ SOLN
INTRAMUSCULAR | Status: DC | PRN
  Administered 2011-12-04: 10 mL

## 2011-12-04 MED ORDER — MIDAZOLAM HCL 5 MG/5ML IJ SOLN
INTRAMUSCULAR | Status: DC | PRN
  Administered 2011-12-04: 2 mg via INTRAVENOUS

## 2011-12-04 MED ORDER — ONDANSETRON HCL 4 MG/2ML IJ SOLN
INTRAMUSCULAR | Status: DC | PRN
  Administered 2011-12-04: 4 mg via INTRAVENOUS

## 2011-12-04 MED ORDER — EPINEPHRINE HCL 1 MG/ML IJ SOLN
INTRAMUSCULAR | Status: AC
  Filled 2011-12-04: qty 1

## 2011-12-04 MED ORDER — DEXAMETHASONE SODIUM PHOSPHATE 10 MG/ML IJ SOLN
INTRAMUSCULAR | Status: DC | PRN
  Administered 2011-12-04: 10 mg

## 2011-12-04 MED ORDER — LIDOCAINE HCL (CARDIAC) 20 MG/ML IV SOLN
INTRAVENOUS | Status: DC | PRN
  Administered 2011-12-04: 80 mg via INTRAVENOUS

## 2011-12-04 MED ORDER — PROMETHAZINE HCL 25 MG/ML IJ SOLN: 6.2500 mg | INTRAMUSCULAR | Status: DC | PRN

## 2011-12-04 MED ORDER — EPHEDRINE SULFATE 50 MG/ML IJ SOLN
INTRAMUSCULAR | Status: DC | PRN
  Administered 2011-12-04 (×2): 10 mg via INTRAVENOUS

## 2011-12-04 MED ORDER — 0.9 % SODIUM CHLORIDE (POUR BTL) OPTIME
TOPICAL | Status: DC | PRN
  Administered 2011-12-04: 1000 mL

## 2011-12-04 MED ORDER — FENTANYL CITRATE 0.05 MG/ML IJ SOLN
INTRAMUSCULAR | Status: DC | PRN
  Administered 2011-12-04 (×2): 50 ug via INTRAVENOUS

## 2011-12-04 MED ORDER — SUCCINYLCHOLINE CHLORIDE 20 MG/ML IJ SOLN
INTRAMUSCULAR | Status: DC | PRN
  Administered 2011-12-04: 100 mg via INTRAVENOUS

## 2011-12-04 MED ORDER — NEOSTIGMINE METHYLSULFATE 1 MG/ML IJ SOLN
INTRAMUSCULAR | Status: DC | PRN
  Administered 2011-12-04: 5 mg via INTRAVENOUS

## 2011-12-04 MED ORDER — PROPOFOL 10 MG/ML IV EMUL
INTRAVENOUS | Status: DC | PRN
  Administered 2011-12-04: 180 mg via INTRAVENOUS

## 2011-12-04 MED ORDER — MEPERIDINE HCL 25 MG/ML IJ SOLN: 6.2500 mg | INTRAMUSCULAR | Status: DC | PRN

## 2011-12-04 MED ORDER — HYPROMELLOSE (GONIOSCOPIC) 2.5 % OP SOLN
OPHTHALMIC | Status: DC | PRN
  Administered 2011-12-04: 1 [drp] via OPHTHALMIC

## 2011-12-04 MED ORDER — PHENYLEPHRINE HCL 2.5 % OP SOLN
1.0000 [drp] | OPHTHALMIC | Status: AC | PRN
  Administered 2011-12-04 (×3): 1 [drp] via OPHTHALMIC
  Filled 2011-12-04: qty 3

## 2011-12-04 MED ORDER — DEXAMETHASONE SODIUM PHOSPHATE 10 MG/ML IJ SOLN
INTRAMUSCULAR | Status: AC
  Filled 2011-12-04: qty 1

## 2011-12-04 MED ORDER — SODIUM CHLORIDE 0.9 % IV SOLN
INTRAVENOUS | Status: DC | PRN
  Administered 2011-12-04 (×2): via INTRAVENOUS

## 2011-12-04 MED ORDER — BSS PLUS IO SOLN
INTRAOCULAR | Status: AC
  Filled 2011-12-04: qty 500

## 2011-12-04 MED ORDER — HYPROMELLOSE (GONIOSCOPIC) 2.5 % OP SOLN
OPHTHALMIC | Status: AC
  Filled 2011-12-04: qty 15

## 2011-12-04 MED ORDER — MUPIROCIN 2 % EX OINT
TOPICAL_OINTMENT | Freq: Two times a day (BID) | CUTANEOUS | Status: DC
  Administered 2011-12-04: 07:00:00 via NASAL
  Filled 2011-12-04 (×2): qty 22

## 2011-12-04 SURGICAL SUPPLY — 45 items
ACCESSORY FRAGMATOME (MISCELLANEOUS) IMPLANT
APPLICATOR COTTON TIP 6IN STRL (MISCELLANEOUS) ×2 IMPLANT
BLADE MVR KNIFE 19G (BLADE) ×1 IMPLANT
CANNULA FLEX TIP 25G (CANNULA) ×1 IMPLANT
CLOTH BEACON ORANGE TIMEOUT ST (SAFETY) ×2 IMPLANT
DRAPE OPHTHALMIC 77X100 STRL (CUSTOM PROCEDURE TRAY) ×2 IMPLANT
FILTER BLUE MILLIPORE (MISCELLANEOUS) IMPLANT
GAS OPHTHALMIC (MISCELLANEOUS) IMPLANT
GLOVE BIOGEL PI IND STRL 7.5 (GLOVE) IMPLANT
GLOVE BIOGEL PI INDICATOR 7.5 (GLOVE) ×1
GLOVE SS BIOGEL STRL SZ 6.5 (GLOVE) IMPLANT
GLOVE SS BIOGEL STRL SZ 8.5 (GLOVE) ×1 IMPLANT
GLOVE SUPERSENSE BIOGEL SZ 6.5 (GLOVE) ×1
GLOVE SUPERSENSE BIOGEL SZ 8.5 (GLOVE) ×1
GOWN STRL NON-REIN LRG LVL3 (GOWN DISPOSABLE) ×3 IMPLANT
ILLUMINATOR ENDO 25GA (MISCELLANEOUS) ×2 IMPLANT
KIT BASIN OR (CUSTOM PROCEDURE TRAY) ×2 IMPLANT
KIT ROOM TURNOVER OR (KITS) ×2 IMPLANT
LENS BIOM SUPER VIEW SET DISP (OPHTHALMIC RELATED) ×2 IMPLANT
MASK EYE SHIELD (GAUZE/BANDAGES/DRESSINGS) ×1 IMPLANT
MICROPICK 25G (MISCELLANEOUS)
NDL 25GX 5/8IN NON SAFETY (NEEDLE) IMPLANT
NDL BLUNT 18X1 FOR OR ONLY (NEEDLE) IMPLANT
NEEDLE 25GX 5/8IN NON SAFETY (NEEDLE) ×2 IMPLANT
NEEDLE BLUNT 18X1 FOR OR ONLY (NEEDLE) ×2 IMPLANT
NS IRRIG 1000ML POUR BTL (IV SOLUTION) ×2 IMPLANT
OIL SILICONE OPHTHALMIC ADAPTO (Ophthalmic Related) ×1 IMPLANT
PACK VITRECTOMY CUSTOM (CUSTOM PROCEDURE TRAY) ×2 IMPLANT
PAD ARMBOARD 7.5X6 YLW CONV (MISCELLANEOUS) ×3 IMPLANT
PAD EYE OVAL STERILE LF (GAUZE/BANDAGES/DRESSINGS) ×1 IMPLANT
PAK VITRECTOMY PIK 25 GA (OPHTHALMIC RELATED) ×2 IMPLANT
PICK MICROPICK 25G (MISCELLANEOUS) IMPLANT
PROBE DIRECTIONAL LASER (MISCELLANEOUS) ×1 IMPLANT
ROLLS DENTAL (MISCELLANEOUS) ×2 IMPLANT
SCRAPER DIAMOND 25GA (OPHTHALMIC RELATED) IMPLANT
STOCKINETTE IMPERVIOUS 9X36 MD (GAUZE/BANDAGES/DRESSINGS) ×4 IMPLANT
STOPCOCK 4 WAY LG BORE MALE ST (IV SETS) IMPLANT
SUT PROLENE 10 0 CIF 4 DA (SUTURE) IMPLANT
SUT VICRYL 7 0 TG140 8 (SUTURE) IMPLANT
SYR TB 1ML LUER SLIP (SYRINGE) ×1 IMPLANT
TAPE SURG TRANSPORE 1 IN (GAUZE/BANDAGES/DRESSINGS) IMPLANT
TAPE SURGICAL TRANSPORE 1 IN (GAUZE/BANDAGES/DRESSINGS) ×1
TOWEL OR 17X24 6PK STRL BLUE (TOWEL DISPOSABLE) ×4 IMPLANT
WATER STERILE IRR 1000ML POUR (IV SOLUTION) ×2 IMPLANT
WIPE INSTRUMENT VISIWIPE 73X73 (MISCELLANEOUS) ×2 IMPLANT

## 2011-12-04 NOTE — Anesthesia Postprocedure Evaluation (Signed)
  Anesthesia Post-op Note  Patient: Alex Ryan  Procedure(s) Performed: Procedure(s) (LRB): PARS PLANA VITRECTOMY WITH 25 GAUGE (Left) PANRETINAL LASER NEONATAL (Left) MEMBRANE PEEL (Left)  Patient Location: PACU  Anesthesia Type: General  Level of Consciousness: awake  Airway and Oxygen Therapy: Patient Spontanous Breathing  Post-op Pain: mild  Post-op Assessment: Post-op Vital signs reviewed  Post-op Vital Signs: stable  Complications: No apparent anesthesia complications

## 2011-12-04 NOTE — Preoperative (Signed)
Beta Blockers   Reason not to administer Beta Blockers:Not Applicable, 12/04/11 at 06:15

## 2011-12-04 NOTE — Brief Op Note (Signed)
12/04/2011  10:06 AM  PATIENT:  Alex Ryan  41 y.o. male  PRE-OPERATIVE DIAGNOSIS:  VITREOUS HEMORRHAGE LEFT EYE, PROLIFERATIVE DIABETIC RETINOPATHY  POST-OPERATIVE DIAGNOSIS:  VITREOUS HEMORRHAGE LEFT EYE, PROLIFERATIVE DIABETIC RETINOPATHY, TRACTION RETINAL, MACULAR, DETACHMENT  PROCEDURE:  Procedure(s) (LRB): PARS PLANA VITRECTOMY WITH 25 GAUGE (Left) PANRETINAL endo LASER  (Left) MEMBRANE PEEL (Left), REPAIR OF COMPLEX TRACTION RETINAL DETACHMENT, And INJECTION OF VITREOUS SUBSTITUTE SILICONE OIL 5000CS  SURGEON:  Surgeon(s) and Role:    * Edmon Crape, MD - Primary  PHYSICIAN ASSISTANT:   ASSISTANTS: none   ANESTHESIA:   general  EBL:  Total I/O In: 500 [I.V.:500] Out: -   BLOOD ADMINISTERED:none  DRAINS: none   LOCAL MEDICATIONS USED:  NONE  SPECIMEN:  No Specimen  DISPOSITION OF SPECIMEN:  N/A  COUNTS:  YES  TOURNIQUET:  * No tourniquets in log *  DICTATION: .Other Dictation: Dictation Number [id number]  PLAN OF CARE: Discharge to home after PACU  PATIENT DISPOSITION:  PACU - hemodynamically stable.   Delay start of Pharmacological VTE agent (>24hrs) due to surgical blood loss or risk of bleeding: not applicable

## 2011-12-04 NOTE — Anesthesia Procedure Notes (Signed)
Procedure Name: Intubation Date/Time: 12/04/2011 8:52 AM Performed by: Jefm Miles E Pre-anesthesia Checklist: Patient identified, Timeout performed, Emergency Drugs available, Suction available and Patient being monitored Patient Re-evaluated:Patient Re-evaluated prior to inductionOxygen Delivery Method: Circle system utilized Preoxygenation: Pre-oxygenation with 100% oxygen Intubation Type: IV induction and Cricoid Pressure applied Ventilation: Mask ventilation without difficulty and Oral airway inserted - appropriate to patient size Laryngoscope Size: Mac and 3 Grade View: Grade I Tube type: Oral Tube size: 7.5 mm Number of attempts: 1 Airway Equipment and Method: Stylet Placement Confirmation: ETT inserted through vocal cords under direct vision,  breath sounds checked- equal and bilateral and positive ETCO2 Secured at: 24 cm Tube secured with: Tape Dental Injury: Teeth and Oropharynx as per pre-operative assessment

## 2011-12-04 NOTE — Anesthesia Preprocedure Evaluation (Addendum)
Anesthesia Evaluation  Patient identified by MRN, date of birth, ID band Patient awake    Reviewed: Allergy & Precautions, H&P , NPO status , Patient's Chart, lab work & pertinent test results, reviewed documented beta blocker date and time   Airway Mallampati: I TM Distance: >3 FB Neck ROM: Full    Dental  (+) Teeth Intact, Dental Advisory Given and Chipped,    Pulmonary asthma , sleep apnea and Continuous Positive Airway Pressure Ventilation ,  breath sounds clear to auscultation        Cardiovascular hypertension, Pt. on home beta blockers + Valvular Problems/Murmurs Rhythm:Regular Rate:Normal     Neuro/Psych  Headaches, Anxiety  Neuromuscular disease    GI/Hepatic hiatal hernia,   Endo/Other  Type 2, Oral Hypoglycemic Agents  Renal/GU      Musculoskeletal   Abdominal (+) + obese,   Peds  Hematology   Anesthesia Other Findings   Reproductive/Obstetrics                          Anesthesia Physical Anesthesia Plan  ASA: II  Anesthesia Plan: General   Post-op Pain Management:    Induction: Intravenous  Airway Management Planned: Oral ETT  Additional Equipment:   Intra-op Plan:   Post-operative Plan: Extubation in OR  Informed Consent: I have reviewed the patients History and Physical, chart, labs and discussed the procedure including the risks, benefits and alternatives for the proposed anesthesia with the patient or authorized representative who has indicated his/her understanding and acceptance.   Dental advisory given  Plan Discussed with: Surgeon and CRNA  Anesthesia Plan Comments:         Anesthesia Quick Evaluation

## 2011-12-04 NOTE — H&P (Signed)
Alex Ryan is an 41 y.o. male.   Chief Complaint: vision loss left eye HPI: LONG HISTORY OF POORLY CONTROLLED DIABETES MELLITUS AND NEGLECTED DIABETIC RETINOPATHY LEFT EYE, FOR PLANNED VITRECTOMY OS, TO STABILIZE RETINOPATHY, AND INTEND TO IMPROVE VISUAL FUNCTIONING.  Past Medical History  Diagnosis Date  . Hypertension   . Anxiety   . Shingles 2010  . Allergic rhinitis   . Diabetes mellitus     sees Dr. Dorisann Frames   . Retinopathy of both eyes     sees Dr. Luciana Axe   . Neuropathy in diabetes   . Cellulitis     legs - hx  . OSA (obstructive sleep apnea)     CPAP- not wearing  . Recurrent upper respiratory infection (URI)     frequent bronchcitis  . H/O hiatal hernia     pt thinks so  . Headache     had migraine- not in years  . Retinopathy     right eye  . Asthma   . Chronic kidney disease   . Bladder infection     in June -Was on Cipro  . Heart murmur     Followed by Dr Jens Som    Past Surgical History  Procedure Date  . Tonsillectomy 1983  . Eye surgery 2012    Cataract with lens  Right  . Pars plana vitrectomy w/ scleral buckle 09/01/2011    Procedure: PARS PLANA VITRECTOMY WITH LASER FOR MACULAR HOLE;  Surgeon: Edmon Crape, MD;  Location: [facility] OR;  Service: Ophthalmology;  Laterality: Right;  Pars Plana Vitrectomy with Laser and Membrane Peel; Insertion Silicone Oil    Family History  Problem Relation Age of Onset  . Diabetes Mother   . Transient ischemic attack Father   . Cancer Father   . Anesthesia problems Neg Hx    Social History:  reports that he quit smoking about 2 years ago. He has quit using smokeless tobacco. He reports that he does not drink alcohol or use illicit drugs.  Allergies:  Allergies  Allergen Reactions  . Codeine Hives    Hyper  . Erythromycin Nausea And Vomiting  . Other     Pre-op cleaning wipes:. itching  . Soap Itching    "dial soap"    No prescriptions prior to admission    No results found for this or any  previous visit (from the past 48 hour(s)). No results found.  Review of Systems  Constitutional: Negative.   HENT: Negative.   Eyes: Positive for blurred vision.  Respiratory: Negative.   Cardiovascular: Negative.   Gastrointestinal: Negative.   Genitourinary: Negative.   Musculoskeletal: Negative.   Skin: Negative.   Neurological: Negative.   Endo/Heme/Allergies: Negative.   Psychiatric/Behavioral: Negative.     Height 5\' 10"  (1.778 m), weight 112.492 kg (248 lb). Physical Exam  Constitutional: He is oriented to person, place, and time. Vital signs are normal. He appears well-developed and well-nourished.  HENT:  Head: Normocephalic and atraumatic.  Eyes: EOM and lids are normal.  Fundoscopic exam:      The left eye shows hemorrhage.         DENSE VITREOUS HEMORRHAGE LEFT EYE, WITH EPIRETINAL MEMBRANE, AND PROLIFERATIVE DIABETIC RETINOPATHY,, VISION 20/125  Neck: Trachea normal and normal range of motion.  Cardiovascular: Normal rate and normal pulses.   Respiratory: Effort normal.  GI: Soft.  Musculoskeletal: Normal range of motion.  Neurological: He is alert and oriented to person, place, and time. He has normal reflexes.  Skin: Skin is warm and dry.  Psychiatric: He has a normal mood and affect. His behavior is normal. Judgment and thought content normal. Cognition and memory are normal.     Assessment/Plan DENSE VITREOUS HEMORRHAGE, PRERETINAL FIBROSIS, EPIRETINAL MEMBRANE LEFT EYE, WITH DIABETIC TRACTION RETINAL DETACHMENT LEFT EYE.   PLANNED VITRECTOMY, LASER MEMBRANE PEEL OS. Karolee Meloni A 12/04/2011, 6:50 AM

## 2011-12-04 NOTE — Transfer of Care (Signed)
Immediate Anesthesia Transfer of Care Note  Patient: Alex Ryan  Procedure(s) Performed: Procedure(s) (LRB): PARS PLANA VITRECTOMY WITH 25 GAUGE (Left) PANRETINAL LASER NEONATAL (Left) MEMBRANE PEEL (Left)  Patient Location: PACU  Anesthesia Type: General  Level of Consciousness: awake, alert  and oriented  Airway & Oxygen Therapy: Patient Spontanous Breathing and Patient connected to face mask oxygen  Post-op Assessment: Report given to PACU RN  Post vital signs: Reviewed and stable  Complications: No apparent anesthesia complications

## 2011-12-05 NOTE — Op Note (Signed)
NAMECOMER, Alex NO.:  [id number]  MEDICAL RECORD NO.:  [id number]  LOCATION:  [facility]                         FACILITY:  [facility]  PHYSICIAN:  Jillyn Hidden A. Lean Fayson, M.D.   DATE OF BIRTH:  1971/01/16  DATE OF PROCEDURE:  12/04/2011 DATE OF DISCHARGE:  12/04/2011                              OPERATIVE REPORT   PREOPERATIVE DIAGNOSES: 1. Dense vitreous hemorrhage, left eye, nonclearing. 2. Proliferative diabetic retinopathy, left eye, uncontrolled.  POSTOPERATIVE DIAGNOSES: 1. Dense vitreous hemorrhage, left eye, nonclearing. 2. Proliferative diabetic retinopathy, left eye, uncontrolled. 3. Tractional detachment-macular region, left eye.  SURGEON:  Alex Highland. [name], MD  ANESTHESIA:  General endotracheal anesthesia.  INDICATION FOR PROCEDURE:  The patient is a 41 year old man who has profound vision loss in the left eye, basis of epiretinal membrane, tractional detachment in the macular region, dense vitreous hemorrhage in the left eye.  He has had previous photocoagulation, but has dense nonclearing vitreous hemorrhage with central macular detachment.  The patient understands this was an attempt to alleviate the medial opacity, but also to remove the complete epiretinal tissue along the macular region with tractional detachment.  The patient understands the risks of anesthesia including the occurrence of death, but also to the eye from the underlying condition as well surgical repair including, but not limited to hemorrhage, infection, scar, need for another surgery, change in vision, loss of vision, and progressive disease despite intervention.  DESCRIPTION OF PROCEDURE:  Appropriate signed consent was obtained, the patient was taken to the operating room.  In the operating room, appropriate monitoring was followed by mild sedation.  Appropriate site selection was confirmed the operating site with the operating staff is the left eye and thereafter, under general  anesthesia, left periocular region was sterilely prepped and draped in usual ophthalmic fashion. Lid speculum was applied.  A 25-gauge trocar was placed in the inferotemporal quadrant and infusion turned on.  Superior trocar was applied.  Core traction was then began.  Peripheral vitreous cone was entered and circumcised 360 degrees with some dense adherence nasally in this left eye.  Preretinal hemorrhage was aspirated.  There was a large plaque of fibrous disease overlying the optic nerve in the macular region and covering it entirely.  Dissection of this area was carried out using 25-gauge vitrectomy shaving techniques and internal peeling from in a "toco" fashion.  Membrane portion was used and elevated the membrane gently off the optic nerve.  No vascular complications were noted.  The remainder of the dense fibrous disease was gently and carefully removed in a bimanual fashion off the macular region.  Fluid air exchange completed.  Hemostasis was obtained.  Subretinal fluid in the macular region was aspirated through a small atrophic hole along the superotemporal arcade.  Reaccumulation of preretinal and subretinal fluid was aspirated over 15-20 minutes.  This allowed for the macula flattened.  Endolaser photocoagulation was placed 360 degrees posteriorly to the equator as there was excellent PRP previously. Endolaser photocoagulation also was used to assist hemostasis superonasally and also superotemporally.  The small atrophic hole was closed with endolaser.  No other complications occurred.  Because of the severe macular disease in the  atrophic appearance to the macular region, the previous plaques were removed and for hemostasis, I elected to use an intra-ocular tamponade with long-acting silicone oil to prevent reaccumulation of preretinal hemorrhage and scar tissue.  At this time, superonasal sclerotomy was then enlarged after the trocar being removed. Conjunctiva was opened.   A 19-gauge MVR blade used to open this area. Thereafter, an air-silicone oil exchange carried out passively. Superonasal sclerotomy was closed with 7-0 Vicryl suture after appropriate fill had been obtained.  Superotemporal sclerotomy was closed with 7-0 Vicryl suture as was finally the infusion removed and closed with 7-0 Vicryl.  Conjunctiva was then closed with 7-0 Vicryl. At this time, subconjunctival Decadron applied.  Intraocular pressure was assessed and found to be adequate.  Excellent oil fill had been obtained.  No complications occurred.  Sterile patch and Fox shield applied.  The patient was taken to the PACU in good, stable condition.     Alex Ryan, M.D.     GAR/MEDQ  D:  12/04/2011  T:  12/05/2011  Job:  [id number]

## 2011-12-07 ENCOUNTER — Encounter (HOSPITAL_COMMUNITY): Payer: Self-pay | Admitting: Ophthalmology

## 2011-12-10 ENCOUNTER — Ambulatory Visit (INDEPENDENT_AMBULATORY_CARE_PROVIDER_SITE_OTHER): Payer: 59 | Admitting: Family Medicine

## 2011-12-10 ENCOUNTER — Encounter: Payer: Self-pay | Admitting: Family Medicine

## 2011-12-10 VITALS — BP 150/82 | HR 84 | Temp 98.3°F | Wt 256.0 lb

## 2011-12-10 DIAGNOSIS — I10 Essential (primary) hypertension: Secondary | ICD-10-CM

## 2011-12-10 DIAGNOSIS — L509 Urticaria, unspecified: Secondary | ICD-10-CM

## 2011-12-10 MED ORDER — AMLODIPINE BESYLATE 5 MG PO TABS: 5.0000 mg | ORAL_TABLET | Freq: Every day | ORAL | Status: DC

## 2011-12-10 NOTE — Progress Notes (Signed)
  Subjective:    Patient ID: Alex Ryan, male    DOB: 20-Jul-1970, 41 y.o.   MRN: [id number]  HPI Here for elevated BPs and for 2 days of itchy red spots on the forearms. His BP has been in the 150-160 range systolic, even though he feels fine. No SOB or HAs.    Review of Systems  Constitutional: Negative.   Respiratory: Negative.   Cardiovascular: Negative.   Skin: Positive for rash.       Objective:   Physical Exam  Constitutional: He appears well-developed and well-nourished.  Cardiovascular: Normal rate, regular rhythm, normal heart sounds and intact distal pulses.   Pulmonary/Chest: Effort normal and breath sounds normal.  Skin:       Scattered red tender nodules on both inner forearms          Assessment & Plan:  He appears to be having an urticarial reaction to something. Try taking Zyrtec bid for several days. We will add Norvasc 5 mg daily to his meds. Recheck one month

## 2011-12-14 ENCOUNTER — Other Ambulatory Visit: Payer: Self-pay | Admitting: Ophthalmology

## 2011-12-21 ENCOUNTER — Ambulatory Visit (INDEPENDENT_AMBULATORY_CARE_PROVIDER_SITE_OTHER): Payer: 59 | Admitting: Family Medicine

## 2011-12-21 ENCOUNTER — Encounter: Payer: Self-pay | Admitting: Family Medicine

## 2011-12-21 VITALS — BP 136/78 | HR 89 | Temp 98.2°F | Wt 256.0 lb

## 2011-12-21 DIAGNOSIS — L259 Unspecified contact dermatitis, unspecified cause: Secondary | ICD-10-CM

## 2011-12-21 MED ORDER — METHYLPREDNISOLONE 4 MG PO KIT: PACK | ORAL | Status: AC

## 2011-12-21 NOTE — Progress Notes (Signed)
  Subjective:    Patient ID: Alex Ryan, male    DOB: Sep 05, 1970, 40 y.o.   MRN: [id number]  HPI Here for 2 days of itchy rashes on both arms. He was seen here a few weeks ago for hives, and these resolved with antihistamines. He determined that this was a reaction to Zest soap, and he has switched back to his old brand.    Review of Systems  Constitutional: Negative.   Respiratory: Negative.   Cardiovascular: Negative.   Skin: Positive for rash.       Objective:   Physical Exam  Constitutional: He appears well-developed and well-nourished.  Skin:       Several patches of red vesicles on both forearms           Assessment & Plan:  Recheck prn

## 2011-12-28 ENCOUNTER — Ambulatory Visit: Payer: 59

## 2011-12-31 ENCOUNTER — Encounter (HOSPITAL_COMMUNITY): Payer: Self-pay | Admitting: Respiratory Therapy

## 2012-01-04 ENCOUNTER — Encounter: Payer: Self-pay | Admitting: Family Medicine

## 2012-01-04 ENCOUNTER — Ambulatory Visit (INDEPENDENT_AMBULATORY_CARE_PROVIDER_SITE_OTHER): Payer: 59 | Admitting: Family Medicine

## 2012-01-04 VITALS — BP 132/70 | HR 84 | Temp 98.5°F | Wt 263.0 lb

## 2012-01-04 DIAGNOSIS — R600 Localized edema: Secondary | ICD-10-CM

## 2012-01-04 DIAGNOSIS — R609 Edema, unspecified: Secondary | ICD-10-CM

## 2012-01-04 DIAGNOSIS — E119 Type 2 diabetes mellitus without complications: Secondary | ICD-10-CM

## 2012-01-04 DIAGNOSIS — I10 Essential (primary) hypertension: Secondary | ICD-10-CM

## 2012-01-04 MED ORDER — FUROSEMIDE 40 MG PO TABS: 40.0000 mg | ORAL_TABLET | Freq: Two times a day (BID) | ORAL | Status: DC

## 2012-01-04 NOTE — Progress Notes (Signed)
  Subjective:    Patient ID: Alex Ryan, male    DOB: Jun 14, 1970, 41 y.o.   MRN: [id number]  HPI Here for swelling in the legs which started 2 weeks ago. He ran out of Lasix about that time. No SOB. His rashes are clearing up.    Review of Systems  Constitutional: Negative.   Respiratory: Negative.   Cardiovascular: Positive for leg swelling. Negative for chest pain and palpitations.       Objective:   Physical Exam  Constitutional: He appears well-developed and well-nourished.  Cardiovascular: Normal rate, regular rhythm, normal heart sounds and intact distal pulses.   Pulmonary/Chest: Effort normal and breath sounds normal.  Musculoskeletal:       2+ edema to both lower legs           Assessment & Plan:  Refilled Lasix but increase the dose to 80 mg a day.

## 2012-01-14 SURGERY — PARS PLANA VITRECTOMY WITH 25 GAUGE
Anesthesia: General | Laterality: Left

## 2012-01-15 ENCOUNTER — Ambulatory Visit (HOSPITAL_COMMUNITY): Admission: RE | Admit: 2012-01-15 | Payer: 59 | Source: Ambulatory Visit | Admitting: Ophthalmology

## 2012-01-15 ENCOUNTER — Encounter (HOSPITAL_COMMUNITY): Admission: RE | Payer: Self-pay | Source: Ambulatory Visit

## 2012-01-15 SURGERY — PARS PLANA VITRECTOMY WITH 25 GAUGE
Anesthesia: LOCAL | Laterality: Right

## 2012-02-16 ENCOUNTER — Ambulatory Visit (INDEPENDENT_AMBULATORY_CARE_PROVIDER_SITE_OTHER): Payer: Self-pay | Admitting: Family Medicine

## 2012-02-16 DIAGNOSIS — E119 Type 2 diabetes mellitus without complications: Secondary | ICD-10-CM

## 2012-02-16 NOTE — Progress Notes (Signed)
Patient presents today for 3 month DM follow-up as part of the employer-sponsored Link to Wellness program.  Medications, insulin regimen, and glucose readings have been reviewed.  I have also discussed with patient lifestyle interventions such as diet and exercise.  Details of this visit can be found in [employer] documenting program through [employer] [facility]).  Patient has set a series of personal goals to work on and will follow-up in 3 months for further review of DM.

## 2012-02-29 ENCOUNTER — Other Ambulatory Visit: Payer: Self-pay | Admitting: Family Medicine

## 2012-03-04 ENCOUNTER — Ambulatory Visit (INDEPENDENT_AMBULATORY_CARE_PROVIDER_SITE_OTHER): Payer: 59

## 2012-03-04 DIAGNOSIS — Z23 Encounter for immunization: Secondary | ICD-10-CM

## 2012-03-10 ENCOUNTER — Encounter: Payer: Self-pay | Admitting: Family Medicine

## 2012-03-10 NOTE — Progress Notes (Signed)
Patient ID: [name], Alex Ryan   DOB: 06/02/1970, 41 y.o.   MRN: [id number] Reviewed and agree with this documentation and management.

## 2012-03-14 ENCOUNTER — Encounter: Payer: Self-pay | Admitting: Family Medicine

## 2012-03-14 ENCOUNTER — Ambulatory Visit (INDEPENDENT_AMBULATORY_CARE_PROVIDER_SITE_OTHER): Payer: 59 | Admitting: Family Medicine

## 2012-03-14 VITALS — BP 132/86 | HR 75 | Temp 98.2°F | Wt 266.0 lb

## 2012-03-14 DIAGNOSIS — F411 Generalized anxiety disorder: Secondary | ICD-10-CM

## 2012-03-14 DIAGNOSIS — F419 Anxiety disorder, unspecified: Secondary | ICD-10-CM

## 2012-03-14 MED ORDER — FLUOXETINE HCL 20 MG PO TABS: 20.0000 mg | ORAL_TABLET | Freq: Every day | ORAL | Status: DC

## 2012-03-14 NOTE — Progress Notes (Signed)
  Subjective:    Patient ID: Alex Ryan, male    DOB: Mar 27, 1971, 41 y.o.   MRN: [id number]  HPI Here asking for help with stress. He has been very stressed with family issues and with economic issues. He has been out of work since last winter, and despite all his efforts he still cannot find a job. He is frustrated and angry, and he loses his temper quickly. He cannot relax or sleep well.    Review of Systems  Constitutional: Negative.   Psychiatric/Behavioral: Positive for dysphoric mood and agitation. Negative for hallucinations, confusion and decreased concentration. The patient is nervous/anxious.        Objective:   Physical Exam  Constitutional: He appears well-developed and well-nourished.  Psychiatric: His behavior is normal. Thought content normal.       Anxious           Assessment & Plan:  Try Prozac 20 mg a day. Recheck in 2 weeks.

## 2012-03-31 ENCOUNTER — Encounter: Payer: Self-pay | Admitting: Family Medicine

## 2012-03-31 ENCOUNTER — Ambulatory Visit (INDEPENDENT_AMBULATORY_CARE_PROVIDER_SITE_OTHER): Payer: 59 | Admitting: Family Medicine

## 2012-03-31 VITALS — BP 122/72 | HR 71 | Temp 98.3°F | Wt 261.0 lb

## 2012-03-31 DIAGNOSIS — J40 Bronchitis, not specified as acute or chronic: Secondary | ICD-10-CM

## 2012-03-31 MED ORDER — AMOXICILLIN-POT CLAVULANATE 875-125 MG PO TABS: 1.0000 | ORAL_TABLET | Freq: Two times a day (BID) | ORAL | Status: DC

## 2012-03-31 MED ORDER — ALBUTEROL SULFATE HFA 108 (90 BASE) MCG/ACT IN AERS: 2.0000 | INHALATION_SPRAY | Freq: Four times a day (QID) | RESPIRATORY_TRACT | Status: DC | PRN

## 2012-03-31 NOTE — Progress Notes (Signed)
  Subjective:    Patient ID: Alex Ryan, male    DOB: [birth year], 41 y.o.   MRN: [id number]  HPI Here for 4 days of chest tightness and coughing up yellow sputum. No fever.    Review of Systems  Constitutional: Negative.   HENT: Positive for congestion, postnasal drip and sinus pressure.   Eyes: Negative.   Respiratory: Positive for cough, chest tightness, shortness of breath and wheezing.   Cardiovascular: Negative.        Objective:   Physical Exam  Constitutional: He appears well-developed and well-nourished.  HENT:  Right Ear: External ear normal.  Left Ear: External ear normal.  Nose: Nose normal.  Mouth/Throat: Oropharynx is clear and moist.  Eyes: Conjunctivae normal are normal.  Neck: Neck supple. No thyromegaly present.  Pulmonary/Chest: Effort normal. No respiratory distress. He has no rales.       Scattered wheezes and rhonchi  Lymphadenopathy:    He has no cervical adenopathy.          Assessment & Plan:  Add Delsym and Mucinex

## 2012-04-22 ENCOUNTER — Other Ambulatory Visit: Payer: Self-pay | Admitting: Family Medicine

## 2012-06-20 ENCOUNTER — Encounter: Payer: Self-pay | Admitting: Family Medicine

## 2012-06-20 ENCOUNTER — Ambulatory Visit (INDEPENDENT_AMBULATORY_CARE_PROVIDER_SITE_OTHER): Payer: 59 | Admitting: Family Medicine

## 2012-06-20 VITALS — BP 134/80 | HR 72 | Temp 98.1°F | Wt 250.0 lb

## 2012-06-20 DIAGNOSIS — J40 Bronchitis, not specified as acute or chronic: Secondary | ICD-10-CM

## 2012-06-20 MED ORDER — ALBUTEROL SULFATE HFA 108 (90 BASE) MCG/ACT IN AERS: 2.0000 | INHALATION_SPRAY | RESPIRATORY_TRACT | Status: DC | PRN

## 2012-06-20 MED ORDER — AMOXICILLIN-POT CLAVULANATE 875-125 MG PO TABS: 1.0000 | ORAL_TABLET | Freq: Two times a day (BID) | ORAL | Status: DC

## 2012-06-20 NOTE — Progress Notes (Signed)
  Subjective:    Patient ID: Alex Ryan, male    DOB: [birth year], 42 y.o.   MRN: [id number]  HPI Here for one week of chest tightness and a dry cough. No fever. Using his inhaler.    Review of Systems  Constitutional: Negative.   HENT: Negative.   Eyes: Negative.   Respiratory: Positive for cough and chest tightness.        Objective:   Physical Exam  Constitutional: He appears well-developed and well-nourished.  HENT:  Right Ear: External ear normal.  Left Ear: External ear normal.  Nose: Nose normal.  Mouth/Throat: Oropharynx is clear and moist.  Eyes: Conjunctivae normal are normal.  Pulmonary/Chest: Effort normal. No respiratory distress. He has no rales.       Scattered wheezes and rhonchi   Lymphadenopathy:    He has no cervical adenopathy.          Assessment & Plan:  Recheck prn

## 2012-06-23 ENCOUNTER — Telehealth: Payer: Self-pay | Admitting: Family Medicine

## 2012-06-23 NOTE — Telephone Encounter (Signed)
Pt needs refill on cough syrup, he didn't get the script during his visit this week.

## 2012-06-23 NOTE — Telephone Encounter (Signed)
Call in Hydromet 5 ml q 4 hours prn cough, 240 ml with no rf

## 2012-06-24 MED ORDER — HYDROCODONE-HOMATROPINE 5-1.5 MG/5ML PO SYRP: 5.0000 mL | ORAL_SOLUTION | ORAL | Status: DC | PRN

## 2012-06-24 NOTE — Telephone Encounter (Signed)
I called in script 

## 2012-06-27 ENCOUNTER — Other Ambulatory Visit: Payer: Self-pay | Admitting: Family Medicine

## 2012-07-26 ENCOUNTER — Encounter: Payer: Self-pay | Admitting: Family Medicine

## 2012-07-26 ENCOUNTER — Ambulatory Visit (INDEPENDENT_AMBULATORY_CARE_PROVIDER_SITE_OTHER): Payer: 59 | Admitting: Family Medicine

## 2012-07-26 VITALS — BP 130/78 | HR 71 | Temp 98.1°F | Wt 250.0 lb

## 2012-07-26 DIAGNOSIS — L02419 Cutaneous abscess of limb, unspecified: Secondary | ICD-10-CM

## 2012-07-26 DIAGNOSIS — L03119 Cellulitis of unspecified part of limb: Secondary | ICD-10-CM

## 2012-07-26 MED ORDER — CEPHALEXIN 500 MG PO CAPS: 500.0000 mg | ORAL_CAPSULE | Freq: Three times a day (TID) | ORAL | Status: AC

## 2012-07-26 MED ORDER — FLUOXETINE HCL 20 MG PO TABS: 20.0000 mg | ORAL_TABLET | Freq: Every day | ORAL | Status: DC

## 2012-07-26 NOTE — Progress Notes (Signed)
  Subjective:    Patient ID: Alex Ryan, male    DOB: May 09, 1971, 42 y.o.   MRN: [id number]  HPI Here for 3 days of redness and tenderness in the right lower leg which he recognizes as cellulitis. No fever.    Review of Systems  Constitutional: Negative.   Skin: Positive for color change.       Objective:   Physical Exam  Constitutional: He appears well-developed and well-nourished. No distress.  Skin:  Right lower leg is mildly swollen, red, warm, and tender           Assessment & Plan:  Start on Keflex. Recheck one week

## 2012-08-17 ENCOUNTER — Ambulatory Visit (INDEPENDENT_AMBULATORY_CARE_PROVIDER_SITE_OTHER): Payer: Self-pay | Admitting: Family Medicine

## 2012-08-17 VITALS — BP 112/66 | Wt 254.0 lb

## 2012-08-17 DIAGNOSIS — E119 Type 2 diabetes mellitus without complications: Secondary | ICD-10-CM

## 2012-08-17 NOTE — Progress Notes (Signed)
Patient presents today for 3 month DM follow-up as part of the employer-sponsored Link to Wellness program. Current regimen includes Metformin, Lantus, and Humalog. Patient also continues on daily ARB, and ASA. Last endocrinology follow-up was December 2013, patient will return later this month (April 2014) for 4 month follow-up. Patient is also being followed closely by Dr. Clent Ridges, PCP, and most recently saw him about 2 weeks ago. No new oral medications at this time. Patient continues struggling with diabetic retinopathy and is followed closely by Dr. Luciana Axe. He also has struggled over the past month with bilateral diabetic foot infection and cellulitis.   Diabetes Assessment: MD managing Diabetes Dr. Dorisann Frames; checks feet daily; 14 day CBG average 322; 30 day CBG average 322; 7 day CBG average 322; Highest CBG 402; checks blood glucose once daily; A1c 10.2 (08/17/12)  Other Diabetes History: Patient is currently under the care of Dr. Talmage Nap, endocrinologist. In addition to quarterly office visits, patient is also emailing or calling MD weekly to report glucose readings and for adjustment to insulin dose. He will follow-up again in late April. Patient continues on Metformin 1000 mg twice daily. Insulin regimen has recently been increased to account for hyperglycemia due to diabetic foot infection and subsequent cellulitis.  Current Insulin regimen includes: Lantus 120 units am, 75 units pm Humalog - 35 units per meal, recently increased to 60 units with lunch  Patient seems to be compliant with insulin; however, he does admit that he sometimes neglects to take his lantus, so I anticipate this may happen on occassion. I will watch refills closely and revisit this if it seems to be recurrent.   Patient is currently testing once daily, but reports that Dr. Talmage Nap has asked him to increase testing to pre-meal and at bedtime, with alternating post-prandial readings. I have encouraged him to do so, and have  asked him to begin to log readings in a glucose log book in order to better visualize trends in glucose. Also, patients glucometer was replaced due to malfunction and he began using a new meter about 1 week ago. There were only a few readings over the course of the past week, all readings 200-300s. Patient reports glucose has been in the 300s for approximately 2 weeks as a result of diabetic foot infections and cellulitis. No hypoglycemia at this time. A1c today is 10.2 via [facility] vantage POC testing.   Diabetic Foot Care: Patient has been fighting significant infection over the previous month. Beginning in early March, patient developed a left toe ulcer, which was followed by a right toe ulcer. He then developed cellulitis in both lower legs and is being treated with a 14 day course of cephalexin. Dr. Clent Ridges is following patient's cellulitis. Patient has significant neuropathy and numbness in feet and toes. Generally he does a great job of caring for feet and completing daily inspections. I have further emphasized the importance of visually inspecting feet daily. I have recommended he use a mirror if he cannot view the soles of his feet. Patient seems to understand the importance of foot care and proper completion of antibiotic regimen.   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 He continues being followed by Dr. Luciana Axe who he will see again on April 17th. At this time, pt will undergo laser surgery on both eyes in order to further repair retina. He also has additional surgeries pending for the future. Per MD, treatment to correct his retinopathy could  take as long as 10 years. Patient can see peripherally but central vision continues to be blurred. Patient does not drive before 9 am or after 5 pm for safety reasons. Patient also recieves therapy for macular degeneration, but cannot recall the name.   Proteinuria: Patient reports  bubbly or foamy urine on a regular basis. Patient has reported this to his PCP. I have asked him to continue to monitor this and to report this again to PCP and endocrinologist for further assessment of kidney function.   Lifestyle Factors:  Exercise - Patient and his wife walk 2 miles daily (takes approx 1 hour). They have been on this routine since February and plan to continue. Patient does struggle with seasonal allergies and is concerned that this will inhibit his ability to exercise outdoors. I have recommended an antihistamine and nasal steroid if needed. Patient is unable to use daily pseudoephedrine due to HTN.   Diet - Patient continues to maintain a healthy diet. He has made multiple changes over the past year. He is now eating more vegetables and salads. Salads consist of romaine lettuce, cucumber, tomato, carrot, croutons, small amount of cheese and bacon, and vinegrette dressing. He is also consuming more water and unsweet tea vs soda. He continues limiting dessert and cannot recall the last time he ate sweets. He continues limiting starches including corn and potato, and is aware that pizza and pasta increase glucose considerably. Patient attempts to limit carbs to 30-35 g per meal, and 15 g per snack. Patient eats a lot of grilled chicken as a source of protein. He also enjoys granola bars for breakfast, these have 35 g carb per bar, so patient is aware that this is too much for a snack. Patient was informed by MD that ice water increases metabolism, so patient attempts to drink plenty of this.   Assessment: Patient presents today with uncontrolled diabetes, elevated A1c of 10.2, retinopathy, neuropathy, proteinuria, and active cellulitis currently being treated with cephalexin. Despite these things, patient continues to be active, walking daily with his wife, and attempting to maintain a healthy diet. As a result he has lost 10 lbs over the previous 3 months. Patient is motivated to improve  health and does have room for improvement in compliance and glucose testing. Patient will follow-up with endocrinologist later this month and will return for Link to Wellness follow-up in 8 weeks.   Plan: 1) Weight loss goal of 10 lbs over the next 8 weeks 2) Maintain exercise - at least 2 miles per day 3) Continue to maintain healthy dietary choices 4) Increase testing to once before each meal and alternate post-meal testing 5) Return for follow-up in 8 weeks on Wednesday, June 4th @ 9:00 am

## 2012-08-18 ENCOUNTER — Telehealth: Payer: Self-pay | Admitting: Family Medicine

## 2012-08-18 MED ORDER — LOSARTAN POTASSIUM 50 MG PO TABS: 50.0000 mg | ORAL_TABLET | Freq: Two times a day (BID) | ORAL | Status: DC

## 2012-08-18 NOTE — Telephone Encounter (Signed)
Refill request for Cozaar ( 90 day supply ) and I did send e-scribe

## 2012-08-22 ENCOUNTER — Telehealth: Payer: Self-pay | Admitting: Family Medicine

## 2012-08-22 NOTE — Telephone Encounter (Signed)
Pt would like to begin taking Zyrtec for seasonal allergies and would like a Rx so he can use his FSA card. [facility] ) Also pt needs Gabapentin 300 mg & Losartan 50 mg. ( 90 day supply )

## 2012-08-22 NOTE — Telephone Encounter (Signed)
Call in refills for all three for one year

## 2012-08-23 MED ORDER — GABAPENTIN 300 MG PO CAPS: 300.0000 mg | ORAL_CAPSULE | Freq: Two times a day (BID) | ORAL | Status: DC

## 2012-08-23 MED ORDER — CETIRIZINE HCL 10 MG PO TABS: 10.0000 mg | ORAL_TABLET | Freq: Every day | ORAL | Status: DC

## 2012-08-23 NOTE — Telephone Encounter (Signed)
I sent scripts e-scribe. 

## 2012-08-31 NOTE — Progress Notes (Signed)
Patient ID: Alex Ryan, male   DOB: [birth year], 42 y.o.   MRN: [id number] ATTENDING PHYSICIAN NOTE: I have reviewed the chart and agree with the plan as detailed above. Denny Levy MD Pager [phone]

## 2012-09-15 ENCOUNTER — Telehealth: Payer: Self-pay | Admitting: Family Medicine

## 2012-09-15 ENCOUNTER — Ambulatory Visit: Payer: 59 | Admitting: Family Medicine

## 2012-09-15 DIAGNOSIS — Z0289 Encounter for other administrative examinations: Secondary | ICD-10-CM

## 2012-09-15 MED ORDER — ALCOHOL SWABS PADS: MEDICATED_PAD | Status: DC

## 2012-09-15 NOTE — Telephone Encounter (Signed)
Refill request for Alcohol swab pads and I did script e-scribe.

## 2012-10-05 ENCOUNTER — Encounter: Payer: Self-pay | Admitting: Family Medicine

## 2012-10-05 ENCOUNTER — Ambulatory Visit (INDEPENDENT_AMBULATORY_CARE_PROVIDER_SITE_OTHER): Payer: 59 | Admitting: Family Medicine

## 2012-10-05 VITALS — BP 140/72 | HR 86 | Temp 98.9°F | Wt 250.0 lb

## 2012-10-05 DIAGNOSIS — J209 Acute bronchitis, unspecified: Secondary | ICD-10-CM

## 2012-10-05 DIAGNOSIS — L03119 Cellulitis of unspecified part of limb: Secondary | ICD-10-CM

## 2012-10-05 DIAGNOSIS — L03116 Cellulitis of left lower limb: Secondary | ICD-10-CM

## 2012-10-05 DIAGNOSIS — L02419 Cutaneous abscess of limb, unspecified: Secondary | ICD-10-CM

## 2012-10-05 MED ORDER — AMOXICILLIN-POT CLAVULANATE 875-125 MG PO TABS: 1.0000 | ORAL_TABLET | Freq: Two times a day (BID) | ORAL | Status: DC

## 2012-10-05 MED ORDER — HYDROCODONE-HOMATROPINE 5-1.5 MG/5ML PO SYRP: 5.0000 mL | ORAL_SOLUTION | ORAL | Status: DC | PRN

## 2012-10-05 NOTE — Progress Notes (Signed)
  Subjective:    Patient ID: Alex Ryan, male    DOB: [birth year], 42 y.o.   MRN: [id number]  HPI Here for 2 reasons. First on 09-01-12 he was carrying some furniture and his foot slipped off a step, causing him to scrape the left lower leg on the edge of a brick step. He has been applying Neosporin daily but it looks a little worse. Also 2 days ago he developed some sinus pressure, PND, ST, and a dry cough. No fever.    Review of Systems  Constitutional: Negative.   HENT: Positive for congestion, postnasal drip and sinus pressure.   Eyes: Negative.   Respiratory: Positive for cough.   Skin: Positive for wound.       Objective:   Physical Exam  Constitutional: No distress.  HENT:  Right Ear: External ear normal.  Left Ear: External ear normal.  Nose: Nose normal.  Mouth/Throat: Oropharynx is clear and moist.  Eyes: Conjunctivae are normal.  Pulmonary/Chest: Effort normal and breath sounds normal. No respiratory distress. He has no wheezes. He has no rales.  Lymphadenopathy:    He has no cervical adenopathy.  Skin:  Left lower leg has a large abrasion with clear drainage, surrounded by a zone of erythema and warmth          Assessment & Plan:  Treat both problems with 14 days of Augmentin. Recheck prn

## 2012-10-06 ENCOUNTER — Encounter: Payer: Self-pay | Admitting: Family Medicine

## 2012-12-06 ENCOUNTER — Ambulatory Visit (INDEPENDENT_AMBULATORY_CARE_PROVIDER_SITE_OTHER): Payer: Self-pay | Admitting: Family Medicine

## 2012-12-06 VITALS — BP 124/78 | Wt 238.0 lb

## 2012-12-06 DIAGNOSIS — E119 Type 2 diabetes mellitus without complications: Secondary | ICD-10-CM

## 2012-12-06 NOTE — Progress Notes (Signed)
Patient presents today for 3 month diabetes follow-up as part of the employer-sponsored Link to Wellness program. Current diabetes regimen includes Metformin, Lantus, and Humalog. Patient also continues on daily ASA and ARB. Most recent MD follow-up was this past April and patient is due for a follow-up at this time. He will make an appt soon. No med changes at this time other than a recent course of prednisone due to respiratory flare.   Diabetes Assessment: Type of Diabetes: Type 2; Sees Diabetes provider 4 or more times per year; MD managing Diabetes Dr. Dorisann Frames; checks feet daily Pt does a great job with checking feet daily; uses glucometer; takes medications as prescribed; checks blood glucose 3-4 times a day; A1c 11.0 via POC testing 12/06/12 Other Diabetes History: Patient is currently under the care of Dr. Talmage Nap, endocrinologist. He is overdue for an appt and will schedule one soon. Patient continues on Metformin 1000 mg twice daily. Insulin regimen is adjusted on as needed and per sliding scale. Current Insulin regimen includes: Lantus 75 units am, 75 units pm Humalog - Per sliding scale, patient reports decreasing this dose due to hypoglycemia and is now using no more than 25 unit s per meal.  Patient seems to be compliant with insulin; however, he does admit that he sometimes forgets a dose. so I anticipate this may happen on occassion.   Patient has increased testing and is now testing 3-4 times daily, fasting and before meals, and as needed. He did not bring meter today but reports that glucose has been averaging 120-200. He has recently started a course of prednisone and this has caused glucose to increase significantly, even as high as >500. Patient is attempting to adjust for these highs by increaseing Humalog, but with little success. Patient also reports hypoglycemic episodes occuring approximately once per week, as low as the 50-60s. Patient is able to correct these and usually does  so with orange juice. A1c today has increased to 11 via [facility] vantage POC testing.   Diabetic Foot Care: Fortunately, patient reports no change in neuropathy and no worsened symptoms. He also reports that his cellulitis has now cleared and there are no active infections or signs of infection.   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 He continues being followed by Dr. Luciana Axe who he will see again this Thursday. At this time, they will discuss future and hopefully, final, surgeries. Correction of this retinopathy is a work in progress but patient is doing well with adhering to appt, etc. Patient continues limiting driving 9 am or after 5 pm for safety reasons.   Proteinuria: Patient continues to report bubbly or foamy urine on a regular basis. PCP and endo are aware.   Lifestyle Factors: Exercise - Still walking but now only twice per week, usually on saturday and wednesdays when he is off work. He is walking about 1 hour on these days. He and his wife also soemtimes walk during their lunch break. Time is a limiting factor for this patient as he is now working two shifts, 10-4 M-F, and 5:30-9 pm on M, T, Th, F, Sa.  Diet - Now eating 6 small meals per day and attempting to avoid large meals. Limiting fries and now opting for side salads when eating fast food. Choosing grilled chicken sandwiches over burgers. No soda at this time, drinking tea (sweetened with splenda) and water.  Assessment: Patient presents today with uncontrolled diabetes, furthered elevated  A1c of 11, retinopathy, neuropathy, and proteinuria. Despite these things, patient continues to be active, walking regularly with his wife, and attempting to maintain a healthy diet although these things have been challenged by changes in work schedule. On a positive note he has lost ~14 lbs over the previous 3 months. Patient is motivated to improve health but  continues to have room for improvement in compliance and exercise. Patient will follow-up with endocrinologist soon and will return for Link to Wellness follow-up in 3 months.  Plan: 1) Continue making dietary changes and attempt to maintain current changes 2) Continue walking at least twice per week and attempt to walk on lunch breaks 3) Continue testing at least 3-4 times per day 4) Congratulations on weight loss!!  5) Schedule follow-up appt with Dr. Talmage Nap 6) Follow-up in 3 months on Tuesday October 21st @ 9:00 am

## 2012-12-19 ENCOUNTER — Ambulatory Visit (INDEPENDENT_AMBULATORY_CARE_PROVIDER_SITE_OTHER): Payer: 59 | Admitting: Family Medicine

## 2012-12-19 ENCOUNTER — Encounter: Payer: Self-pay | Admitting: Family Medicine

## 2012-12-19 VITALS — BP 160/90 | HR 81 | Temp 98.3°F | Wt 250.0 lb

## 2012-12-19 DIAGNOSIS — F411 Generalized anxiety disorder: Secondary | ICD-10-CM

## 2012-12-19 DIAGNOSIS — L02419 Cutaneous abscess of limb, unspecified: Secondary | ICD-10-CM

## 2012-12-19 DIAGNOSIS — I10 Essential (primary) hypertension: Secondary | ICD-10-CM

## 2012-12-19 DIAGNOSIS — R609 Edema, unspecified: Secondary | ICD-10-CM

## 2012-12-19 MED ORDER — DOXYCYCLINE HYCLATE 100 MG PO CAPS: 100.0000 mg | ORAL_CAPSULE | Freq: Two times a day (BID) | ORAL | Status: AC

## 2012-12-19 MED ORDER — FLUOXETINE HCL 40 MG PO CAPS: 40.0000 mg | ORAL_CAPSULE | Freq: Every day | ORAL | Status: DC

## 2012-12-19 MED ORDER — FUROSEMIDE 40 MG PO TABS: 40.0000 mg | ORAL_TABLET | Freq: Two times a day (BID) | ORAL | Status: DC

## 2012-12-19 NOTE — Progress Notes (Signed)
  Subjective:    Patient ID: Alex Ryan, male    DOB: January 24, 1971, 42 y.o.   MRN: [id number]  HPI Here to discuss his anxiety levels and for cellulitis in th left leg. This started 4 days ago with redness and pain in the leg. Using Neosporin. No fever. Also he wants to increase the prozac dose. He has been irritable and anxious.    Review of Systems  Constitutional: Negative.   Cardiovascular: Positive for leg swelling.  Psychiatric/Behavioral: Positive for agitation. Negative for behavioral problems, confusion, dysphoric mood and decreased concentration. The patient is nervous/anxious.        Objective:   Physical Exam  Constitutional: He appears well-developed and well-nourished.  Cardiovascular: Normal rate, regular rhythm, normal heart sounds and intact distal pulses.   Pulmonary/Chest: Effort normal and breath sounds normal.  Skin:  The left lower leg is red, warm, swollen, and tender   Psychiatric: He has a normal mood and affect. His behavior is normal. Thought content normal.          Assessment & Plan:  Treat the cellulitis with Doxycycline. Increase Prozac to 40 mg daily.

## 2012-12-20 NOTE — Progress Notes (Signed)
Patient ID: Alex Ryan, male   DOB: 08-31-1970, 42 y.o.   MRN: [id number] ATTENDING PHYSICIAN NOTE: I have reviewed the chart and agree with the plan as detailed above. Denny Levy MD Pager [phone]

## 2013-01-09 ENCOUNTER — Other Ambulatory Visit: Payer: Self-pay | Admitting: Family Medicine

## 2013-01-31 ENCOUNTER — Telehealth: Payer: Self-pay | Admitting: Family Medicine

## 2013-01-31 NOTE — Telephone Encounter (Signed)
I have scheduled this patient for an appointment tomorrow morning, for his poison oak or ivey. This was the last available appointment that you had, a SDA. Would you be willing to call him medication in without seeing him, or would you like him to keep this appointment? Thank you!

## 2013-02-01 ENCOUNTER — Ambulatory Visit (INDEPENDENT_AMBULATORY_CARE_PROVIDER_SITE_OTHER): Payer: 59 | Admitting: Family Medicine

## 2013-02-01 ENCOUNTER — Encounter: Payer: Self-pay | Admitting: Family Medicine

## 2013-02-01 VITALS — BP 140/80 | HR 60 | Temp 98.1°F | Wt 257.0 lb

## 2013-02-01 DIAGNOSIS — L259 Unspecified contact dermatitis, unspecified cause: Secondary | ICD-10-CM

## 2013-02-01 MED ORDER — BETAMETHASONE DIPROPIONATE AUG 0.05 % EX CREA: TOPICAL_CREAM | Freq: Two times a day (BID) | CUTANEOUS | Status: DC

## 2013-02-01 MED ORDER — ALCOHOL SWABS PADS: MEDICATED_PAD | Status: DC

## 2013-02-01 NOTE — Progress Notes (Signed)
  Subjective:    Patient ID: Alex Ryan, male    DOB: [birth year], 42 y.o.   MRN: [id number]  HPI Here for an itchy rash on both arms. He was clearing some weeds in his yard last weekend.    Review of Systems  Constitutional: Negative.   Skin: Positive for rash.       Objective:   Physical Exam  Constitutional: He appears well-developed and well-nourished.  Skin:  Maculopapular red rash on both forearms           Assessment & Plan:  Recheck prn

## 2013-02-01 NOTE — Telephone Encounter (Signed)
He is scheduled to be be seen today

## 2013-02-20 ENCOUNTER — Ambulatory Visit: Payer: 59 | Admitting: Family Medicine

## 2013-02-21 ENCOUNTER — Ambulatory Visit (INDEPENDENT_AMBULATORY_CARE_PROVIDER_SITE_OTHER): Payer: 59 | Admitting: Family Medicine

## 2013-02-21 ENCOUNTER — Encounter: Payer: Self-pay | Admitting: Family Medicine

## 2013-02-21 VITALS — BP 130/70 | HR 67 | Temp 97.6°F | Wt 261.0 lb

## 2013-02-21 DIAGNOSIS — E119 Type 2 diabetes mellitus without complications: Secondary | ICD-10-CM

## 2013-02-21 NOTE — Progress Notes (Signed)
  Subjective:    Patient ID: Alex Ryan, male    DOB: 01-09-1971, 42 y.o.   MRN: [id number]  HPI    Review of Systems     Objective:   Physical Exam        Assessment & Plan:  He had to leave without being seen due to another appointment

## 2013-02-27 ENCOUNTER — Ambulatory Visit: Payer: 59 | Admitting: Family Medicine

## 2013-03-06 ENCOUNTER — Encounter: Payer: Self-pay | Admitting: Family Medicine

## 2013-03-06 ENCOUNTER — Ambulatory Visit (INDEPENDENT_AMBULATORY_CARE_PROVIDER_SITE_OTHER): Payer: 59 | Admitting: Family Medicine

## 2013-03-06 VITALS — BP 128/80 | HR 67 | Temp 97.7°F | Wt 254.0 lb

## 2013-03-06 DIAGNOSIS — E1149 Type 2 diabetes mellitus with other diabetic neurological complication: Secondary | ICD-10-CM

## 2013-03-06 DIAGNOSIS — I10 Essential (primary) hypertension: Secondary | ICD-10-CM

## 2013-03-06 DIAGNOSIS — E1140 Type 2 diabetes mellitus with diabetic neuropathy, unspecified: Secondary | ICD-10-CM

## 2013-03-06 DIAGNOSIS — E1142 Type 2 diabetes mellitus with diabetic polyneuropathy: Secondary | ICD-10-CM

## 2013-03-06 DIAGNOSIS — E11319 Type 2 diabetes mellitus with unspecified diabetic retinopathy without macular edema: Secondary | ICD-10-CM | POA: Insufficient documentation

## 2013-03-06 DIAGNOSIS — E119 Type 2 diabetes mellitus without complications: Secondary | ICD-10-CM

## 2013-03-06 DIAGNOSIS — E1139 Type 2 diabetes mellitus with other diabetic ophthalmic complication: Secondary | ICD-10-CM

## 2013-03-06 NOTE — Progress Notes (Signed)
  Subjective:    Patient ID: Alex Ryan, male    DOB: Dec 15, 1970, 42 y.o.   MRN: [id number]  HPI Here to discuss his efforts to obtain disability. He has severe diabetes and he sees Dr. Talmage Nap for this. He has severe retinopathy, and he sees Dr. Dione Booze for general eye care and Dr. Luciana Axe for retina care. He is legally blind now ,even thouh he has tried to work. He lost his driver's license due to his rapidly failing vision. He also has debilitating neuropathy in the feet causing severe burning pains and numbness. He asks for a referral to Neurology to help with this.    Review of Systems  Constitutional: Negative.   Eyes: Positive for visual disturbance.  Respiratory: Negative.   Cardiovascular: Negative.   Neurological: Positive for numbness.       Objective:   Physical Exam  Constitutional: He appears well-developed and well-nourished.  Cardiovascular: Normal rate, regular rhythm, normal heart sounds and intact distal pulses.   Pulmonary/Chest: Effort normal and breath sounds normal.          Assessment & Plan:  We will refer to Neurology. I do feel he is totally disabled and unable to work, and we will generate a letter to this effect.

## 2013-03-07 ENCOUNTER — Ambulatory Visit (INDEPENDENT_AMBULATORY_CARE_PROVIDER_SITE_OTHER): Payer: Self-pay | Admitting: Family Medicine

## 2013-03-07 VITALS — BP 128/68 | Wt 254.0 lb

## 2013-03-07 DIAGNOSIS — E119 Type 2 diabetes mellitus without complications: Secondary | ICD-10-CM

## 2013-03-07 NOTE — Progress Notes (Signed)
Patient presents today for 3 month diabetes follow-up as part of the employer-sponsored Link to Wellness program. Current diabetes regimen includes Lantus, Humalog, and Invokamet. Patient also continues on daily ASA and ARB. Most recent MD follow-up with Dr. Talmage Nap was September 2014 and last follow-up with Dr. Clent Ridges was October 2014. Patient has a pending appt for January with Talmage Nap and November with Clent Ridges. Patient sees Dr. Clent Ridges every month for follow-up. At September appt, patient was started on Invokamet (Invokana + Metformin). No other med changes at this time.   Patient continues being followed by Dr. Dione Booze, opthalmologist. 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 His vision is 2400 in once eye and 2300 in other eye, he has been diagnosed as legally blind and has had his license revoked as of last month due to inability to see oncoming traffic, lights, signs, pedestrians, etc. This was a choice of both physician and patient.   Diabetes Assessment: Type of Diabetes: Type 2; Sees Diabetes provider 4 or more times per year; MD managing Diabetes Dr. Dorisann Frames; checks feet daily Pt does a great job with checking feet daily. He dries feet and toes by hand after each shower, inspects feet daily, and moisturizes and ensures feet are dry before placing socks and shoes on.; uses glucometer; takes medications as prescribed; Current Diabetes related medical conditions are Eye problems, High blood pressure, Neuropathy; 30 day CBG average 170; 14 day CBG average 179; 7 day CBG average 194; Lowest CBG 58; Highest CBG 360; A1c 7.7 (prev 11)  Other Diabetes History: Patient is currently under the care of Dr. Talmage Nap, endocrinologist. He was seen last in September and was placed on Invokamet at this time. He is responding well with no adverse events. Insulin regimen is adjusted as needed and per sliding scale. Current Insulin regimen includes: Lantus  75 units am, 75 units pm Humalog - Per sliding scale, 45 with breakfast, 30-35 with dinner, mid-day dosing is optional and only depends on glucose. Pt does have a sliding scale that I have encouraged him to use throughout the day as needed.  Patient seems to be compliant with insulin.  Patient has increased testing and is now testing 2-5 times daily, fasting and before meals, and as needed. He did bring meter today. Glucose averages 100-300s depending on time of day and insulin taken. Of concern, after lunch glucose and evening glucose readings are most uncontrolled, often 200-300. I have encouraged patient to always dose for lunch using sliding scale and to use this scale as a correction dose during other parts of the day. Patient reports only a few hypoglycemic episodes as low as the 50s. These are easy to recognize and treat. A1c today has decreased to 7.7 (from 11) via [facility]antage POC testing.   Diabetic Foot Care: Unfortunately, patient reports worsened neuropathy with increased pain, characterized by aches and stabbing needle-like pain, especially in lower extremeties, but radiating up to hips. Left leg is worse. Patient has one active sore on outer aspect of left thigh, but Dr. Clent Ridges is monitoring this closely and sore has decreased in size significantly over previous 3 months.   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 He continues being followed by Dr. Luciana Axe and Dr. Dione Booze. Patient will likely have no further surgeries. Vision is 2400 in one eye and 2300 in other eye. As of last month Dr. Dione Booze diagnosed  pt as legally blind and a decision was made to revoke license due to inability to see oncoming traffic, signal lights, dashboard gauges, and pedestrians. Patient is adjusting well and was on board with this decision.   Lifestyle Factors: Exercise - Patient is not walking as regularly as he was at one time. He no  walks parking lot a few times per week with this wife. Neuropathy pain is the limiting factor at this time. Patient does report that exercise improves the pain but it is always present. He is motivated to continue moving in order to stay mobile. Left side neuropathy is worse and is primarily in lower extremeties but radiates up to hips.  Diet - Cntinues avoiding white foods, limiting red meats, etc. Loves fish and chicken and seasons with Lemon juice, pepper, sea salt, and parsley flakes. Also eats salads nearly every night. Limiting fried foods. But does admit that it is difficult to limit fries, eating 2-3 times per week.   Assessment: Patient presents today with uncontrolled diabetes, but improved A1c of 7.7 (prev 11) after start of Invokana. Retinopathy, neuropathy, and proteinuria remain. Despite these things, patient continues to be as active as possible, walking regularly with his wife, and attempting to maintain a healthy diet although these things have been challenged by changes in work schedule. Patient is motivated to improve health but continues to have room for improvement in managing humalog sliding scale. Patient will follow-up with endocrinologist regularly and will return for Link to Wellness follow-up in 3 months.  Plan: 1) Continue making healthy dietary choices 2) Continue exercise as much as tolerated 3) Continue testing regularly 4) Focus on using humalog sliding scale to regulate afternoon and evening glucose 5) Return for follow-up in 3 months on Tuesday January 20th @ 9:00 am

## 2013-04-07 ENCOUNTER — Telehealth: Payer: Self-pay | Admitting: Family Medicine

## 2013-04-07 NOTE — Telephone Encounter (Signed)
Rec'd from [facility] forward to 4 pages to Dr. Clent Ridges

## 2013-04-11 ENCOUNTER — Telehealth: Payer: Self-pay | Admitting: Family Medicine

## 2013-04-11 NOTE — Telephone Encounter (Signed)
Pt is requesting refills on all medications that Dr. Clent Ridges prescribes. He would like at least a 90 day supply sent to [facility] outpatient pharmacy.

## 2013-04-11 NOTE — Telephone Encounter (Signed)
He has refills on everything already except Metoprolol. Refill this for one year

## 2013-04-12 MED ORDER — METOPROLOL SUCCINATE ER 100 MG PO TB24: ORAL_TABLET | ORAL | Status: DC

## 2013-04-12 NOTE — Telephone Encounter (Signed)
I sent script e-scribe. 

## 2013-04-27 ENCOUNTER — Ambulatory Visit (INDEPENDENT_AMBULATORY_CARE_PROVIDER_SITE_OTHER): Payer: 59 | Admitting: Family Medicine

## 2013-04-27 ENCOUNTER — Encounter: Payer: Self-pay | Admitting: Family Medicine

## 2013-04-27 VITALS — BP 140/80 | HR 72 | Temp 98.0°F | Wt 260.0 lb

## 2013-04-27 DIAGNOSIS — R42 Dizziness and giddiness: Secondary | ICD-10-CM

## 2013-04-27 DIAGNOSIS — E1142 Type 2 diabetes mellitus with diabetic polyneuropathy: Secondary | ICD-10-CM

## 2013-04-27 DIAGNOSIS — E1140 Type 2 diabetes mellitus with diabetic neuropathy, unspecified: Secondary | ICD-10-CM

## 2013-04-27 DIAGNOSIS — E1149 Type 2 diabetes mellitus with other diabetic neurological complication: Secondary | ICD-10-CM

## 2013-04-27 NOTE — Progress Notes (Signed)
   Subjective:    Patient ID: Alex Ryan, male    DOB: July 12, 1970, 42 y.o.   MRN: [id number]  HPI Here asking advice about disequilibrium and feeling like he will lose his balance. This has been present for at least 6 months. He does not feel like the room is spinning. He sees Dr. Hyacinth Meeker for neurologic care and he has severe multifactorial neuropathies. He recently saw Dr. Hyacinth Meeker who order lab tests and nerve conduction studies.    Review of Systems  Constitutional: Negative.   Neurological: Positive for dizziness and numbness. Negative for tremors, seizures, syncope, facial asymmetry, speech difficulty, weakness, light-headedness and headaches.       Objective:   Physical Exam  Constitutional: He is oriented to person, place, and time. He appears well-developed and well-nourished.  HENT:  Right Ear: External ear normal.  Left Ear: External ear normal.  Nose: Nose normal.  Mouth/Throat: Oropharynx is clear and moist.  Eyes: Conjunctivae are normal.  Cardiovascular: Normal rate, regular rhythm, normal heart sounds and intact distal pulses.   Pulmonary/Chest: Effort normal and breath sounds normal.  Neurological: He is alert and oriented to person, place, and time. He has normal reflexes. No cranial nerve deficit. He exhibits normal muscle tone. Coordination normal.          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 He will follow up with Dr. Hyacinth Meeker.

## 2013-05-09 ENCOUNTER — Telehealth: Payer: Self-pay | Admitting: Family Medicine

## 2013-05-09 NOTE — Telephone Encounter (Signed)
Recd records from [facility] Forwarding 2pgs to Dr.Fry

## 2013-05-22 ENCOUNTER — Encounter: Payer: Self-pay | Admitting: Family Medicine

## 2013-05-22 ENCOUNTER — Ambulatory Visit (INDEPENDENT_AMBULATORY_CARE_PROVIDER_SITE_OTHER): Payer: 59 | Admitting: Family Medicine

## 2013-05-22 VITALS — BP 150/80 | HR 75 | Temp 98.0°F | Wt 264.0 lb

## 2013-05-22 DIAGNOSIS — R55 Syncope and collapse: Secondary | ICD-10-CM

## 2013-05-22 DIAGNOSIS — I10 Essential (primary) hypertension: Secondary | ICD-10-CM

## 2013-05-22 DIAGNOSIS — E1149 Type 2 diabetes mellitus with other diabetic neurological complication: Secondary | ICD-10-CM

## 2013-05-22 DIAGNOSIS — E1140 Type 2 diabetes mellitus with diabetic neuropathy, unspecified: Secondary | ICD-10-CM

## 2013-05-22 DIAGNOSIS — E1142 Type 2 diabetes mellitus with diabetic polyneuropathy: Secondary | ICD-10-CM

## 2013-05-22 DIAGNOSIS — E119 Type 2 diabetes mellitus without complications: Secondary | ICD-10-CM

## 2013-05-22 MED ORDER — LORAZEPAM 1 MG PO TABS: 1.0000 mg | ORAL_TABLET | Freq: Four times a day (QID) | ORAL | Status: DC | PRN

## 2013-05-22 NOTE — Progress Notes (Signed)
Pre visit review using our clinic review tool, if applicable. No additional management support is needed unless otherwise documented below in the visit note. 

## 2013-05-22 NOTE — Progress Notes (Signed)
   Subjective:    Patient ID: Alex Ryan, male    DOB: 04-12-1971, 43 y.o.   MRN: [id number]  HPI Here for an episode of passing out which occurred at home on 05-13-13. He had been out shopping and when he got home, got out of his car, and walked into the kitchen he lost consciousness and fell forward. No injuries were sustained. His wife witnessed this and helped him. She immediately checked his glucose and it was 180. His BP then was 118/80. He regained consciousness in about 60 seconds. No chest pain or SOB. He has felt fine ever since. He has been seeing Dr. Sabra Heck for neuropathy but he has never had a syncopal spell before.    Review of Systems  Constitutional: Negative.   HENT: Negative.   Eyes: Negative.   Respiratory: Negative.   Cardiovascular: Negative.   Neurological: Positive for syncope. Negative for dizziness, tremors, seizures, facial asymmetry, speech difficulty, weakness, light-headedness, numbness and headaches.       Objective:   Physical Exam  Constitutional: He is oriented to person, place, and time. He appears well-developed and well-nourished. No distress.  HENT:  Head: Normocephalic and atraumatic.  Eyes: Conjunctivae and EOM are normal. Pupils are equal, round, and reactive to light.  Neck: No thyromegaly present.  Cardiovascular: Normal rate, regular rhythm, normal heart sounds and intact distal pulses.   Pulmonary/Chest: Effort normal and breath sounds normal.  Lymphadenopathy:    He has no cervical adenopathy.  Neurological: He is alert and oriented to person, place, and time. He has normal reflexes. No cranial nerve deficit. He exhibits normal muscle tone. Coordination normal.          Assessment & Plan:  He had a brief syncopal spell that was most likely a vasovagal event. He may have been dehydrated. Encouraged him to drink more water daily. We will set up a brain MRI soon.

## 2013-05-26 ENCOUNTER — Other Ambulatory Visit: Payer: Self-pay | Admitting: Family Medicine

## 2013-05-27 ENCOUNTER — Ambulatory Visit (INDEPENDENT_AMBULATORY_CARE_PROVIDER_SITE_OTHER): Payer: 59 | Admitting: Adult Health

## 2013-05-27 ENCOUNTER — Encounter: Payer: Self-pay | Admitting: Adult Health

## 2013-05-27 VITALS — BP 130/70 | HR 83 | Temp 100.6°F | Resp 16 | Ht 70.0 in | Wt 256.2 lb

## 2013-05-27 DIAGNOSIS — R6889 Other general symptoms and signs: Secondary | ICD-10-CM

## 2013-05-27 DIAGNOSIS — R112 Nausea with vomiting, unspecified: Secondary | ICD-10-CM | POA: Insufficient documentation

## 2013-05-27 DIAGNOSIS — J069 Acute upper respiratory infection, unspecified: Secondary | ICD-10-CM

## 2013-05-27 LAB — POCT INFLUENZA A/B
INFLUENZA A, POC: NEGATIVE
INFLUENZA B, POC: NEGATIVE

## 2013-05-27 MED ORDER — AMOXICILLIN-POT CLAVULANATE 875-125 MG PO TABS: 1.0000 | ORAL_TABLET | Freq: Two times a day (BID) | ORAL | Status: DC

## 2013-05-27 MED ORDER — ONDANSETRON HCL 4 MG PO TABS: 4.0000 mg | ORAL_TABLET | Freq: Three times a day (TID) | ORAL | Status: DC | PRN

## 2013-05-27 NOTE — Patient Instructions (Signed)
  Start Augmentin 1 tablet twice a day for 10 days.  Recommend probiotic while taking antibiotics such as Align or Culturelle  Zofran 4 mg every 8 hours as needed for nausea and vomiting.  Drink plenty of fluids to remain hydrated

## 2013-05-27 NOTE — Assessment & Plan Note (Signed)
Augmentin bid x 10 days. RTC if no improvement within 4-5 days.

## 2013-05-27 NOTE — Assessment & Plan Note (Signed)
Clear liquids progress to bland diet as tolerated. zofran 4 mg q 8 hours as needed.

## 2013-05-27 NOTE — Progress Notes (Signed)
Pre-visit discussion using our clinic review tool. No additional management support is needed unless otherwise documented below in the visit note.  

## 2013-05-27 NOTE — Progress Notes (Signed)
Subjective:    Patient ID: Alex Ryan, male    DOB: 06/18/1970, 43 y.o.   MRN: [id number]  HPI  Flu- like symptoms started yesterday. Fever reported. Body aches. Some nausea and vomiting.  Current Outpatient Prescriptions on File Prior to Visit  Medication Sig Dispense Refill  . albuterol (PROVENTIL HFA;VENTOLIN HFA) 108 (90 BASE) MCG/ACT inhaler Inhale 2 puffs into the lungs every 4 (four) hours as needed for wheezing or shortness of breath. PRN:  Allergies/breathing  1 Inhaler  11  . Alcohol Swabs PADS Use up to 10 times per day for testing and insulin injections, diagnosis code is 250.00  300 each  3  . amLODipine (NORVASC) 5 MG tablet TAKE 1 TABLET BY MOUTH DAILY.  90 tablet  2  . aspirin 325 MG EC tablet Take 325 mg by mouth daily.      Marland Kitchen augmented betamethasone dipropionate (DIPROLENE-AF) 0.05 % cream Apply topically 2 (two) times daily.  50 g  2  . Canagliflozin-Metformin HCl (INVOKAMET) [phone] MG TABS Take 1 tablet by mouth 2 (two) times daily.      . cetirizine (ZYRTEC) 10 MG tablet Take 1 tablet (10 mg total) by mouth daily.  90 tablet  3  . diphenhydrAMINE (BENADRYL) 25 mg capsule Take 25 mg by mouth daily. For allergies      . FLUoxetine (PROZAC) 40 MG capsule Take 1 capsule (40 mg total) by mouth daily.  90 capsule  3  . furosemide (LASIX) 40 MG tablet Take 1 tablet (40 mg total) by mouth 2 (two) times daily. As needed for fluid retention  180 tablet  3  . gabapentin (NEURONTIN) 300 MG capsule Take 1 capsule (300 mg total) by mouth 2 (two) times daily.  180 capsule  3  . ibuprofen (ADVIL,MOTRIN) 200 MG tablet Take 600 mg by mouth daily as needed. for pain      . Insulin Glargine (LANTUS [place]) Inject 75 Units into the skin 2 (two) times daily.       . insulin lispro (HUMALOG) 100 UNIT/ML injection Inject into the skin 3 (three) times daily before meals. 45 units before breakfast, 10 units before lunch, 30 units at bedtime with snack      . LORazepam (ATIVAN) 1 MG tablet Take  1 tablet (1 mg total) by mouth every 6 (six) hours as needed for anxiety.  30 tablet  0  . losartan (COZAAR) 50 MG tablet TAKE 1 TABLET BY MOUTH 2 TIMES DAILY.  180 tablet  1  . metoprolol succinate (TOPROL-XL) 100 MG 24 hr tablet TAKE 1 TABLET BY MOUTH DAILY.  90 tablet  3   No current facility-administered medications on file prior to visit.      Review of Systems  Constitutional: Positive for fever, chills and appetite change.  Respiratory: Positive for cough.   Gastrointestinal: Positive for nausea and vomiting.       Objective:   Physical Exam  Constitutional: He is oriented to person, place, and time.  Pleasant 43 y/o male - appears acutely ill  HENT:  Head: Normocephalic and atraumatic.  bilateral ear canals are erythematous. Tympanic membranes are normal.pharyngeal erythema without exudate. Notable drainage posterior pharynx  Cardiovascular: Normal rate, regular rhythm and normal heart sounds.  Exam reveals no gallop.   No murmur heard. Pulmonary/Chest: Effort normal and breath sounds normal. No respiratory distress. He has no wheezes. He has no rales.  Lymphadenopathy:    He has cervical adenopathy.  Neurological: He is alert  and oriented to person, place, and time.  Psychiatric: He has a normal mood and affect. His behavior is normal. Judgment and thought content normal.          Assessment & Plan:

## 2013-05-29 ENCOUNTER — Telehealth: Payer: Self-pay | Admitting: Family Medicine

## 2013-05-29 NOTE — Telephone Encounter (Signed)
Script for Losartan was sent e-scribe.

## 2013-06-01 ENCOUNTER — Telehealth: Payer: Self-pay | Admitting: Family Medicine

## 2013-06-01 MED ORDER — HYDROCODONE-HOMATROPINE 5-1.5 MG/5ML PO SYRP: 5.0000 mL | ORAL_SOLUTION | ORAL | Status: DC | PRN

## 2013-06-01 NOTE — Telephone Encounter (Signed)
done

## 2013-06-01 NOTE — Telephone Encounter (Signed)
Script is ready for pick up and I spoke with pt.  

## 2013-06-01 NOTE — Telephone Encounter (Signed)
Pt was seen at [facility] for upper respiratory problem. He is still coughing and cannot get any rest at night because of this. Can we give a script for cough syrup?

## 2013-06-05 ENCOUNTER — Ambulatory Visit
Admission: RE | Admit: 2013-06-05 | Discharge: 2013-06-05 | Disposition: A | Discharge status: Home / Self Care | Payer: 59 | Source: Ambulatory Visit | Attending: Family Medicine | Admitting: Family Medicine

## 2013-06-05 DIAGNOSIS — R55 Syncope and collapse: Secondary | ICD-10-CM

## 2013-06-05 MED ORDER — GADOBENATE DIMEGLUMINE 529 MG/ML IV SOLN
20.0000 mL | Freq: Once | INTRAVENOUS | Status: AC | PRN
  Administered 2013-06-05: 20 mL via INTRAVENOUS

## 2013-06-08 ENCOUNTER — Telehealth: Payer: Self-pay | Admitting: Family Medicine

## 2013-06-08 NOTE — Telephone Encounter (Signed)
Call in a Zpack  ?

## 2013-06-08 NOTE — Telephone Encounter (Signed)
Pt said that he is still coughing and shortness of breath, wants to know if this could be asthma attacks that he is having? He has to keep a cough drop in mouth, just to try and keep from coughing. He is taking the prescription cough syrup as well.

## 2013-06-09 ENCOUNTER — Telehealth: Payer: Self-pay | Admitting: Family Medicine

## 2013-06-09 MED ORDER — AZITHROMYCIN 250 MG PO TABS
ORAL_TABLET | ORAL | Status: DC
Start: 2013-06-09 — End: 2013-06-26

## 2013-06-09 NOTE — Telephone Encounter (Signed)
I sent script e-scribe to [place] and left a voice message for pt.

## 2013-06-09 NOTE — Telephone Encounter (Signed)
Rec'd from [facility] forward 2 pages to Dr. Sarajane Jews

## 2013-06-26 ENCOUNTER — Encounter: Payer: Self-pay | Admitting: Family Medicine

## 2013-06-26 ENCOUNTER — Ambulatory Visit (INDEPENDENT_AMBULATORY_CARE_PROVIDER_SITE_OTHER): Payer: 59 | Admitting: Family Medicine

## 2013-06-26 VITALS — BP 122/70 | HR 72 | Temp 98.4°F | Ht 70.0 in | Wt 244.0 lb

## 2013-06-26 DIAGNOSIS — B354 Tinea corporis: Secondary | ICD-10-CM

## 2013-06-26 DIAGNOSIS — L0100 Impetigo, unspecified: Secondary | ICD-10-CM

## 2013-06-26 MED ORDER — FLUCONAZOLE 100 MG PO TABS: 100.0000 mg | ORAL_TABLET | Freq: Two times a day (BID) | ORAL | Status: DC

## 2013-06-26 MED ORDER — MUPIROCIN CALCIUM 2 % EX CREA: 1.0000 "application " | TOPICAL_CREAM | Freq: Three times a day (TID) | CUTANEOUS | Status: DC

## 2013-06-26 NOTE — Progress Notes (Signed)
   Subjective:    Patient ID: Alex Ryan, male    DOB: 16-Aug-1970, 43 y.o.   MRN: [id number]  HPI Here for 3 days of a painful rash in both armpits. Using cornstarch at home. He is not using deodorant.    Review of Systems  Constitutional: Negative.   Skin: Positive for rash.       Objective:   Physical Exam  Constitutional: He appears well-developed and well-nourished.  Skin:  Both axillae have patches of macular erythema with purulent drainage, no pustules or vesicles           Assessment & Plan:  This is a tinea with a superimposed bacterial infection. Treat with oral Fluconazole and topical Bactroban

## 2013-06-26 NOTE — Progress Notes (Signed)
Pre visit review using our clinic review tool, if applicable. No additional management support is needed unless otherwise documented below in the visit note. 

## 2013-07-24 ENCOUNTER — Encounter: Payer: Self-pay | Admitting: Family Medicine

## 2013-07-24 ENCOUNTER — Ambulatory Visit (INDEPENDENT_AMBULATORY_CARE_PROVIDER_SITE_OTHER): Payer: 59 | Admitting: Family Medicine

## 2013-07-24 VITALS — BP 148/76 | HR 75 | Temp 98.7°F | Ht 70.0 in | Wt 256.0 lb

## 2013-07-24 DIAGNOSIS — E119 Type 2 diabetes mellitus without complications: Secondary | ICD-10-CM

## 2013-07-24 DIAGNOSIS — L02419 Cutaneous abscess of limb, unspecified: Secondary | ICD-10-CM

## 2013-07-24 DIAGNOSIS — L03116 Cellulitis of left lower limb: Secondary | ICD-10-CM

## 2013-07-24 DIAGNOSIS — L03119 Cellulitis of unspecified part of limb: Secondary | ICD-10-CM

## 2013-07-24 MED ORDER — CEPHALEXIN 500 MG PO CAPS: 500.0000 mg | ORAL_CAPSULE | Freq: Four times a day (QID) | ORAL | Status: AC

## 2013-07-24 NOTE — Progress Notes (Signed)
Pre visit review using our clinic review tool, if applicable. No additional management support is needed unless otherwise documented below in the visit note. 

## 2013-07-25 ENCOUNTER — Encounter: Payer: Self-pay | Admitting: Family Medicine

## 2013-07-25 NOTE — Progress Notes (Signed)
   Subjective:    Patient ID: Alex Ryan, male    DOB: [birth year], 43 y.o.   MRN: [id number]  HPI Here for another bout of cellulitis in the left lower leg. The leg started to swell and get painful 3 days ago. Now it is red and warm.    Review of Systems  Constitutional: Negative.   Respiratory: Negative.   Cardiovascular: Positive for leg swelling. Negative for chest pain and palpitations.  Skin: Positive for color change.       Objective:   Physical Exam  Constitutional: He appears well-developed and well-nourished.  Cardiovascular: Normal rate, regular rhythm, normal heart sounds and intact distal pulses.   Pulmonary/Chest: Effort normal and breath sounds normal.  Musculoskeletal:  Left lower leg is swollen, red, warm, and tender. No cords felt           Assessment & Plan:  Elevate the leg. Start on Keflex 500 mg qid. Recheck in one week

## 2013-09-07 ENCOUNTER — Ambulatory Visit: Payer: 59 | Admitting: Family Medicine

## 2013-09-21 ENCOUNTER — Ambulatory Visit (INDEPENDENT_AMBULATORY_CARE_PROVIDER_SITE_OTHER): Payer: 59 | Admitting: Family Medicine

## 2013-09-21 ENCOUNTER — Encounter: Payer: Self-pay | Admitting: Family Medicine

## 2013-09-21 VITALS — BP 137/83 | HR 67 | Temp 98.1°F | Ht 70.0 in | Wt 242.0 lb

## 2013-09-21 DIAGNOSIS — R5383 Other fatigue: Secondary | ICD-10-CM

## 2013-09-21 DIAGNOSIS — E119 Type 2 diabetes mellitus without complications: Secondary | ICD-10-CM

## 2013-09-21 DIAGNOSIS — L659 Nonscarring hair loss, unspecified: Secondary | ICD-10-CM

## 2013-09-21 DIAGNOSIS — R5381 Other malaise: Secondary | ICD-10-CM

## 2013-09-21 DIAGNOSIS — I10 Essential (primary) hypertension: Secondary | ICD-10-CM

## 2013-09-21 LAB — CBC WITH DIFFERENTIAL/PLATELET
Basophils Absolute: 0.1 10*3/uL (ref 0.0–0.1)
Basophils Relative: 0.9 % (ref 0.0–3.0)
Eosinophils Absolute: 0.5 10*3/uL (ref 0.0–0.7)
Eosinophils Relative: 5.5 % — ABNORMAL HIGH (ref 0.0–5.0)
HCT: 42.4 % (ref 39.0–52.0)
Hemoglobin: 14.4 g/dL (ref 13.0–17.0)
LYMPHS PCT: 31.7 % (ref 12.0–46.0)
Lymphs Abs: 2.9 10*3/uL (ref 0.7–4.0)
MCHC: 34 g/dL (ref 30.0–36.0)
MCV: 92.2 fl (ref 78.0–100.0)
Monocytes Absolute: 1 10*3/uL (ref 0.1–1.0)
Monocytes Relative: 11 % (ref 3.0–12.0)
Neutro Abs: 4.7 10*3/uL (ref 1.4–7.7)
Neutrophils Relative %: 50.9 % (ref 43.0–77.0)
PLATELETS: 273 10*3/uL (ref 150.0–400.0)
RBC: 4.6 Mil/uL (ref 4.22–5.81)
RDW: 12.7 % (ref 11.5–15.5)
WBC: 9.2 10*3/uL (ref 4.0–10.5)

## 2013-09-21 LAB — LIPID PANEL
CHOLESTEROL: 197 mg/dL (ref 0–200)
HDL: 48.3 mg/dL (ref >39.00)
LDL Cholesterol: 123 mg/dL — ABNORMAL HIGH (ref 0–99)
TRIGLYCERIDES: 131 mg/dL (ref 0.0–149.0)
Total CHOL/HDL Ratio: 4
VLDL: 26.2 mg/dL (ref 0.0–40.0)

## 2013-09-21 LAB — BASIC METABOLIC PANEL
BUN: 12 mg/dL (ref 6–23)
CALCIUM: 9.1 mg/dL (ref 8.4–10.5)
CO2: 31 mEq/L (ref 19–32)
CREATININE: 0.8 mg/dL (ref 0.4–1.5)
Chloride: 102 mEq/L (ref 96–112)
GFR: 113.41 mL/min (ref >60.00)
Glucose, Bld: 171 mg/dL — ABNORMAL HIGH (ref 70–99)
Potassium: 4.1 mEq/L (ref 3.5–5.1)
SODIUM: 140 meq/L (ref 135–145)

## 2013-09-21 LAB — HEPATIC FUNCTION PANEL
ALK PHOS: 130 U/L — AB (ref 39–117)
ALT: 32 U/L (ref 0–53)
AST: 27 U/L (ref 0–37)
Albumin: 3.3 g/dL — ABNORMAL LOW (ref 3.5–5.2)
Bilirubin, Direct: 0.1 mg/dL (ref 0.0–0.3)
Total Bilirubin: 0.9 mg/dL (ref 0.2–1.2)
Total Protein: 7 g/dL (ref 6.0–8.3)

## 2013-09-21 LAB — TESTOSTERONE: TESTOSTERONE: 194.85 ng/dL — AB (ref 300.00–890.00)

## 2013-09-21 LAB — TSH: TSH: 0.85 u[IU]/mL (ref 0.35–4.50)

## 2013-09-21 NOTE — Progress Notes (Signed)
   Subjective:    Patient ID: Alex Ryan, male    DOB: [birth year], 43 y.o.   MRN: [id number]  HPI Here to get fasting lab work. He has not had his cholesterol checked in a while. He sees Dr. Chalmers Cater regularly for his diabetes. He has had some thinning of his hair lately and he wants his thyroid checked. He feels well.    Review of Systems  Constitutional: Negative.   Respiratory: Negative.   Cardiovascular: Negative.   Endocrine: Negative.        Objective:   Physical Exam  Constitutional: He appears well-developed and well-nourished.  Neck: No thyromegaly present.  Cardiovascular: Normal rate, regular rhythm, normal heart sounds and intact distal pulses.   Pulmonary/Chest: Effort normal and breath sounds normal.  Lymphadenopathy:    He has no cervical adenopathy.          Assessment & Plan:  Get labs as above. Advised him to take a multivitamin daily along with Biotin.

## 2013-09-21 NOTE — Progress Notes (Signed)
Pre visit review using our clinic review tool, if applicable. No additional management support is needed unless otherwise documented below in the visit note. 

## 2013-09-22 ENCOUNTER — Telehealth: Payer: Self-pay | Admitting: Family Medicine

## 2013-09-22 NOTE — Telephone Encounter (Signed)
Relevant patient education assigned to patient using Emmi. ° °

## 2013-09-26 ENCOUNTER — Other Ambulatory Visit: Payer: Self-pay | Admitting: *Deleted

## 2013-09-26 MED ORDER — TESTOSTERONE CYPIONATE 200 MG/ML IM SOLN
200.0000 mg | INTRAMUSCULAR | Status: DC
Start: 2013-09-26 — End: 2014-07-26

## 2013-09-26 MED ORDER — "SYRINGE/NEEDLE (DISP) 22G X 1-1/2"" 1 ML MISC": 1.0000 | Status: DC

## 2013-09-29 ENCOUNTER — Ambulatory Visit (INDEPENDENT_AMBULATORY_CARE_PROVIDER_SITE_OTHER): Payer: Self-pay | Admitting: Family Medicine

## 2013-09-29 DIAGNOSIS — E291 Testicular hypofunction: Secondary | ICD-10-CM

## 2013-09-29 DIAGNOSIS — R7989 Other specified abnormal findings of blood chemistry: Secondary | ICD-10-CM

## 2013-09-29 MED ORDER — TESTOSTERONE CYPIONATE 100 MG/ML IM SOLN
200.0000 mg | Freq: Once | INTRAMUSCULAR | Status: AC
  Administered 2013-09-29: 200 mg via INTRAMUSCULAR

## 2013-10-10 ENCOUNTER — Telehealth: Payer: Self-pay

## 2013-10-10 NOTE — Telephone Encounter (Signed)
Relevant patient education assigned to patient using Emmi. ° °

## 2013-10-25 ENCOUNTER — Other Ambulatory Visit: Payer: Self-pay | Admitting: Family Medicine

## 2013-10-27 ENCOUNTER — Ambulatory Visit (INDEPENDENT_AMBULATORY_CARE_PROVIDER_SITE_OTHER): Payer: Self-pay | Admitting: Family Medicine

## 2013-10-27 DIAGNOSIS — E349 Endocrine disorder, unspecified: Secondary | ICD-10-CM

## 2013-10-27 DIAGNOSIS — E291 Testicular hypofunction: Secondary | ICD-10-CM

## 2013-10-27 MED ORDER — TESTOSTERONE CYPIONATE 100 MG/ML IM SOLN
200.0000 mg | Freq: Once | INTRAMUSCULAR | Status: AC
  Administered 2013-10-27: 200 mg via INTRAMUSCULAR

## 2013-11-24 ENCOUNTER — Ambulatory Visit: Payer: Self-pay | Admitting: Family Medicine

## 2013-11-24 ENCOUNTER — Other Ambulatory Visit: Payer: Self-pay | Admitting: Family Medicine

## 2013-11-24 ENCOUNTER — Telehealth: Payer: Self-pay | Admitting: Family Medicine

## 2013-11-24 DIAGNOSIS — R7989 Other specified abnormal findings of blood chemistry: Secondary | ICD-10-CM

## 2013-11-24 MED ORDER — CEPHALEXIN 500 MG PO CAPS: 500.0000 mg | ORAL_CAPSULE | Freq: Three times a day (TID) | ORAL | Status: AC

## 2013-11-24 MED ORDER — TESTOSTERONE CYPIONATE 100 MG/ML IM SOLN
200.0000 mg | Freq: Once | INTRAMUSCULAR | Status: AC
  Administered 2013-11-24: 200 mg via INTRAMUSCULAR

## 2013-11-24 NOTE — Telephone Encounter (Signed)
He has some infection on the left 4th finger from pulling a hangnail, so we will call in some Keflex. Recheck prn

## 2013-11-29 ENCOUNTER — Ambulatory Visit: Payer: Self-pay | Admitting: Family Medicine

## 2013-12-07 ENCOUNTER — Telehealth: Payer: Self-pay | Admitting: *Deleted

## 2013-12-07 DIAGNOSIS — E119 Type 2 diabetes mellitus without complications: Secondary | ICD-10-CM

## 2013-12-07 DIAGNOSIS — E349 Endocrine disorder, unspecified: Secondary | ICD-10-CM

## 2013-12-07 NOTE — Telephone Encounter (Signed)
Patient will go to the [facility] lab and also requests to have his testosterone checked A1c, bmet, micro albumin, testosterone ordered Diabetic bundle

## 2013-12-08 ENCOUNTER — Other Ambulatory Visit: Payer: Self-pay | Admitting: Family Medicine

## 2013-12-08 DIAGNOSIS — E349 Endocrine disorder, unspecified: Secondary | ICD-10-CM

## 2013-12-08 DIAGNOSIS — E119 Type 2 diabetes mellitus without complications: Secondary | ICD-10-CM

## 2013-12-11 ENCOUNTER — Other Ambulatory Visit (INDEPENDENT_AMBULATORY_CARE_PROVIDER_SITE_OTHER): Payer: Self-pay

## 2013-12-11 DIAGNOSIS — E119 Type 2 diabetes mellitus without complications: Secondary | ICD-10-CM

## 2013-12-11 DIAGNOSIS — R7989 Other specified abnormal findings of blood chemistry: Secondary | ICD-10-CM

## 2013-12-11 DIAGNOSIS — E291 Testicular hypofunction: Secondary | ICD-10-CM

## 2013-12-11 LAB — BASIC METABOLIC PANEL
BUN: 13 mg/dL (ref 6–23)
CO2: 32 mEq/L (ref 19–32)
CREATININE: 0.9 mg/dL (ref 0.4–1.5)
Calcium: 9.4 mg/dL (ref 8.4–10.5)
Chloride: 100 mEq/L (ref 96–112)
GFR: 101.36 mL/min (ref >60.00)
Glucose, Bld: 206 mg/dL — ABNORMAL HIGH (ref 70–99)
POTASSIUM: 4.3 meq/L (ref 3.5–5.1)
Sodium: 141 mEq/L (ref 135–145)

## 2013-12-11 LAB — MICROALBUMIN / CREATININE URINE RATIO
Creatinine,U: 30.3 mg/dL
MICROALB UR: 66.6 mg/dL — AB (ref 0.0–1.9)
Microalb Creat Ratio: 219.6 mg/g — ABNORMAL HIGH (ref 0.0–30.0)

## 2013-12-11 LAB — HEMOGLOBIN A1C: Hgb A1c MFr Bld: 11.3 % — ABNORMAL HIGH (ref 4.6–6.5)

## 2013-12-11 LAB — TESTOSTERONE: TESTOSTERONE: 179.58 ng/dL — AB (ref 300.00–890.00)

## 2013-12-13 ENCOUNTER — Encounter: Payer: Self-pay | Admitting: Family Medicine

## 2013-12-13 ENCOUNTER — Ambulatory Visit (INDEPENDENT_AMBULATORY_CARE_PROVIDER_SITE_OTHER): Payer: Self-pay | Admitting: Family Medicine

## 2013-12-13 VITALS — BP 137/78 | HR 66 | Ht 70.0 in | Wt 245.0 lb

## 2013-12-13 DIAGNOSIS — G909 Disorder of the autonomic nervous system, unspecified: Secondary | ICD-10-CM

## 2013-12-13 DIAGNOSIS — E1349 Other specified diabetes mellitus with other diabetic neurological complication: Secondary | ICD-10-CM

## 2013-12-13 DIAGNOSIS — I10 Essential (primary) hypertension: Secondary | ICD-10-CM

## 2013-12-13 DIAGNOSIS — E119 Type 2 diabetes mellitus without complications: Secondary | ICD-10-CM

## 2013-12-13 DIAGNOSIS — E0843 Diabetes mellitus due to underlying condition with diabetic autonomic (poly)neuropathy: Secondary | ICD-10-CM

## 2013-12-13 DIAGNOSIS — F411 Generalized anxiety disorder: Secondary | ICD-10-CM

## 2013-12-13 MED ORDER — CEPHALEXIN 500 MG PO CAPS: 500.0000 mg | ORAL_CAPSULE | Freq: Three times a day (TID) | ORAL | Status: DC

## 2013-12-13 MED ORDER — FLUOXETINE HCL 40 MG PO CAPS: 40.0000 mg | ORAL_CAPSULE | Freq: Every day | ORAL | Status: DC

## 2013-12-13 MED ORDER — INSULIN LISPRO 100 UNIT/ML ~~LOC~~ SOLN: SUBCUTANEOUS | Status: DC

## 2013-12-13 MED ORDER — GABAPENTIN 300 MG PO CAPS: 300.0000 mg | ORAL_CAPSULE | Freq: Three times a day (TID) | ORAL | Status: DC

## 2013-12-13 NOTE — Progress Notes (Signed)
   Subjective:    Patient ID: Alex Ryan, male    DOB: [birth year], 43 y.o.   MRN: [id number]  HPI Here for several things. First his recent A1c has jumped up to 11, and he say he is following his diet as tightly as he can. His neuropathy has gotten worse and now he has burning and tingling in his hands and arms, in addition to his legs. Also he has been under a lot of stress, mostly financial, and his Prozac is not working as well as before.    Review of Systems  Constitutional: Negative.   Respiratory: Negative.   Cardiovascular: Negative.   Psychiatric/Behavioral: Positive for agitation. Negative for dysphoric mood. The patient is nervous/anxious.        Objective:   Physical Exam  Constitutional: He appears well-developed and well-nourished.  Cardiovascular: Normal rate, regular rhythm, normal heart sounds and intact distal pulses.   Pulmonary/Chest: Effort normal and breath sounds normal.  Psychiatric: He has a normal mood and affect. His behavior is normal. Thought content normal.          Assessment & Plan:  We will increase his Humalog to 45 units with breakfast, 10 with lunch, and to 45 with dinner. He will see Dr. Chalmers Cater next month. Increase Prozac to 40 mg bid. Increase Gabapentin to 300 mg tid.

## 2013-12-13 NOTE — Progress Notes (Signed)
Pre visit review using our clinic review tool, if applicable. No additional management support is needed unless otherwise documented below in the visit note. 

## 2013-12-25 ENCOUNTER — Ambulatory Visit (INDEPENDENT_AMBULATORY_CARE_PROVIDER_SITE_OTHER): Payer: Self-pay | Admitting: Family Medicine

## 2013-12-25 DIAGNOSIS — R7989 Other specified abnormal findings of blood chemistry: Secondary | ICD-10-CM

## 2013-12-25 DIAGNOSIS — E291 Testicular hypofunction: Secondary | ICD-10-CM

## 2013-12-25 MED ORDER — TESTOSTERONE CYPIONATE 100 MG/ML IM SOLN
200.0000 mg | Freq: Once | INTRAMUSCULAR | Status: AC
  Administered 2013-12-25: 200 mg via INTRAMUSCULAR

## 2014-01-16 ENCOUNTER — Ambulatory Visit (INDEPENDENT_AMBULATORY_CARE_PROVIDER_SITE_OTHER): Payer: Self-pay | Admitting: Family Medicine

## 2014-01-16 DIAGNOSIS — Z111 Encounter for screening for respiratory tuberculosis: Secondary | ICD-10-CM

## 2014-01-19 LAB — TB SKIN TEST
Induration: 0 mm
TB SKIN TEST: NEGATIVE

## 2014-01-23 ENCOUNTER — Encounter: Payer: Self-pay | Admitting: Family Medicine

## 2014-01-23 NOTE — Progress Notes (Signed)
Patient ID: Alex Ryan, male   DOB: [birth year], 43 y.o.   MRN: [id number] Reviewed: Agree with the documentation and management of our [place].

## 2014-01-26 ENCOUNTER — Ambulatory Visit (INDEPENDENT_AMBULATORY_CARE_PROVIDER_SITE_OTHER): Payer: Self-pay | Admitting: Family Medicine

## 2014-01-26 DIAGNOSIS — R350 Frequency of micturition: Secondary | ICD-10-CM

## 2014-01-26 DIAGNOSIS — E291 Testicular hypofunction: Secondary | ICD-10-CM

## 2014-01-26 DIAGNOSIS — E349 Endocrine disorder, unspecified: Secondary | ICD-10-CM

## 2014-01-26 LAB — POCT URINALYSIS DIPSTICK
BILIRUBIN UA: NEGATIVE
Glucose, UA: 2
KETONES UA: NEGATIVE
Nitrite, UA: NEGATIVE
SPEC GRAV UA: 1.025
UROBILINOGEN UA: 0.2
pH, UA: 6

## 2014-01-26 MED ORDER — TESTOSTERONE CYPIONATE 200 MG/ML IM SOLN
200.0000 mg | Freq: Once | INTRAMUSCULAR | Status: AC
  Administered 2014-01-26: 200 mg via INTRAMUSCULAR

## 2014-01-30 LAB — URINE CULTURE: Colony Count: >=100000

## 2014-01-30 MED ORDER — CIPROFLOXACIN HCL 500 MG PO TABS: 500.0000 mg | ORAL_TABLET | Freq: Two times a day (BID) | ORAL | Status: DC

## 2014-01-30 NOTE — Addendum Note (Signed)
Addended by: Aggie Hacker A on: 01/30/2014 03:54 PM   Modules accepted: Orders

## 2014-02-05 ENCOUNTER — Encounter: Payer: Self-pay | Admitting: Family Medicine

## 2014-02-05 ENCOUNTER — Ambulatory Visit (INDEPENDENT_AMBULATORY_CARE_PROVIDER_SITE_OTHER): Payer: Self-pay | Admitting: Family Medicine

## 2014-02-05 VITALS — BP 115/64 | HR 67 | Temp 98.3°F | Ht 70.0 in | Wt 240.0 lb

## 2014-02-05 DIAGNOSIS — E119 Type 2 diabetes mellitus without complications: Secondary | ICD-10-CM

## 2014-02-05 DIAGNOSIS — F411 Generalized anxiety disorder: Secondary | ICD-10-CM

## 2014-02-05 DIAGNOSIS — I10 Essential (primary) hypertension: Secondary | ICD-10-CM

## 2014-02-05 DIAGNOSIS — Z23 Encounter for immunization: Secondary | ICD-10-CM

## 2014-02-05 DIAGNOSIS — R079 Chest pain, unspecified: Secondary | ICD-10-CM

## 2014-02-05 MED ORDER — DIAZEPAM 5 MG PO TABS: 5.0000 mg | ORAL_TABLET | Freq: Two times a day (BID) | ORAL | Status: DC | PRN

## 2014-02-05 NOTE — Progress Notes (Signed)
Pre visit review using our clinic review tool, if applicable. No additional management support is needed unless otherwise documented below in the visit note. 

## 2014-02-05 NOTE — Progress Notes (Signed)
   Subjective:    Patient ID: Alex Ryan, male    DOB: [birth year], 43 y.o.   MRN: [id number]  HPI Here to discuss SOB and chest pains he has been having for several weeks. He has admittedly been under a lot of stress lately, and in fact we recently increased his Prozac to 40 mg bid to help this. However over the past weekend he had several episodes of sudden pressure on the chest with some dull discomfort and some SOB. No nausea or sweats. On a few occasions he had some dull pains radiate down the left arm. Today he feels fine.    Review of Systems  Constitutional: Negative.   Respiratory: Positive for chest tightness and shortness of breath. Negative for cough and wheezing.   Cardiovascular: Positive for chest pain. Negative for palpitations and leg swelling.  Neurological: Negative.   Psychiatric/Behavioral: Negative for hallucinations, behavioral problems, confusion, dysphoric mood, decreased concentration and agitation. The patient is nervous/anxious.        Objective:   Physical Exam  Constitutional: He is oriented to person, place, and time. He appears well-developed and well-nourished. No distress.  Neck: No thyromegaly present.  Cardiovascular: Normal rate, regular rhythm, normal heart sounds and intact distal pulses.   Pulmonary/Chest: Effort normal and breath sounds normal. No respiratory distress. He has no wheezes. He has no rales. He exhibits no tenderness.  Lymphadenopathy:    He has no cervical adenopathy.  Neurological: He is alert and oriented to person, place, and time.  Psychiatric: He has a normal mood and affect. His behavior is normal. Thought content normal.          Assessment & Plan:  I feel these episodes are most likely the result of anxiety attacks so we will add Valium 5 mg to use prn on top of the Prozac. However we need to make sure these are not the result of cardiac ischemia since he is a diabetic and may have atypical symptoms. We will set him up  for a nuclear stress test soon.

## 2014-02-16 ENCOUNTER — Encounter (HOSPITAL_COMMUNITY): Payer: Self-pay

## 2014-02-19 ENCOUNTER — Encounter (HOSPITAL_COMMUNITY): Payer: Self-pay

## 2014-03-06 ENCOUNTER — Ambulatory Visit (HOSPITAL_COMMUNITY): Payer: Self-pay | Attending: Family Medicine | Admitting: Radiology

## 2014-03-06 VITALS — BP 223/95 | Ht 70.0 in | Wt 240.0 lb

## 2014-03-06 DIAGNOSIS — R0602 Shortness of breath: Secondary | ICD-10-CM | POA: Insufficient documentation

## 2014-03-06 DIAGNOSIS — E119 Type 2 diabetes mellitus without complications: Secondary | ICD-10-CM | POA: Insufficient documentation

## 2014-03-06 DIAGNOSIS — R079 Chest pain, unspecified: Secondary | ICD-10-CM | POA: Insufficient documentation

## 2014-03-06 DIAGNOSIS — I10 Essential (primary) hypertension: Secondary | ICD-10-CM | POA: Insufficient documentation

## 2014-03-06 MED ORDER — TECHNETIUM TC 99M SESTAMIBI GENERIC - CARDIOLITE
11.0000 | Freq: Once | INTRAVENOUS | Status: AC | PRN
  Administered 2014-03-06: 11 via INTRAVENOUS

## 2014-03-06 MED ORDER — TECHNETIUM TC 99M SESTAMIBI GENERIC - CARDIOLITE
33.0000 | Freq: Once | INTRAVENOUS | Status: AC | PRN
  Administered 2014-03-06: 33 via INTRAVENOUS

## 2014-03-06 MED ORDER — REGADENOSON 0.4 MG/5ML IV SOLN
0.4000 mg | Freq: Once | INTRAVENOUS | Status: AC
  Administered 2014-03-06: 0.4 mg via INTRAVENOUS

## 2014-03-06 NOTE — Progress Notes (Signed)
[place] [facility] [address] [place], [place] [address] [phone]    Cardiology Nuclear Med Study  Alex Ryan is a 43 y.o. male     MRN : [id number]     DOB: [birth year]  Procedure Date: 03/06/2014  Nuclear Med Background Indication for Stress Test:  Evaluation for Ischemia History:  Asthma and - Cardiac Risk Factors: Hypertension and IDDM   Symptoms:  Chest Pain and SOB   Nuclear Pre-Procedure Caffeine/Decaff Intake:  None> 12 hrs NPO After: 8:00pm   Lungs:  clear O2 Sat: 97% on room air. IV 0.9% NS with Angio Cath:  20g  IV Site: R Hand x 1, tolerated well IV Started by:  Irven Baltimore, RN  Chest Size (in):  50 Cup Size: n/a  Height: 5\' 10"  (1.778 m)  Weight:  240 lb (108.863 kg)  BMI:  Body mass index is 34.44 kg/(m^2). Tech Comments:  Patient held Toprol x 24 hrs .Patient took 1/2 dose of Lantus Insulin last night, and no insulin or Invokamet today. Fasting CBG was 163 at 0645 today. Irven Baltimore, RN.    Nuclear Med Study 1 or 2 day study: 1 day  Stress Test Type:  Stress  Reading MD: N/A  Order Authorizing Provider:  Alysia Penna, MD  Resting Radionuclide: Technetium 23m Sestamibi  Resting Radionuclide Dose: 11.0 mCi   Stress Radionuclide:  Technetium 79m Sestamibi  Stress Radionuclide Dose: 33.0 mCi           Stress Protocol Rest HR: 137 Stress HR: 137  Rest BP: 119/72 Stress BP: 223/95  Exercise Time (min): 7:18 METS: 6.6   Predicted Max HR: 176 bpm % Max HR: 77.84 bpm Rate Pressure Product: 30551   Dose of Adenosine (mg):  n/a Dose of Lexiscan: 0.4 mg  Dose of Atropine (mg): n/a Dose of Dobutamine: n/a mcg/kg/min (at max HR)  Stress Test Technologist: Perrin Maltese, EMT-P  Nuclear Technologist:  Margie Ege     Rest Procedure:  Myocardial perfusion imaging was performed at rest 45 minutes following the intravenous administration of Technetium 78m Sestamibi. Rest ECG: NSR - Normal EKG  Stress Procedure:  The  patient received IV Lexiscan 0.4 mg over 15-seconds with concurrent low level exercise and then Technetium 59m Sestamibi was injected at 30-seconds while the patient continued walking one more minute.  This patient had sob , fatigue, and was unable to keep up with the treadmill. The patient was switched to a walking Lexiscan.Quantitative spect images were obtained after a 45-minute delay. Stress ECG: No significant change from baseline ECG  QPS Raw Data Images:  Normal; no motion artifact; normal heart/lung ratio. Stress Images:  Normal homogeneous uptake in all areas of the myocardium. Rest Images:  Normal homogeneous uptake in all areas of the myocardium. Subtraction (SDS):  No evidence of ischemia. Transient Ischemic Dilatation (Normal <1.22):  0.90 Lung/Heart Ratio (Normal <0.45):  0.41  Quantitative Gated Spect Images QGS EDV:  140 ml QGS ESV:  52 ml  Impression Exercise Capacity:  Good exercise capacity. BP Response:  Hypertensive blood pressure response. Clinical Symptoms:  No chest pain. ECG Impression:  No significant ST segment change suggestive of ischemia. Comparison with Prior Nuclear Study: No previous nuclear study performed  Overall Impression:  Low risk stress nuclear study.  Hypertensive response to exercise. Frequent PVCs with occasional couplets and triplets with exercise.  No ischemic ST depression. No evidence of ischemia by perfusion imaging.  LV Ejection Fraction: 63%.  LV  Wall Motion:  NL LV Function; NL Wall Motion  Darlin Coco MD

## 2014-03-14 ENCOUNTER — Ambulatory Visit (INDEPENDENT_AMBULATORY_CARE_PROVIDER_SITE_OTHER): Payer: Self-pay | Admitting: Family Medicine

## 2014-03-14 ENCOUNTER — Encounter: Payer: Self-pay | Admitting: Family Medicine

## 2014-03-14 VITALS — BP 122/67 | HR 77 | Temp 98.5°F | Ht 70.0 in | Wt 242.0 lb

## 2014-03-14 DIAGNOSIS — L03116 Cellulitis of left lower limb: Secondary | ICD-10-CM

## 2014-03-14 DIAGNOSIS — J0100 Acute maxillary sinusitis, unspecified: Secondary | ICD-10-CM

## 2014-03-14 MED ORDER — AMOXICILLIN-POT CLAVULANATE 875-125 MG PO TABS
1.0000 | ORAL_TABLET | Freq: Two times a day (BID) | ORAL | Status: DC
Start: 2014-03-14 — End: 2014-11-08

## 2014-03-14 MED ORDER — HYDROCODONE-HOMATROPINE 5-1.5 MG/5ML PO SYRP: 5.0000 mL | ORAL_SOLUTION | ORAL | Status: DC | PRN

## 2014-03-14 NOTE — Progress Notes (Signed)
   Subjective:    Patient ID: Alex Ryan, male    DOB: 1971/01/10, 43 y.o.   MRN: [id number]  HPI Here for 5 days of sinus pressure, PND, and coughing up yellow sputum. No fever. Also for 3 days the left lower leg has been red and swollen.    Review of Systems  Constitutional: Negative.   HENT: Positive for congestion, postnasal drip and sinus pressure.   Eyes: Negative.   Respiratory: Positive for cough.        Objective:   Physical Exam  Constitutional: He appears well-developed and well-nourished.  HENT:  Right Ear: External ear normal.  Left Ear: External ear normal.  Nose: Nose normal.  Mouth/Throat: Oropharynx is clear and moist.  Eyes: Conjunctivae are normal.  Pulmonary/Chest: Effort normal and breath sounds normal.  Lymphadenopathy:    He has no cervical adenopathy.  Skin:  Left lower leg is red, warm, swollen and mildly tender          Assessment & Plan:  Hopefully Augmentin will address both problems. Recheck prn

## 2014-03-14 NOTE — Progress Notes (Signed)
Pre visit review using our clinic review tool, if applicable. No additional management support is needed unless otherwise documented below in the visit note. 

## 2014-03-27 ENCOUNTER — Telehealth: Payer: Self-pay | Admitting: Family Medicine

## 2014-03-27 MED ORDER — CEPHALEXIN 500 MG PO CAPS: 500.0000 mg | ORAL_CAPSULE | Freq: Four times a day (QID) | ORAL | Status: DC

## 2014-03-27 MED ORDER — BENZONATATE 200 MG PO CAPS: 200.0000 mg | ORAL_CAPSULE | Freq: Two times a day (BID) | ORAL | Status: DC | PRN

## 2014-03-27 NOTE — Telephone Encounter (Signed)
Call in Keflex 500 mg QID, # 40 with no rf. Also call in Benzonatate 200 mg BID prn cough, #60 with no rf

## 2014-03-27 NOTE — Telephone Encounter (Signed)
I spoke with pt and left leg has blisters returning, painful & red. Pt would like to try the Keflex for his leg again because it has worked in the past. Also pt is still coughing, would like to be able to take during the day. Pt new number is [phone].

## 2014-03-27 NOTE — Telephone Encounter (Signed)
I sent both scripts e-scribe and spoke with pt. 

## 2014-05-09 ENCOUNTER — Ambulatory Visit (INDEPENDENT_AMBULATORY_CARE_PROVIDER_SITE_OTHER): Payer: Self-pay | Admitting: *Deleted

## 2014-05-09 DIAGNOSIS — R7989 Other specified abnormal findings of blood chemistry: Secondary | ICD-10-CM

## 2014-05-09 DIAGNOSIS — E291 Testicular hypofunction: Secondary | ICD-10-CM

## 2014-05-09 MED ORDER — TESTOSTERONE CYPIONATE 200 MG/ML IM SOLN
100.0000 mg | Freq: Once | INTRAMUSCULAR | Status: AC
  Administered 2014-05-09: 100 mg via INTRAMUSCULAR

## 2014-05-25 ENCOUNTER — Telehealth: Payer: Self-pay | Admitting: Family Medicine

## 2014-05-25 MED ORDER — CEPHALEXIN 500 MG PO CAPS: 500.0000 mg | ORAL_CAPSULE | Freq: Four times a day (QID) | ORAL | Status: DC

## 2014-05-25 NOTE — Telephone Encounter (Signed)
I did send in refill e-scribe and spoke with pt.

## 2014-05-25 NOTE — Telephone Encounter (Signed)
Pt called to ask for a rx  KEFLEX he said it was for the same reason   Pharmacy [facility]

## 2014-05-25 NOTE — Telephone Encounter (Signed)
Per Dr. Sarajane Jews okay to give a refill with same previous # 40 and take qid.

## 2014-05-29 ENCOUNTER — Encounter: Payer: Self-pay | Admitting: Family Medicine

## 2014-05-29 ENCOUNTER — Ambulatory Visit (INDEPENDENT_AMBULATORY_CARE_PROVIDER_SITE_OTHER): Payer: Self-pay | Admitting: Family Medicine

## 2014-05-29 VITALS — BP 190/106 | HR 88 | Temp 98.3°F | Ht 70.0 in | Wt 246.0 lb

## 2014-05-29 DIAGNOSIS — E119 Type 2 diabetes mellitus without complications: Secondary | ICD-10-CM

## 2014-05-29 DIAGNOSIS — I10 Essential (primary) hypertension: Secondary | ICD-10-CM

## 2014-05-29 MED ORDER — FLUOXETINE HCL 40 MG PO CAPS: 40.0000 mg | ORAL_CAPSULE | Freq: Every day | ORAL | Status: DC

## 2014-05-29 MED ORDER — METOPROLOL SUCCINATE ER 100 MG PO TB24
ORAL_TABLET | ORAL | Status: DC
Start: 2014-05-29 — End: 2015-02-18

## 2014-05-29 NOTE — Progress Notes (Signed)
   Subjective:    Patient ID: Alex Ryan, male    DOB: 06/27/1970, 44 y.o.   MRN: [id number]  HPI Here asking for advise. His medical insurance will not be effective until 06-18-14 and he has run out of meds. He asks me to send in refills of several meds and he asks if we have insulin samples to cover the next 3 weeks.    Review of Systems  Constitutional: Negative.   Respiratory: Negative.   Cardiovascular: Negative.        Objective:   Physical Exam  Constitutional: He appears well-developed and well-nourished.  Cardiovascular: Normal rate, regular rhythm, normal heart sounds and intact distal pulses.   Pulmonary/Chest: Effort normal and breath sounds normal.          Assessment & Plan:  We wrote refills for metoprolol and fluoxetine for the next few weeks. Given samples of Levemir to use the same dosing as the Lantus.

## 2014-05-29 NOTE — Progress Notes (Signed)
Pre visit review using our clinic review tool, if applicable. No additional management support is needed unless otherwise documented below in the visit note. 

## 2014-06-04 ENCOUNTER — Other Ambulatory Visit: Payer: Self-pay | Admitting: Family Medicine

## 2014-06-07 ENCOUNTER — Telehealth: Payer: Self-pay

## 2014-06-07 MED ORDER — INSULIN DETEMIR 100 UNIT/ML FLEXPEN: PEN_INJECTOR | SUBCUTANEOUS | Status: DC

## 2014-06-07 NOTE — Telephone Encounter (Signed)
Pt called and states he was given a Levemir sample and he will run out on tomorrow.  Advised pt that we could give him another sample.  Pt is aware and will come and get the sample today.

## 2014-06-20 ENCOUNTER — Ambulatory Visit (INDEPENDENT_AMBULATORY_CARE_PROVIDER_SITE_OTHER): Payer: Self-pay | Admitting: Family Medicine

## 2014-06-20 DIAGNOSIS — R7989 Other specified abnormal findings of blood chemistry: Secondary | ICD-10-CM

## 2014-06-20 DIAGNOSIS — E291 Testicular hypofunction: Secondary | ICD-10-CM

## 2014-06-20 MED ORDER — TESTOSTERONE CYPIONATE 100 MG/ML IM SOLN
200.0000 mg | Freq: Once | INTRAMUSCULAR | Status: AC
  Administered 2014-06-20: 200 mg via INTRAMUSCULAR

## 2014-07-05 ENCOUNTER — Telehealth: Payer: Self-pay | Admitting: Family Medicine

## 2014-07-05 NOTE — Telephone Encounter (Signed)
Pt states he has been getting samples from you for Insulin Detemir (LEVEMIR FLEXPEN) 100 UNIT/ML Pen Would like to know if you have any?

## 2014-07-06 NOTE — Telephone Encounter (Signed)
I left a voice message for pt, we do not have any samples.

## 2014-07-10 ENCOUNTER — Other Ambulatory Visit: Payer: Self-pay | Admitting: Family Medicine

## 2014-07-11 ENCOUNTER — Telehealth: Payer: Self-pay | Admitting: Family Medicine

## 2014-07-11 MED ORDER — LOSARTAN POTASSIUM 50 MG PO TABS: ORAL_TABLET | ORAL | Status: DC

## 2014-07-11 NOTE — Telephone Encounter (Signed)
I sent script e-scribe. 

## 2014-07-11 NOTE — Telephone Encounter (Signed)
Pt request refill losartan (COZAAR) 50 MG tablet 90 day  [place] [facility] out pt

## 2014-07-18 ENCOUNTER — Other Ambulatory Visit (INDEPENDENT_AMBULATORY_CARE_PROVIDER_SITE_OTHER): Payer: Self-pay

## 2014-07-18 ENCOUNTER — Ambulatory Visit (INDEPENDENT_AMBULATORY_CARE_PROVIDER_SITE_OTHER): Payer: Self-pay | Admitting: Family Medicine

## 2014-07-18 DIAGNOSIS — E349 Endocrine disorder, unspecified: Secondary | ICD-10-CM

## 2014-07-18 DIAGNOSIS — R7989 Other specified abnormal findings of blood chemistry: Secondary | ICD-10-CM

## 2014-07-18 DIAGNOSIS — E291 Testicular hypofunction: Secondary | ICD-10-CM

## 2014-07-18 DIAGNOSIS — E119 Type 2 diabetes mellitus without complications: Secondary | ICD-10-CM

## 2014-07-18 LAB — HEMOGLOBIN A1C: HEMOGLOBIN A1C: 11.6 % — AB (ref 4.6–6.5)

## 2014-07-18 LAB — BASIC METABOLIC PANEL
BUN: 18 mg/dL (ref 6–23)
CO2: 32 meq/L (ref 19–32)
CREATININE: 1.09 mg/dL (ref 0.40–1.50)
Calcium: 9.9 mg/dL (ref 8.4–10.5)
Chloride: 96 mEq/L (ref 96–112)
GFR: 77.93 mL/min (ref >60.00)
GLUCOSE: 415 mg/dL — AB (ref 70–99)
Potassium: 5 mEq/L (ref 3.5–5.1)
Sodium: 135 mEq/L (ref 135–145)

## 2014-07-18 LAB — MICROALBUMIN / CREATININE URINE RATIO
CREATININE, U: 29.7 mg/dL
MICROALB UR: 67.4 mg/dL — AB (ref 0.0–1.9)
MICROALB/CREAT RATIO: 226.9 mg/g — AB (ref 0.0–30.0)

## 2014-07-18 LAB — TESTOSTERONE: TESTOSTERONE: 143.47 ng/dL — AB (ref 300.00–890.00)

## 2014-07-18 MED ORDER — TESTOSTERONE CYPIONATE 100 MG/ML IM SOLN
200.0000 mg | Freq: Once | INTRAMUSCULAR | Status: AC
  Administered 2014-07-18: 200 mg via INTRAMUSCULAR

## 2014-07-26 MED ORDER — TESTOSTERONE CYPIONATE 200 MG/ML IM SOLN: 400.0000 mg | INTRAMUSCULAR | Status: DC

## 2014-07-26 MED ORDER — NEEDLES & SYRINGES MISC: Status: DC

## 2014-09-11 ENCOUNTER — Encounter: Payer: Self-pay | Admitting: Family Medicine

## 2014-09-11 ENCOUNTER — Ambulatory Visit (INDEPENDENT_AMBULATORY_CARE_PROVIDER_SITE_OTHER): Payer: Self-pay | Admitting: Family Medicine

## 2014-09-11 VITALS — BP 149/81 | HR 70 | Temp 98.6°F | Ht 70.0 in | Wt 244.0 lb

## 2014-09-11 DIAGNOSIS — E119 Type 2 diabetes mellitus without complications: Secondary | ICD-10-CM

## 2014-09-11 MED ORDER — INSULIN DETEMIR 100 UNIT/ML FLEXPEN: PEN_INJECTOR | SUBCUTANEOUS | Status: DC

## 2014-09-11 NOTE — Progress Notes (Signed)
Pre visit review using our clinic review tool, if applicable. No additional management support is needed unless otherwise documented below in the visit note. 

## 2014-09-11 NOTE — Addendum Note (Signed)
Addended by: Colleen Can on: 09/11/2014 04:52 PM   Modules accepted: Orders

## 2014-09-11 NOTE — Progress Notes (Signed)
   Subjective:    Patient ID: Alex Ryan, male    DOB: [birth year], 44 y.o.   MRN: [id number]  HPI Here asking for advice about his diabetes. He had been seeing Dr. Chalmers Cater but he can no longer afford to see her. He has ben using Humalog 50-20-50 units daily but he ran out of Levemir months ago. He cannot afford Invokamet either. His A1c last month was 11.6.    Review of Systems  Constitutional: Negative.   Respiratory: Negative.   Cardiovascular: Negative.        Objective:   Physical Exam  Constitutional: He appears well-developed and well-nourished.  Cardiovascular: Normal rate, regular rhythm, normal heart sounds and intact distal pulses.   Pulmonary/Chest: Effort normal and breath sounds normal.          Assessment & Plan:  We will refer him to [place]. Given samples of Levemir.

## 2014-09-26 ENCOUNTER — Encounter: Payer: Self-pay | Admitting: Family Medicine

## 2014-09-26 ENCOUNTER — Ambulatory Visit (INDEPENDENT_AMBULATORY_CARE_PROVIDER_SITE_OTHER): Payer: Self-pay | Admitting: Family Medicine

## 2014-09-26 VITALS — BP 128/59 | HR 80 | Temp 100.3°F | Ht 70.0 in | Wt 249.0 lb

## 2014-09-26 DIAGNOSIS — B349 Viral infection, unspecified: Secondary | ICD-10-CM

## 2014-09-26 NOTE — Progress Notes (Signed)
   Subjective:    Patient ID: Alex Ryan, male    DOB: January 24, 1971, 44 y.o.   MRN: [id number]  HPI Here for the onset this am of fever, body aches, HA, a dry cough, and some vomiting. Able to drink fluids.    Review of Systems  Constitutional: Positive for fever.  HENT: Negative.   Eyes: Negative.   Respiratory: Positive for cough.   Cardiovascular: Negative.   Gastrointestinal: Positive for nausea and vomiting. Negative for abdominal pain, diarrhea, constipation and abdominal distention.       Objective:   Physical Exam  Constitutional: He appears well-developed and well-nourished.  HENT:  Right Ear: External ear normal.  Left Ear: External ear normal.  Nose: Nose normal.  Mouth/Throat: Oropharynx is clear and moist.  Eyes: Conjunctivae are normal.  Pulmonary/Chest: Effort normal and breath sounds normal.  Abdominal: Soft. Bowel sounds are normal. He exhibits no distension and no mass. There is no tenderness. There is no rebound and no guarding.  Lymphadenopathy:    He has no cervical adenopathy.          Assessment & Plan:  Viral illness. Rest, drink fluids. Use Ibuprofen for fever or body aches. Written out of work for 09-27-14 and 09-28-14.

## 2014-09-26 NOTE — Progress Notes (Signed)
Pre visit review using our clinic review tool, if applicable. No additional management support is needed unless otherwise documented below in the visit note. 

## 2014-10-03 ENCOUNTER — Ambulatory Visit: Payer: Self-pay | Admitting: Endocrinology

## 2014-10-10 ENCOUNTER — Telehealth: Payer: Self-pay | Admitting: Family Medicine

## 2014-10-10 NOTE — Telephone Encounter (Signed)
Pt call and would like a call back . He said he has a questions

## 2014-10-10 NOTE — Telephone Encounter (Signed)
I spoke with pt and he is having some swelling right now. Dr. Sarajane Jews recommended that pt take Lasix 40 mg twice a day until swelling goes down, if he see's no improvement once starting this then he should schedule office visit to discuss. I left a detailed phone message with this information at [phone].

## 2014-10-12 ENCOUNTER — Encounter: Payer: Self-pay | Admitting: Endocrinology

## 2014-10-12 ENCOUNTER — Other Ambulatory Visit: Payer: Self-pay | Admitting: *Deleted

## 2014-10-12 ENCOUNTER — Ambulatory Visit (INDEPENDENT_AMBULATORY_CARE_PROVIDER_SITE_OTHER): Payer: Self-pay | Admitting: Endocrinology

## 2014-10-12 VITALS — BP 132/82 | HR 69 | Temp 98.0°F | Resp 16 | Ht 70.0 in | Wt 257.2 lb

## 2014-10-12 DIAGNOSIS — IMO0002 Reserved for concepts with insufficient information to code with codable children: Secondary | ICD-10-CM

## 2014-10-12 DIAGNOSIS — I10 Essential (primary) hypertension: Secondary | ICD-10-CM

## 2014-10-12 DIAGNOSIS — E7800 Pure hypercholesterolemia, unspecified: Secondary | ICD-10-CM

## 2014-10-12 DIAGNOSIS — E1165 Type 2 diabetes mellitus with hyperglycemia: Secondary | ICD-10-CM

## 2014-10-12 DIAGNOSIS — E1142 Type 2 diabetes mellitus with diabetic polyneuropathy: Secondary | ICD-10-CM

## 2014-10-12 DIAGNOSIS — G629 Polyneuropathy, unspecified: Secondary | ICD-10-CM

## 2014-10-12 MED ORDER — INSULIN GLARGINE 300 UNIT/ML ~~LOC~~ SOPN: 60.0000 [IU] | PEN_INJECTOR | Freq: Two times a day (BID) | SUBCUTANEOUS | Status: DC

## 2014-10-12 MED ORDER — PRAVASTATIN SODIUM 20 MG PO TABS: 20.0000 mg | ORAL_TABLET | Freq: Every day | ORAL | Status: DC

## 2014-10-12 MED ORDER — INSULIN LISPRO 100 UNIT/ML ~~LOC~~ SOLN: SUBCUTANEOUS | Status: DC

## 2014-10-12 MED ORDER — CANAGLIFLOZIN-METFORMIN HCL 50-1000 MG PO TABS: ORAL_TABLET | ORAL | Status: DC

## 2014-10-12 NOTE — Patient Instructions (Addendum)
Check blood sugar before breakfast and at least one other time before the other meals or bedtime Keep a blood sugar log  TOUJEO insulin: Start taking 60 units twice a day and every 4-5 days go up 5 units until blood sugars are below 150 in the morning  HUMALOG insulin: Take 45 units at breakfast and supper and at least 20 units at lunch.  Blood sugars before lunch, dinner and bedtime should be at least under 200  Invokamet 50/1000: Start taking this twice a day with breakfast and supper  For high cholesterol: Start pravastatin 20 mg daily  May need to reduce Lasix to 40 mg when swelling comes down

## 2014-10-12 NOTE — Progress Notes (Signed)
Patient ID: Alex Ryan, male   DOB: 01-08-1971, 44 y.o.   MRN: [id number]           Reason for Appointment: Consultation for Type 2 Diabetes  Referring physician: Sarajane Jews  History of Present Illness:          Date of diagnosis of type 2 diabetes mellitus: 1996       Background history:  He had been on oral hypoglycemic drugs for several years with variable control, prior history is not available in the record Because of poor control in 2012 he was tried on Byetta in addition to his metformin and Amaryl Apparently this was not effective and he was started on insulin in 2013 with Lantus and Humalog, his A1c then was 8.3 He was also ordered an insulin pump by his other endocrinologist about 2 years ago but he could not do this because of visual difficulties He believes that when he was able to take his insulin consistently has blood sugars were usually not above 150 and also he would get some hypoglycemia also  Recent history:  He has a difficulty affording his insulin in the last couple of years and has not taken the full doses Also because of not getting Lantus with high out-of-pocket expense he was given samples of Levemir by his PCP Previously he would take 75 units of Lantus or Levemir twice a day More recently has been taking only 40-45 units to conserve his insulin He had also been given Invokana/metformin in 2015 which he has not taken recently because of cost.  He thinks this was helping his blood sugar control Blood sugars have been poorly controlled for about a year at least  More recently because of taking less insulin he has had symptoms of high blood sugars including marked fatigue, frequent urination, increased thirst. Also he has not taken his Humalog consistently and taking it only at breakfast  INSULIN regimen is described as: 40-45 Levemir bid Humalog 45 only at breakfast  Current blood sugar patterns and problems identified:  Highest usually before supper   Overall  average blood sugar 367 for the last 30 days  Fasting readings are usually 250-300     Not taking enough insulin overall and not taking any extra Humalog when his blood sugars are the highest  Oral hypoglycemic drugs the patient is taking are:   none     Side effects from medications have been: None  Compliance with the medical regimen: Poor Hypoglycemia:   none for some time  Glucose monitoring:  done  times a day         Glucometer: True touch        Blood Glucose readings as above  Self-care: The diet that the patient has been following is: tries to limit fatty foods      Dietician visit, most recent: Years ago               Exercise: none  Weight history:  Wt Readings from Last 3 Encounters:  10/12/14 257 lb 3.2 oz (116.665 kg)  09/26/14 249 lb (112.946 kg)  09/11/14 244 lb (110.678 kg)    Glycemic control:    Lab Results  Component Value Date   HGBA1C 11.6* 07/18/2014   HGBA1C 11.3* 12/11/2013   HGBA1C 8.3* 06/22/2011   Lab Results  Component Value Date   MICROALBUR 67.4* 07/18/2014   LDLCALC 123* 09/21/2013   CREATININE 1.09 07/18/2014         Medication List  This list is accurate as of: 10/12/14 12:58 PM.  Always use your most recent med list.               albuterol 108 (90 BASE) MCG/ACT inhaler  Commonly known as:  PROVENTIL HFA;VENTOLIN HFA  Inhale 2 puffs into the lungs every 4 (four) hours as needed for wheezing or shortness of breath. PRN:  Allergies/breathing     Alcohol Swabs Pads  Use up to 10 times per day for testing and insulin injections, diagnosis code is 250.00     amLODipine 5 MG tablet  Commonly known as:  NORVASC  TAKE 1 TABLET BY MOUTH DAILY.     amoxicillin-clavulanate 875-125 MG per tablet  Commonly known as:  AUGMENTIN  Take 1 tablet by mouth 2 (two) times daily.     aspirin 325 MG EC tablet  Take 81 mg by mouth daily.     augmented betamethasone dipropionate 0.05 % cream  Commonly known as:  DIPROLENE-AF    Apply topically 2 (two) times daily.     benzonatate 200 MG capsule  Commonly known as:  TESSALON  Take 1 capsule (200 mg total) by mouth 2 (two) times daily as needed for cough.     cephALEXin 500 MG capsule  Commonly known as:  KEFLEX  Take 1 capsule (500 mg total) by mouth 4 (four) times daily.     cetirizine 10 MG tablet  Commonly known as:  ZYRTEC  Take 1 tablet (10 mg total) by mouth daily.     diazepam 5 MG tablet  Commonly known as:  VALIUM  Take 1 tablet (5 mg total) by mouth every 12 (twelve) hours as needed for anxiety.     diphenhydrAMINE 25 mg capsule  Commonly known as:  BENADRYL  Take 25 mg by mouth daily. For allergies     fluconazole 100 MG tablet  Commonly known as:  DIFLUCAN  Take 1 tablet (100 mg total) by mouth 2 (two) times daily.     FLUoxetine 40 MG capsule  Commonly known as:  PROZAC  Take 1 capsule (40 mg total) by mouth daily.     furosemide 40 MG tablet  Commonly known as:  LASIX  TAKE 1 TABLET BY MOUTH TWICE DAILY AS NEEDED FOR FLUID RETENTION     gabapentin 300 MG capsule  Commonly known as:  NEURONTIN  Take 1 capsule (300 mg total) by mouth 3 (three) times daily.     HYDROcodone-homatropine 5-1.5 MG/5ML syrup  Commonly known as:  HYDROMET  Take 5 mLs by mouth every 4 (four) hours as needed for cough.     Insulin Detemir 100 UNIT/ML Pen  Commonly known as:  LEVEMIR FLEXPEN  Tale 75 units twice daily.     insulin lispro 100 UNIT/ML injection  Commonly known as:  HUMALOG  45 units before breakfast, 10 units before lunch, 45 units at bedtime with snack     INVOKAMET [phone] MG Tabs  Generic drug:  Canagliflozin-Metformin HCl  Take 1 tablet by mouth 2 (two) times daily.     losartan 50 MG tablet  Commonly known as:  COZAAR  TAKE 1 TABLET BY MOUTH 2 TIMES DAILY.     metoprolol succinate 100 MG 24 hr tablet  Commonly known as:  TOPROL-XL  TAKE 1 TABLET BY MOUTH DAILY.     Needles & Syringes Misc  Inject 2 ml intramuscular every  28 days.     ondansetron 4 MG tablet  Commonly known as:  ZOFRAN  Take 1 tablet (4 mg total) by mouth every 8 (eight) hours as needed for nausea or vomiting.     testosterone cypionate 200 MG/ML injection  Commonly known as:  DEPOTESTOTERONE CYPIONATE  Inject 2 mLs (400 mg total) into the muscle every 28 (twenty-eight) days.        Allergies:  Allergies  Allergen Reactions  . Codeine Hives    Hyper  . Erythromycin Nausea And Vomiting  . Other     Pre-op cleaning wipes:. itching  . Soap Itching    "dial soap"    Past Medical History  Diagnosis Date  . Hypertension   . Anxiety   . Shingles 2010  . Allergic rhinitis   . Diabetes mellitus     sees Dr. Jacelyn Pi   . Retinopathy of both eyes     sees Dr. Zadie Rhine   . Neuropathy in diabetes   . Cellulitis     legs - hx  . OSA (obstructive sleep apnea)     CPAP- not wearing  . Recurrent upper respiratory infection (URI)     frequent bronchcitis  . H/O hiatal hernia     pt thinks so  . Headache(784.0)     had migraine- not in years  . Retinopathy     right eye  . Asthma   . Chronic kidney disease   . Bladder infection     in June -Was on Cipro  . Heart murmur     Followed by Dr Stanford Breed  . Hyperlipidemia     Past Surgical History  Procedure Laterality Date  . Tonsillectomy  1983  . Eye surgery  2012    Cataract with lens  Right  . Pars plana vitrectomy w/ scleral buckle  09/01/2011    Procedure: PARS PLANA VITRECTOMY WITH LASER FOR MACULAR HOLE;  Surgeon: Hurman Horn, MD;  Location: [facility];  Service: Ophthalmology;  Laterality: Right;  Pars Plana Vitrectomy with Laser and Membrane Peel; Insertion Silicone Oil  . Pars plana vitrectomy  12/04/2011    Procedure: PARS PLANA VITRECTOMY WITH 25 GAUGE;  Surgeon: Hurman Horn, MD;  Location: [facility];  Service: Ophthalmology;  Laterality: Left;  Insertion Silicon Oil 1191 CS and    . Vitrectomy  2013    per Dr. Zadie Rhine    Family History  Problem Relation Age of  Onset  . Diabetes Mother   . Transient ischemic attack Father   . Cancer Father   . Anesthesia problems Neg Hx   . Diabetes Maternal Uncle   . Diabetes Maternal Grandmother   . Heart disease Maternal Grandfather     Social History:  reports that he quit smoking about 5 years ago. He has quit using smokeless tobacco. He reports that he does not drink alcohol or use illicit drugs.    Review of Systems    Lipid history: Not on medications, has had high LDL levels    Lab Results  Component Value Date   CHOL 197 09/21/2013   HDL 48.30 09/21/2013   LDLCALC 123* 09/21/2013   TRIG 131.0 09/21/2013   CHOLHDL 4 09/21/2013           Constitutional: no recent weight gain/loss, he does have thickened fatigue   Eyes:  history of blurred vision.  Has markedly decreased vision on the left side and moderately on the right from retinopathy Most recent eye exam was 6/15, [facility]  ENT: no nasal congestion, difficulty swallowing, no hoarseness   Cardiovascular:  no chest pain or tightness on exertion.  No leg swelling.  Hypertension:  Respiratory: no cough/shortness of breath  He has had sleep apnea and does have CPAP equipment at home but not using it as the the hose is broken  Gastrointestinal: no constipation, diarrhea, nausea or abdominal pain  Musculoskeletal: no muscle/joint aches   Urological:   No frequency of urination or  nocturia  Skin: no rash or infections  Neurological: no headaches.  Has numbness, burning, and some tingling in feet for several years Also has mild allergies difficulties   Psychiatric: no symptoms of depression  Endocrine: No cold intolerance or history of thyroid disease He has had hypogonadism but not treated because of cost   LABS:  No visits with results within 1 Week(s) from this visit. Latest known visit with results is:  Lab on 07/18/2014  Component Date Value Ref Range Status  . Testosterone 07/18/2014 143.47* 300.00 - 890.00 ng/dL Final   . Hgb A1c MFr Bld 07/18/2014 11.6* 4.6 - 6.5 % Final   Glycemic Control Guidelines for People with Diabetes:Non Diabetic:  <6%Goal of Therapy: <7%Additional Action Suggested:  >8%   . Sodium 07/18/2014 135  135 - 145 mEq/L Final  . Potassium 07/18/2014 5.0  3.5 - 5.1 mEq/L Final  . Chloride 07/18/2014 96  96 - 112 mEq/L Final  . CO2 07/18/2014 32  19 - 32 mEq/L Final  . Glucose, Bld 07/18/2014 415* 70 - 99 mg/dL Final  . BUN 07/18/2014 18  6 - 23 mg/dL Final  . Creatinine, Ser 07/18/2014 1.09  0.40 - 1.50 mg/dL Final  . Calcium 07/18/2014 9.9  8.4 - 10.5 mg/dL Final  . GFR 07/18/2014 77.93  >60.00 mL/min Final  . Microalb, Ur 07/18/2014 67.4* 0.0 - 1.9 mg/dL Final  . Creatinine,U 07/18/2014 29.7   Final  . Microalb Creat Ratio 07/18/2014 226.9* 0.0 - 30.0 mg/g Final    Physical Examination:  BP 132/82 mmHg  Pulse 69  Temp(Src) 98 F (36.7 C)  Resp 16  Ht 5\' 10"  (1.778 m)  Wt 257 lb 3.2 oz (116.665 kg)  BMI 36.90 kg/m2  SpO2 97%  GENERAL:         Patient has mild generalized obesity.   HEENT:         Eye exam shows normal external appearance. Fundus exam on the right shows multiple laser scars, unable to visualize left fundus Oral exam shows normal mucosa .  NECK:   There is no lymphadenopathy Thyroid is not enlarged and no nodules felt.  Carotids are normal to palpation and no bruit heard LUNGS:         Chest is symmetrical. Lungs are clear to auscultation.Marland Kitchen   HEART:         Heart sounds:  S1 and S2 are normal. No murmurs or clicks heard., no S3 or S4.   ABDOMEN:   There is no distention present. Liver and spleen are not palpable. No other mass or tenderness present.   NEUROLOGICAL:   Vibration sense is absent on the left and markedly reduced in distal right toes. Ankle jerks are absent bilaterally. biceps reflexes are normal.            Absent distal monofilament sensation in the feet although some sensation is present on the plantar surfaces in the middle of the distal  feet Pedal pulses are normal MUSCULOSKELETAL:  There is no swelling or deformity of the peripheral joints. Spine is normal to inspection.   EXTREMITIES:  There is 2-3+ lower leg edema. No skin lesions present.  Has stasis pigmentation of a dusky color on the lower legs SKIN:       No rash or lesions of concern.        ASSESSMENT:  Diabetes type 2, uncontrolled    He has had poor control for quite some time and is fairly insulin deficient His blood sugars are primarily poorly controlled because of financial difficulties and not taking enough insulin, both basal and bolus He is also insulin resistant because of his significantly high insulin requirements Currently not taking any insulin sensitizers like metformin Also reportedly had benefited from [facility] which he has not been able to afford  Complications: Severe and neuropathy with sensory loss, microalbuminuria and possible nephrotic syndrome, proliferative retinopathy with visual loss  Edema likely to be secondary to nephrotic syndrome  Hypertension: Appears well controlled  History of 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  Discussed that he can adjust the Toujeo every 3-4 days based on his fasting blood sugars and gradually bring his sugars down to about 150.  He does need to go up on his Humalog to  his previous doses of 45 units at breakfast and supper and at least 20 units at lunch  Meanwhile he will apply 4 patient assistance with the pharmaceutical company to get [place] and Novolog as well as pen needles  Discussed that blood sugars after meals should not be over 200  Consider consultation with dietitian  He was given the free co-pay card to start Invokana/metformin 50/1000 twice a day.  This should also help his blood sugar control as well as his tendency to edema  He will start monitoring his blood pressure regularly and stop amlodipine if blood pressure low normal  Start pravastatin 20 mg daily, this may be available on the $4 co-pay plan.  Discussed need for cardiovascular risk reduction  Regular eye exams  Follow-up in 4 weeks  Patient Instructions  Check blood sugar before breakfast and at least one other time before the other meals or bedtime Keep a blood sugar log  TOUJEO insulin: Start taking 60 units twice a day and every 4-5 days go up 5 units until blood sugars are below 150 in the morning  HUMALOG insulin: Take 45 units at breakfast and supper and at least 20 units at lunch.  Blood sugars before lunch, dinner and bedtime should be at least under 200  Invokamet 50/1000: Start taking this twice a day with breakfast and supper  For high cholesterol: Start pravastatin 20 mg daily  May need to reduce Lasix to 40 mg when swelling comes down    [facility] 10/12/2014, 12:58 PM   Note: This office note was prepared with Estate agent. Any transcriptional errors that result from this process are unintentional.

## 2014-11-05 ENCOUNTER — Other Ambulatory Visit (INDEPENDENT_AMBULATORY_CARE_PROVIDER_SITE_OTHER): Payer: Self-pay

## 2014-11-05 DIAGNOSIS — E1165 Type 2 diabetes mellitus with hyperglycemia: Secondary | ICD-10-CM

## 2014-11-05 DIAGNOSIS — E7800 Pure hypercholesterolemia, unspecified: Secondary | ICD-10-CM

## 2014-11-05 DIAGNOSIS — IMO0002 Reserved for concepts with insufficient information to code with codable children: Secondary | ICD-10-CM

## 2014-11-05 LAB — BASIC METABOLIC PANEL
BUN: 14 mg/dL (ref 6–23)
CHLORIDE: 101 meq/L (ref 96–112)
CO2: 32 mEq/L (ref 19–32)
Calcium: 9.3 mg/dL (ref 8.4–10.5)
Creatinine, Ser: 0.84 mg/dL (ref 0.40–1.50)
GFR: 105.11 mL/min (ref >60.00)
Glucose, Bld: 300 mg/dL — ABNORMAL HIGH (ref 70–99)
POTASSIUM: 4.5 meq/L (ref 3.5–5.1)
Sodium: 138 mEq/L (ref 135–145)

## 2014-11-05 LAB — LDL CHOLESTEROL, DIRECT: Direct LDL: 103 mg/dL

## 2014-11-08 ENCOUNTER — Encounter: Payer: Self-pay | Admitting: Endocrinology

## 2014-11-08 ENCOUNTER — Ambulatory Visit (INDEPENDENT_AMBULATORY_CARE_PROVIDER_SITE_OTHER): Payer: Self-pay | Admitting: Endocrinology

## 2014-11-08 ENCOUNTER — Other Ambulatory Visit: Payer: Self-pay | Admitting: *Deleted

## 2014-11-08 VITALS — BP 136/72 | HR 73 | Temp 98.0°F | Resp 16 | Ht 70.0 in | Wt 245.6 lb

## 2014-11-08 DIAGNOSIS — E1165 Type 2 diabetes mellitus with hyperglycemia: Secondary | ICD-10-CM

## 2014-11-08 DIAGNOSIS — IMO0002 Reserved for concepts with insufficient information to code with codable children: Secondary | ICD-10-CM

## 2014-11-08 DIAGNOSIS — E7800 Pure hypercholesterolemia, unspecified: Secondary | ICD-10-CM

## 2014-11-08 MED ORDER — CANAGLIFLOZIN-METFORMIN HCL 150-1000 MG PO TABS: ORAL_TABLET | ORAL | Status: DC

## 2014-11-08 NOTE — Progress Notes (Signed)
Patient ID: Alex Ryan, male   DOB: Jun 10, 1970, 44 y.o.   MRN: [id number]           Reason for Appointment: Follow-up for Type 2 Diabetes  Referring physician: Clent Ridges  History of Present Illness:          Date of diagnosis of type 2 diabetes mellitus: 1996       Background history:  He had been on oral hypoglycemic drugs for several years with variable control, prior history is not available in the record Because of poor control in 2012 he was tried on Byetta in addition to his metformin and Amaryl Apparently this was not effective and he was started on insulin in 2013 with Lantus and Humalog, his A1c then was 8.3 He was also ordered an insulin pump by his other endocrinologist about 2 years ago but he could not do this because of visual difficulties He believes that when he was able to take his insulin consistently has blood sugars were usually not above 150 and also he would get some hypoglycemia also  Recent history:   INSULIN regimen is described as: Toujeo 60 bid    Humalog 45 20-45  He was seen in consultation for poorly controlled diabetes and A1c of over 11% twice He had been having difficulty with affording his medications including insulin He was given Toujeo insulin with the co-pay card and he has been able to get this and is taking this regularly twice a day Also his Humalog dose was increased and he was advised to take it 3 times a day before meals He has also been able to start back on Invokamet 50/1000 twice a day  Current blood sugar patterns and problems identified:  He did not bring his blood sugar record and not clear what his blood sugars are at various times  Although his blood sugars are still overall high ER improving from his previous average blood sugar of about 365  He has not changed his insulin doses despite his blood sugars being high and being instructed on increasing the mealtime dose to keep postprandial under 200.  He has no side effects from  Invokamet and appears to have lost weight with starting Invokana  Also has done some better with his exercise regimen and is walking more regularly since his was advised to do so.  His blood sugars are usually much better in the afternoon even though he is taking only 20 units of Humalog with lunch but his usually trying to eat a salad without carbohydrates at that time  Oral hypoglycemic drugs the patient is taking are:   Invokamet twice a day     Side effects from medications have been: None  Compliance with the medical regimen: Poor Hypoglycemia:   none for some time  Glucose monitoring:  done 2 times a day         Glucometer: True Touch        Blood Glucose readings from recall:  PRE-MEAL Fasting Lunch Dinner Bedtime Overall  Glucose range: 245, 198   about 150    Mean/median:        POST-MEAL PC Breakfast PC Lunch PC Dinner  Glucose range:   80 215-303  Mean/median:      Self-care: The diet that the patient has been following is: tries to limit fatty foods      Dietician visit, most recent: Years ago               Exercise:  mall upto 60 min  Weight history:  Wt Readings from Last 3 Encounters:  11/08/14 245 lb 9.6 oz (111.403 kg)  10/12/14 257 lb 3.2 oz (116.665 kg)  09/26/14 249 lb (112.946 kg)    Glycemic control:    Lab Results  Component Value Date   HGBA1C 11.6* 07/18/2014   HGBA1C 11.3* 12/11/2013   HGBA1C 8.3* 06/22/2011   Lab Results  Component Value Date   MICROALBUR 67.4* 07/18/2014   LDLCALC 123* 09/21/2013   CREATININE 0.84 11/05/2014         Medication List       This list is accurate as of: 11/08/14  2:17 PM.  Always use your most recent med list.               albuterol 108 (90 BASE) MCG/ACT inhaler  Commonly known as:  PROVENTIL HFA;VENTOLIN HFA  Inhale 2 puffs into the lungs every 4 (four) hours as needed for wheezing or shortness of breath. PRN:  Allergies/breathing     Alcohol Swabs Pads  Use up to 10 times per day for  testing and insulin injections, diagnosis code is 250.00     amLODipine 5 MG tablet  Commonly known as:  NORVASC  TAKE 1 TABLET BY MOUTH DAILY.     aspirin 325 MG EC tablet  Take 81 mg by mouth daily.     Canagliflozin-Metformin HCl [phone] MG Tabs  Commonly known as:  INVOKAMET  Take 1 tablet twice a day     cetirizine 10 MG tablet  Commonly known as:  ZYRTEC  Take 1 tablet (10 mg total) by mouth daily.     diazepam 5 MG tablet  Commonly known as:  VALIUM  Take 1 tablet (5 mg total) by mouth every 12 (twelve) hours as needed for anxiety.     fluconazole 100 MG tablet  Commonly known as:  DIFLUCAN  Take 1 tablet (100 mg total) by mouth 2 (two) times daily.     FLUoxetine 40 MG capsule  Commonly known as:  PROZAC  Take 1 capsule (40 mg total) by mouth daily.     furosemide 40 MG tablet  Commonly known as:  LASIX  TAKE 1 TABLET BY MOUTH TWICE DAILY AS NEEDED FOR FLUID RETENTION     gabapentin 300 MG capsule  Commonly known as:  NEURONTIN  Take 1 capsule (300 mg total) by mouth 3 (three) times daily.     Insulin Glargine 300 UNIT/ML Sopn  Commonly known as:  TOUJEO SOLOSTAR  Inject 60 Units into the skin 2 (two) times daily.     insulin lispro 100 UNIT/ML injection  Commonly known as:  HUMALOG  Take 45 units at breakfast and supper and at least 20 units at lunch.  Blood sugars before lunch, dinner and bedtime should be at least under 200     losartan 50 MG tablet  Commonly known as:  COZAAR  TAKE 1 TABLET BY MOUTH 2 TIMES DAILY.     metoprolol succinate 100 MG 24 hr tablet  Commonly known as:  TOPROL-XL  TAKE 1 TABLET BY MOUTH DAILY.     Needles & Syringes Misc  Inject 2 ml intramuscular every 28 days.     ondansetron 4 MG tablet  Commonly known as:  ZOFRAN  Take 1 tablet (4 mg total) by mouth every 8 (eight) hours as needed for nausea or vomiting.     pravastatin 20 MG tablet  Commonly known as:  PRAVACHOL  Take 1  tablet (20 mg total) by mouth daily.      testosterone cypionate 200 MG/ML injection  Commonly known as:  DEPOTESTOSTERONE CYPIONATE  Inject 2 mLs (400 mg total) into the muscle every 28 (twenty-eight) days.        Allergies:  Allergies  Allergen Reactions  . Codeine Hives    Hyper  . Erythromycin Nausea And Vomiting  . Other     Pre-op cleaning wipes:. itching  . Soap Itching    "dial soap"    Past Medical History  Diagnosis Date  . Hypertension   . Anxiety   . Shingles 2010  . Allergic rhinitis   . Diabetes mellitus     sees Dr. Dorisann Frames   . Retinopathy of both eyes     sees Dr. Luciana Axe   . Neuropathy in diabetes   . Cellulitis     legs - hx  . OSA (obstructive sleep apnea)     CPAP- not wearing  . Recurrent upper respiratory infection (URI)     frequent bronchcitis  . H/O hiatal hernia     pt thinks so  . Headache(784.0)     had migraine- not in years  . Retinopathy     right eye  . Asthma   . Chronic kidney disease   . Bladder infection     in June -Was on Cipro  . Heart murmur     Followed by Dr Jens Som  . Hyperlipidemia     Past Surgical History  Procedure Laterality Date  . Tonsillectomy  1983  . Eye surgery  2012    Cataract with lens  Right  . 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 Pars plana vitrectomy  12/04/2011    Procedure: PARS PLANA VITRECTOMY WITH 25 GAUGE;  Surgeon: Edmon Crape, MD;  Location: [facility] OR;  Service: Ophthalmology;  Laterality: Left;  Insertion Silicon Oil 5000 CS and    . Vitrectomy  2013    per Dr. Luciana Axe    Family History  Problem Relation Age of Onset  . Diabetes Mother   . Transient ischemic attack Father   . Cancer Father   . Anesthesia problems Neg Hx   . Diabetes Maternal Uncle   . Diabetes Maternal  Grandmother   . Heart disease Maternal Grandfather     Social History:  reports that he quit smoking about 5 years ago. He has quit using smokeless tobacco. He reports that he does not drink alcohol or use illicit drugs.    Review of Systems    Lipid history: Has been started on pravastatin 20 mg which he can afford, LDL is nearly normal    Lab Results  Component Value Date   CHOL 197 09/21/2013   HDL 48.30 09/21/2013   LDLCALC 123* 09/21/2013   LDLDIRECT 103.0 11/05/2014   TRIG 131.0 09/21/2013   CHOLHDL 4 09/21/2013           Most recent eye exam was 6/15, [place]  Diabetic foot exam in 5/16 showed sensory loss  He has had hypogonadism but not treated because of cost   LABS:  Lab on 11/05/2014  Component Date Value Ref Range Status  . Direct LDL 11/05/2014 103.0   Final   Optimal:  <  100 mg/dLNear or Above Optimal:  100-129 mg/dLBorderline High:  130-159 mg/dLHigh:  160-189 mg/dLVery High:  >190 mg/dL  . Sodium 11/05/2014 138  135 - 145 mEq/L Final  . Potassium 11/05/2014 4.5  3.5 - 5.1 mEq/L Final  . Chloride 11/05/2014 101  96 - 112 mEq/L Final  . CO2 11/05/2014 32  19 - 32 mEq/L Final  . Glucose, Bld 11/05/2014 300* 70 - 99 mg/dL Final  . BUN 16/02/9603 14  6 - 23 mg/dL Final  . Creatinine, Ser 11/05/2014 0.84  0.40 - 1.50 mg/dL Final  . Calcium 54/01/8118 9.3  8.4 - 10.5 mg/dL Final  . GFR 14/78/2956 105.11  >60.00 mL/min Final    Physical Examination:  BP 136/72 mmHg  Pulse 73  Temp(Src) 98 F (36.7 C)  Resp 16  Ht 5\' 10"  (1.778 m)  Wt 245 lb 9.6 oz (111.403 kg)  BMI 35.24 kg/m2  SpO2 93%          ASSESSMENT:  Diabetes type 2, uncontrolled    He has had poor control and is insulin resistant, blood sugars are not improved adequately with increasing his insulin doses See history of present illness for detailed discussion of his current management, blood sugar patterns and problems identified His compliance with insulin has been improving especially  with his being able to get his prescriptions without excessive financial burdens recently Unable to review his blood sugars from home but they appear to be mostly high, around 200+ at all times except around midafternoon Most likely he needs higher doses of basal insulin as well as mealtime coverage at breakfast and lunch from his current reported blood sugars  PLAN:   Bring blood sugar diary on each visit  Increase Toujeo to 65 in the morning and 80 in the evening  To start using a flow sheet for adjusting the evening Toujeo dose to get morning sugars down to at least under 150.  He will adjust the dose by 5 units every 3 days   Go up on the morning Humalog at least 5 units and by 10 units in the evening; may go up further on a weekly basis if post prandial readings are over 200  Increase dose of Invokana to 300 mg and he can continue the combination with metformin, new prescription sent  Continue pravastatin 20 mg daily  Regular eye exams  Follow-up in 6 weeks with A1c  Patient Instructions  INSULIN regimen: Toujeo 65 am and 80 in pm     Humalog 50 in am  55 at supper  Check blood sugars on waking up daily Also check blood sugars about 2 hours after a meal and do this after different meals by rotation Recommended blood sugar levels on waking up is 90-150 and about 2 hours after meal is 140-180 Please bring blood sugar monitor to each visit.   Counseling time on subjects discussed above is over 50% of today's 25 minute visit   Alex Ryan 11/08/2014, 2:17 PM   Note: This office note was prepared with Dragon voice recognition system technology. Any transcriptional errors that result from this process are unintentional.

## 2014-11-08 NOTE — Patient Instructions (Addendum)
INSULIN regimen: Toujeo 65 am and 80 in pm     Humalog 50 in am  55 at supper  Check blood sugars on waking up daily Also check blood sugars about 2 hours after a meal and do this after different meals by rotation Recommended blood sugar levels on waking up is 90-150 and about 2 hours after meal is 140-180 Please bring blood sugar monitor to each visit.

## 2014-12-18 ENCOUNTER — Other Ambulatory Visit: Payer: Self-pay

## 2014-12-21 ENCOUNTER — Ambulatory Visit: Payer: Self-pay | Admitting: Endocrinology

## 2015-01-03 ENCOUNTER — Other Ambulatory Visit: Payer: Self-pay | Admitting: Family Medicine

## 2015-01-15 ENCOUNTER — Telehealth: Payer: Self-pay | Admitting: Family Medicine

## 2015-01-15 MED ORDER — CEPHALEXIN 500 MG PO CAPS: 500.0000 mg | ORAL_CAPSULE | Freq: Four times a day (QID) | ORAL | Status: DC

## 2015-01-15 NOTE — Telephone Encounter (Signed)
Call in Keflex 500 mg QID #[id number]

## 2015-01-15 NOTE — Telephone Encounter (Signed)
Pt left a voice message requesting a refill on keflex, he has cellulitis again in left leg. Can we call in script ?

## 2015-01-15 NOTE — Telephone Encounter (Signed)
I sent script e-scribe to [facility] outpatient and spoke with pt.

## 2015-02-13 ENCOUNTER — Encounter: Payer: Self-pay | Admitting: Family Medicine

## 2015-02-13 ENCOUNTER — Ambulatory Visit (INDEPENDENT_AMBULATORY_CARE_PROVIDER_SITE_OTHER): Payer: Self-pay | Admitting: Family Medicine

## 2015-02-13 VITALS — BP 157/85 | HR 73 | Temp 98.7°F | Ht 70.0 in | Wt 256.0 lb

## 2015-02-13 DIAGNOSIS — E291 Testicular hypofunction: Secondary | ICD-10-CM

## 2015-02-13 DIAGNOSIS — Z23 Encounter for immunization: Secondary | ICD-10-CM

## 2015-02-13 MED ORDER — TESTOSTERONE 30 MG/ACT TD SOLN: 1.0000 | Freq: Every day | TRANSDERMAL | Status: DC

## 2015-02-13 NOTE — Progress Notes (Signed)
Pre visit review using our clinic review tool, if applicable. No additional management support is needed unless otherwise documented below in the visit note. 

## 2015-02-13 NOTE — Progress Notes (Signed)
   Subjective:    Patient ID: Alex Ryan, male    DOB: 10/23/1970, 44 y.o.   MRN: [id number]  HPI Here to discuss testosterone replacement. He has had very low testosterone levels for some time and we had prescribed testosterone cypionate injections. However he cannot afford these since he has no insurance. All the other forms of testosterone replacement are even more expensive. He asks for advice. He feels tired and flushed, he is losing muscle mass, and he has a terrible problem with erections.    Review of Systems  Constitutional: Positive for fatigue.  Respiratory: Negative.   Cardiovascular: Negative.   Endocrine: Positive for heat intolerance.  Neurological: Negative.        Objective:   Physical Exam  Constitutional: He is oriented to person, place, and time. He appears well-developed and well-nourished.  Neck: No thyromegaly present.  Cardiovascular: Normal rate, regular rhythm, normal heart sounds and intact distal pulses.   Pulmonary/Chest: Effort normal and breath sounds normal.  Lymphadenopathy:    He has no cervical adenopathy.  Neurological: He is alert and oriented to person, place, and time.          Assessment & Plan:  Hypogonadism. I gave him a coupon to get discounts on Axiron as well as a rx for this. He will use this daily. Recheck a level in 6  Months

## 2015-02-18 ENCOUNTER — Other Ambulatory Visit: Payer: Self-pay | Admitting: Family Medicine

## 2015-02-28 ENCOUNTER — Other Ambulatory Visit: Payer: Self-pay | Admitting: Family Medicine

## 2015-05-06 ENCOUNTER — Emergency Department (HOSPITAL_COMMUNITY): Payer: Medicaid Other

## 2015-05-06 ENCOUNTER — Other Ambulatory Visit: Payer: Self-pay

## 2015-05-06 ENCOUNTER — Emergency Department (HOSPITAL_COMMUNITY)
Admission: EM | Admit: 2015-05-06 | Discharge: 2015-05-06 | Disposition: A | Discharge status: Home / Self Care | Payer: Medicaid Other | Source: Home / Self Care | Attending: Emergency Medicine | Admitting: Emergency Medicine

## 2015-05-06 ENCOUNTER — Encounter (HOSPITAL_COMMUNITY): Payer: Self-pay

## 2015-05-06 ENCOUNTER — Encounter (HOSPITAL_COMMUNITY): Payer: Self-pay | Admitting: Emergency Medicine

## 2015-05-06 ENCOUNTER — Ambulatory Visit (EMERGENCY_DEPARTMENT_HOSPITAL)
Admission: RE | Admit: 2015-05-06 | Discharge: 2015-05-06 | Disposition: A | Discharge status: Home / Self Care | Payer: Medicaid Other | Source: Ambulatory Visit | Attending: Emergency Medicine | Admitting: Emergency Medicine

## 2015-05-06 ENCOUNTER — Inpatient Hospital Stay (HOSPITAL_COMMUNITY)
Admission: EM | Admit: 2015-05-06 | Discharge: 2015-05-09 | DRG: 872 | Disposition: A | Discharge status: Home Health | Payer: Medicaid Other | Attending: Internal Medicine | Admitting: Internal Medicine

## 2015-05-06 ENCOUNTER — Ambulatory Visit (INDEPENDENT_AMBULATORY_CARE_PROVIDER_SITE_OTHER): Payer: Self-pay | Admitting: Family Medicine

## 2015-05-06 ENCOUNTER — Encounter: Payer: Self-pay | Admitting: Family Medicine

## 2015-05-06 VITALS — BP 146/70 | HR 101 | Temp 102.6°F

## 2015-05-06 DIAGNOSIS — L03116 Cellulitis of left lower limb: Secondary | ICD-10-CM | POA: Diagnosis present

## 2015-05-06 DIAGNOSIS — Z79899 Other long term (current) drug therapy: Secondary | ICD-10-CM | POA: Insufficient documentation

## 2015-05-06 DIAGNOSIS — Z87891 Personal history of nicotine dependence: Secondary | ICD-10-CM | POA: Diagnosis not present

## 2015-05-06 DIAGNOSIS — N179 Acute kidney failure, unspecified: Secondary | ICD-10-CM

## 2015-05-06 DIAGNOSIS — G4733 Obstructive sleep apnea (adult) (pediatric): Secondary | ICD-10-CM | POA: Diagnosis present

## 2015-05-06 DIAGNOSIS — T148XXA Other injury of unspecified body region, initial encounter: Secondary | ICD-10-CM

## 2015-05-06 DIAGNOSIS — E11319 Type 2 diabetes mellitus with unspecified diabetic retinopathy without macular edema: Secondary | ICD-10-CM

## 2015-05-06 DIAGNOSIS — A419 Sepsis, unspecified organism: Principal | ICD-10-CM | POA: Diagnosis present

## 2015-05-06 DIAGNOSIS — E785 Hyperlipidemia, unspecified: Secondary | ICD-10-CM | POA: Insufficient documentation

## 2015-05-06 DIAGNOSIS — R509 Fever, unspecified: Secondary | ICD-10-CM | POA: Diagnosis not present

## 2015-05-06 DIAGNOSIS — E1165 Type 2 diabetes mellitus with hyperglycemia: Secondary | ICD-10-CM | POA: Diagnosis present

## 2015-05-06 DIAGNOSIS — Z8619 Personal history of other infectious and parasitic diseases: Secondary | ICD-10-CM

## 2015-05-06 DIAGNOSIS — F419 Anxiety disorder, unspecified: Secondary | ICD-10-CM

## 2015-05-06 DIAGNOSIS — Z9981 Dependence on supplemental oxygen: Secondary | ICD-10-CM

## 2015-05-06 DIAGNOSIS — W010XXA Fall on same level from slipping, tripping and stumbling without subsequent striking against object, initial encounter: Secondary | ICD-10-CM

## 2015-05-06 DIAGNOSIS — L97319 Non-pressure chronic ulcer of right ankle with unspecified severity: Secondary | ICD-10-CM | POA: Diagnosis present

## 2015-05-06 DIAGNOSIS — E119 Type 2 diabetes mellitus without complications: Secondary | ICD-10-CM

## 2015-05-06 DIAGNOSIS — E1029 Type 1 diabetes mellitus with other diabetic kidney complication: Secondary | ICD-10-CM

## 2015-05-06 DIAGNOSIS — N189 Chronic kidney disease, unspecified: Secondary | ICD-10-CM | POA: Insufficient documentation

## 2015-05-06 DIAGNOSIS — Y998 Other external cause status: Secondary | ICD-10-CM | POA: Insufficient documentation

## 2015-05-06 DIAGNOSIS — Z794 Long term (current) use of insulin: Secondary | ICD-10-CM | POA: Insufficient documentation

## 2015-05-06 DIAGNOSIS — R739 Hyperglycemia, unspecified: Secondary | ICD-10-CM

## 2015-05-06 DIAGNOSIS — Z8249 Family history of ischemic heart disease and other diseases of the circulatory system: Secondary | ICD-10-CM | POA: Diagnosis not present

## 2015-05-06 DIAGNOSIS — Z833 Family history of diabetes mellitus: Secondary | ICD-10-CM

## 2015-05-06 DIAGNOSIS — I10 Essential (primary) hypertension: Secondary | ICD-10-CM

## 2015-05-06 DIAGNOSIS — I878 Other specified disorders of veins: Secondary | ICD-10-CM | POA: Insufficient documentation

## 2015-05-06 DIAGNOSIS — L03115 Cellulitis of right lower limb: Secondary | ICD-10-CM | POA: Diagnosis present

## 2015-05-06 DIAGNOSIS — Y9222 Religious institution as the place of occurrence of the external cause: Secondary | ICD-10-CM

## 2015-05-06 DIAGNOSIS — E1140 Type 2 diabetes mellitus with diabetic neuropathy, unspecified: Secondary | ICD-10-CM | POA: Diagnosis present

## 2015-05-06 DIAGNOSIS — R7989 Other specified abnormal findings of blood chemistry: Secondary | ICD-10-CM

## 2015-05-06 DIAGNOSIS — E11622 Type 2 diabetes mellitus with other skin ulcer: Secondary | ICD-10-CM | POA: Diagnosis present

## 2015-05-06 DIAGNOSIS — J45909 Unspecified asthma, uncomplicated: Secondary | ICD-10-CM

## 2015-05-06 DIAGNOSIS — I129 Hypertensive chronic kidney disease with stage 1 through stage 4 chronic kidney disease, or unspecified chronic kidney disease: Secondary | ICD-10-CM | POA: Diagnosis present

## 2015-05-06 DIAGNOSIS — R011 Cardiac murmur, unspecified: Secondary | ICD-10-CM | POA: Insufficient documentation

## 2015-05-06 DIAGNOSIS — R809 Proteinuria, unspecified: Secondary | ICD-10-CM

## 2015-05-06 DIAGNOSIS — E876 Hypokalemia: Secondary | ICD-10-CM | POA: Diagnosis present

## 2015-05-06 DIAGNOSIS — I872 Venous insufficiency (chronic) (peripheral): Secondary | ICD-10-CM | POA: Diagnosis present

## 2015-05-06 DIAGNOSIS — M7989 Other specified soft tissue disorders: Secondary | ICD-10-CM

## 2015-05-06 DIAGNOSIS — S8011XA Contusion of right lower leg, initial encounter: Secondary | ICD-10-CM | POA: Insufficient documentation

## 2015-05-06 DIAGNOSIS — E871 Hypo-osmolality and hyponatremia: Secondary | ICD-10-CM | POA: Diagnosis present

## 2015-05-06 DIAGNOSIS — Z8669 Personal history of other diseases of the nervous system and sense organs: Secondary | ICD-10-CM | POA: Insufficient documentation

## 2015-05-06 DIAGNOSIS — Z7982 Long term (current) use of aspirin: Secondary | ICD-10-CM

## 2015-05-06 DIAGNOSIS — E1122 Type 2 diabetes mellitus with diabetic chronic kidney disease: Secondary | ICD-10-CM | POA: Diagnosis present

## 2015-05-06 DIAGNOSIS — R6520 Severe sepsis without septic shock: Secondary | ICD-10-CM | POA: Diagnosis present

## 2015-05-06 DIAGNOSIS — E860 Dehydration: Secondary | ICD-10-CM

## 2015-05-06 DIAGNOSIS — IMO0002 Reserved for concepts with insufficient information to code with codable children: Secondary | ICD-10-CM

## 2015-05-06 DIAGNOSIS — E1065 Type 1 diabetes mellitus with hyperglycemia: Secondary | ICD-10-CM

## 2015-05-06 DIAGNOSIS — Y9389 Activity, other specified: Secondary | ICD-10-CM

## 2015-05-06 DIAGNOSIS — M79609 Pain in unspecified limb: Secondary | ICD-10-CM

## 2015-05-06 LAB — CBC WITH DIFFERENTIAL/PLATELET
BASOS ABS: 0 10*3/uL (ref 0.0–0.1)
Basophils Absolute: 0 10*3/uL (ref 0.0–0.1)
Basophils Relative: 0 %
Basophils Relative: 0 %
EOS ABS: 0 10*3/uL (ref 0.0–0.7)
EOS PCT: 0 %
EOS PCT: 0 %
Eosinophils Absolute: 0 10*3/uL (ref 0.0–0.7)
HCT: 38.3 % — ABNORMAL LOW (ref 39.0–52.0)
HCT: 39.2 % (ref 39.0–52.0)
HEMOGLOBIN: 13.5 g/dL (ref 13.0–17.0)
Hemoglobin: 13.2 g/dL (ref 13.0–17.0)
LYMPHS ABS: 0.9 10*3/uL (ref 0.7–4.0)
LYMPHS PCT: 6 %
LYMPHS PCT: 2 %
Lymphs Abs: 0.3 10*3/uL — ABNORMAL LOW (ref 0.7–4.0)
MCH: 31.2 pg (ref 26.0–34.0)
MCH: 31.1 pg (ref 26.0–34.0)
MCHC: 34.5 g/dL (ref 30.0–36.0)
MCHC: 34.4 g/dL (ref 30.0–36.0)
MCV: 90.5 fL (ref 78.0–100.0)
MCV: 90.3 fL (ref 78.0–100.0)
MONO ABS: 0.8 10*3/uL (ref 0.1–1.0)
Monocytes Absolute: 0.6 10*3/uL (ref 0.1–1.0)
Monocytes Relative: 5 %
Monocytes Relative: 3 %
NEUTROS ABS: 15.8 10*3/uL — AB (ref 1.7–7.7)
NEUTROS PCT: 95 %
Neutro Abs: 13.8 10*3/uL — ABNORMAL HIGH (ref 1.7–7.7)
Neutrophils Relative %: 89 %
PLATELETS: 217 10*3/uL (ref 150–400)
PLATELETS: 221 10*3/uL (ref 150–400)
RBC: 4.23 MIL/uL (ref 4.22–5.81)
RBC: 4.34 MIL/uL (ref 4.22–5.81)
RDW: 12.6 % (ref 11.5–15.5)
RDW: 12.7 % (ref 11.5–15.5)
WBC: 15.6 10*3/uL — ABNORMAL HIGH (ref 4.0–10.5)
WBC: 16.7 10*3/uL — AB (ref 4.0–10.5)

## 2015-05-06 LAB — BASIC METABOLIC PANEL
Anion gap: 9 (ref 5–15)
BUN: 19 mg/dL (ref 6–20)
CALCIUM: 8.4 mg/dL — AB (ref 8.9–10.3)
CO2: 28 mmol/L (ref 22–32)
CREATININE: 1.13 mg/dL (ref 0.61–1.24)
Chloride: 95 mmol/L — ABNORMAL LOW (ref 101–111)
GFR calc Af Amer: >60 mL/min (ref >60)
GLUCOSE: 431 mg/dL — AB (ref 65–99)
Potassium: 4.3 mmol/L (ref 3.5–5.1)
SODIUM: 132 mmol/L — AB (ref 135–145)

## 2015-05-06 LAB — I-STAT CG4 LACTIC ACID, ED
LACTIC ACID, VENOUS: 5.4 mmol/L — AB (ref 0.5–2.0)
LACTIC ACID, VENOUS: 2.96 mmol/L — AB (ref 0.5–2.0)

## 2015-05-06 LAB — COMPREHENSIVE METABOLIC PANEL
ALT: 26 U/L (ref 17–63)
ANION GAP: 14 (ref 5–15)
AST: 34 U/L (ref 15–41)
Albumin: 3.2 g/dL — ABNORMAL LOW (ref 3.5–5.0)
Alkaline Phosphatase: 105 U/L (ref 38–126)
BILIRUBIN TOTAL: 1.3 mg/dL — AB (ref 0.3–1.2)
BUN: 25 mg/dL — AB (ref 6–20)
CO2: 23 mmol/L (ref 22–32)
Calcium: 8.1 mg/dL — ABNORMAL LOW (ref 8.9–10.3)
Chloride: 91 mmol/L — ABNORMAL LOW (ref 101–111)
Creatinine, Ser: 1.54 mg/dL — ABNORMAL HIGH (ref 0.61–1.24)
GFR, EST NON AFRICAN AMERICAN: 53 mL/min — AB (ref >60)
Glucose, Bld: 517 mg/dL — ABNORMAL HIGH (ref 65–99)
POTASSIUM: 4.6 mmol/L (ref 3.5–5.1)
Sodium: 128 mmol/L — ABNORMAL LOW (ref 135–145)
TOTAL PROTEIN: 6.8 g/dL (ref 6.5–8.1)

## 2015-05-06 LAB — D-DIMER, QUANTITATIVE: D-Dimer, Quant: 0.8 ug/mL-FEU — ABNORMAL HIGH (ref 0.00–0.50)

## 2015-05-06 LAB — CBG MONITORING, ED: Glucose-Capillary: 403 mg/dL — ABNORMAL HIGH (ref 65–99)

## 2015-05-06 MED ORDER — VANCOMYCIN HCL IN DEXTROSE 750-5 MG/150ML-% IV SOLN: 750.0000 mg | Freq: Two times a day (BID) | INTRAVENOUS | Status: DC

## 2015-05-06 MED ORDER — VANCOMYCIN HCL 10 G IV SOLR
2000.0000 mg | INTRAVENOUS | Status: AC
  Administered 2015-05-06: 2000 mg via INTRAVENOUS
  Filled 2015-05-06: qty 2000

## 2015-05-06 MED ORDER — PIPERACILLIN-TAZOBACTAM 3.375 G IVPB 30 MIN
3.3750 g | Freq: Once | INTRAVENOUS | Status: DC
  Filled 2015-05-06: qty 50

## 2015-05-06 MED ORDER — VANCOMYCIN HCL IN DEXTROSE 1-5 GM/200ML-% IV SOLN: 1000.0000 mg | Freq: Once | INTRAVENOUS | Status: DC

## 2015-05-06 MED ORDER — SODIUM CHLORIDE 0.9 % IV BOLUS (SEPSIS): 500.0000 mL | Freq: Once | INTRAVENOUS | Status: DC

## 2015-05-06 MED ORDER — ACETAMINOPHEN 325 MG PO TABS: 650.0000 mg | ORAL_TABLET | ORAL | Status: DC | PRN

## 2015-05-06 MED ORDER — INSULIN LISPRO 100 UNIT/ML ~~LOC~~ SOLN: SUBCUTANEOUS | Status: DC

## 2015-05-06 MED ORDER — SODIUM CHLORIDE 0.9 % IV BOLUS (SEPSIS)
1000.0000 mL | Freq: Once | INTRAVENOUS | Status: AC
Start: 2015-05-06 — End: 2015-05-06
  Administered 2015-05-06: 1000 mL via INTRAVENOUS

## 2015-05-06 MED ORDER — SODIUM CHLORIDE 0.9 % IV BOLUS (SEPSIS)
500.0000 mL | INTRAVENOUS | Status: AC
  Administered 2015-05-06: 500 mL via INTRAVENOUS

## 2015-05-06 MED ORDER — SODIUM CHLORIDE 0.9 % IV BOLUS (SEPSIS)
1000.0000 mL | INTRAVENOUS | Status: AC
  Administered 2015-05-06 (×3): 1000 mL via INTRAVENOUS

## 2015-05-06 MED ORDER — PIPERACILLIN-TAZOBACTAM 3.375 G IVPB: 3.3750 g | Freq: Three times a day (TID) | INTRAVENOUS | Status: DC

## 2015-05-06 MED ORDER — INSULIN ASPART 100 UNIT/ML ~~LOC~~ SOLN
4.0000 [IU] | Freq: Three times a day (TID) | SUBCUTANEOUS | Status: DC
  Administered 2015-05-07 – 2015-05-09 (×8): 4 [IU] via SUBCUTANEOUS

## 2015-05-06 MED ORDER — INSULIN ASPART 100 UNIT/ML ~~LOC~~ SOLN
15.0000 [IU] | Freq: Once | SUBCUTANEOUS | Status: AC
  Administered 2015-05-06: 15 [IU] via SUBCUTANEOUS
  Filled 2015-05-06: qty 1

## 2015-05-06 MED ORDER — INSULIN GLARGINE 100 UNIT/ML ~~LOC~~ SOLN
40.0000 [IU] | Freq: Every day | SUBCUTANEOUS | Status: DC
  Administered 2015-05-07: 40 [IU] via SUBCUTANEOUS
  Filled 2015-05-06: qty 0.4

## 2015-05-06 MED ORDER — PIPERACILLIN-TAZOBACTAM 3.375 G IVPB 30 MIN
3.3750 g | INTRAVENOUS | Status: AC
  Administered 2015-05-06: 3.375 g via INTRAVENOUS
  Filled 2015-05-06: qty 50

## 2015-05-06 MED ORDER — OXYCODONE-ACETAMINOPHEN 5-325 MG PO TABS: 1.0000 | ORAL_TABLET | ORAL | Status: DC | PRN

## 2015-05-06 MED ORDER — HYDRALAZINE HCL 20 MG/ML IJ SOLN
5.0000 mg | INTRAMUSCULAR | Status: DC | PRN
  Administered 2015-05-07 (×2): 5 mg via INTRAVENOUS
  Filled 2015-05-06 (×2): qty 1

## 2015-05-06 MED ORDER — IOHEXOL 300 MG/ML  SOLN
80.0000 mL | Freq: Once | INTRAMUSCULAR | Status: AC | PRN
  Administered 2015-05-06: 80 mL via INTRAVENOUS

## 2015-05-06 MED ORDER — ACETAMINOPHEN 325 MG PO TABS
650.0000 mg | ORAL_TABLET | Freq: Once | ORAL | Status: AC
  Administered 2015-05-06: 650 mg via ORAL
  Filled 2015-05-06: qty 2

## 2015-05-06 MED ORDER — INSULIN ASPART 100 UNIT/ML ~~LOC~~ SOLN
0.0000 [IU] | Freq: Three times a day (TID) | SUBCUTANEOUS | Status: DC
  Administered 2015-05-07: 15 [IU] via SUBCUTANEOUS
  Administered 2015-05-07 (×2): 7 [IU] via SUBCUTANEOUS
  Administered 2015-05-08: 4 [IU] via SUBCUTANEOUS
  Administered 2015-05-08: 7 [IU] via SUBCUTANEOUS
  Administered 2015-05-08: 4 [IU] via SUBCUTANEOUS
  Administered 2015-05-09 (×2): 3 [IU] via SUBCUTANEOUS

## 2015-05-06 MED ORDER — SODIUM CHLORIDE 0.9 % IV BOLUS (SEPSIS)
500.0000 mL | Freq: Once | INTRAVENOUS | Status: AC
  Administered 2015-05-06: 500 mL via INTRAVENOUS

## 2015-05-06 NOTE — ED Notes (Signed)
Lactic acid = 5.4, PA Lisa notified

## 2015-05-06 NOTE — Progress Notes (Signed)
Preliminary results by tech - Right Lower Ext. Venous Duplex Completed. No evidence of deep or superficial vein thrombosis in the right leg. Enlarged lymph nodes were noted in the right groin and proximal thigh. Oda Cogan, BS, RDMS, RVT

## 2015-05-06 NOTE — Progress Notes (Signed)
Pt placed on CPAP qhs.  Machine plugged into red outlet, humidifier filled with sterile water, and machine set to automode 5-20 cm H2O.  Pt resting stable and comfortable.  RT will continue to monitor as needed.

## 2015-05-06 NOTE — ED Provider Notes (Signed)
CSN: [id number]     Arrival date & time 05/06/15  [id number] History  By signing my name below, I, Irene Pap, attest that this documentation has been prepared under the direction and in the presence of Orpah Greek, MD. Electronically Signed: Irene Pap, ED Scribe. 05/06/2015. 3:57 AM.  Chief Complaint  Patient presents with  . Leg Pain   The history is provided by the patient. No language interpreter was used.  HPI Comments: Alex Ryan is a 44 y.o. male with a hx of HTN, DM, neuropathy, and bilateral leg cellulitis who presents to the Emergency Department complaining of gradually worsening, shooting right leg pain onset 2 weeks ago. Pt states that he tripped and fell while at church, landing on his right knee. He reports that he was pain free at first, but it continued to worsen. He reports that the pain goes up to his upper thighs. Pt reports that his sugars have been uncontrolled because he does not have insurance to pay for medications. He has not taken anything for his symptoms. He denies new numbness, weakness, or color change.   Past Medical History  Diagnosis Date  . Hypertension   . Anxiety   . Shingles 2010  . Allergic rhinitis   . Diabetes mellitus     sees Dr. Jacelyn Pi   . Retinopathy of both eyes     sees Dr. Zadie Rhine   . Neuropathy in diabetes ([place])   . Cellulitis     legs - hx  . OSA (obstructive sleep apnea)     CPAP- not wearing  . Recurrent upper respiratory infection (URI)     frequent bronchcitis  . H/O hiatal hernia     pt thinks so  . Headache(784.0)     had migraine- not in years  . Retinopathy     right eye  . Asthma   . Chronic kidney disease   . Bladder infection     in June -Was on Cipro  . Heart murmur     Followed by Dr Stanford Breed  . Hyperlipidemia    Past Surgical History  Procedure Laterality Date  . Tonsillectomy  1983  . Eye surgery  2012    Cataract with lens  Right  . Pars plana vitrectomy w/ scleral buckle  09/01/2011     Procedure: PARS PLANA VITRECTOMY WITH LASER FOR MACULAR HOLE;  Surgeon: Hurman Horn, MD;  Location: [facility];  Service: Ophthalmology;  Laterality: Right;  Pars Plana Vitrectomy with Laser and Membrane Peel; Insertion Silicone Oil  . Pars plana vitrectomy  12/04/2011    Procedure: PARS PLANA VITRECTOMY WITH 25 GAUGE;  Surgeon: Hurman Horn, MD;  Location: [facility];  Service: Ophthalmology;  Laterality: Left;  Insertion Silicon Oil [id number] CS and    . Vitrectomy  2013    per Dr. Zadie Rhine   Family History  Problem Relation Age of Onset  . Diabetes Mother   . Transient ischemic attack Father   . Cancer Father   . Anesthesia problems Neg Hx   . Diabetes Maternal Uncle   . Diabetes Maternal Grandmother   . Heart disease Maternal Grandfather    Social History  Substance Use Topics  . Smoking status: Former Smoker -- 0.30 packs/day for 29 years    Quit date: 08/16/2009  . Smokeless tobacco: Former Systems developer     Comment: started at age 24.  smoked to 9 or 6 cigs a day.   . Alcohol Use: No  Review of Systems  Musculoskeletal: Positive for arthralgias.  Skin: Negative for color change.  Neurological: Negative for weakness and numbness.  All other systems reviewed and are negative.  Allergies  Codeine; Erythromycin; Other; and Soap  Home Medications   Prior to Admission medications   Medication Sig Start Date End Date Taking? Authorizing Provider  albuterol (PROVENTIL HFA;VENTOLIN HFA) 108 (90 BASE) MCG/ACT inhaler Inhale 2 puffs into the lungs every 4 (four) hours as needed for wheezing or shortness of breath. PRN:  Allergies/breathing 06/20/12  Yes Laurey Morale, MD  amLODipine (NORVASC) 5 MG tablet TAKE 1 TABLET BY MOUTH DAILY. 01/04/15  Yes Laurey Morale, MD  aspirin EC 81 MG tablet Take 81 mg by mouth daily.   Yes Historical Provider, MD  diazepam (VALIUM) 5 MG tablet Take 1 tablet (5 mg total) by mouth every 12 (twelve) hours as needed for anxiety. 02/05/14  Yes Laurey Morale, MD   FLUoxetine (PROZAC) 40 MG capsule Take 1 capsule (40 mg total) by mouth daily. 05/29/14  Yes Laurey Morale, MD  furosemide (LASIX) 40 MG tablet TAKE 1 TABLET BY MOUTH TWICE DAILY AS NEEDED FOR FLUID RETENTION 06/05/14  Yes Laurey Morale, MD  gabapentin (NEURONTIN) 100 MG capsule Take 100-300 mg by mouth 3 (three) times daily as needed (for pain.).   Yes Historical Provider, MD  insulin lispro (HUMALOG) 100 UNIT/ML injection Take 45 units at breakfast and supper and at least 20 units at lunch.  Blood sugars before lunch, dinner and bedtime should be at least under 200 Patient taking differently: Inject 45 Units into the skin daily.  10/12/14  Yes Elayne Snare, MD  losartan (COZAAR) 50 MG tablet TAKE 1 TABLET BY MOUTH 2 TIMES DAILY. 07/11/14  Yes Laurey Morale, MD  metoprolol succinate (TOPROL-XL) 100 MG 24 hr tablet TAKE 1 TABLET BY MOUTH ONCE DAILY 02/18/15  Yes Laurey Morale, MD  Alcohol Swabs PADS Use up to 10 times per day for testing and insulin injections, diagnosis code is 250.00 Patient not taking: Reported on 02/13/2015 02/01/13   Laurey Morale, MD  Canagliflozin-Metformin HCl (INVOKAMET) [phone] MG TABS Take 1 tablet twice a day Patient not taking: Reported on 02/13/2015 11/08/14   Elayne Snare, MD  cephALEXin (KEFLEX) 500 MG capsule Take 1 capsule (500 mg total) by mouth 4 (four) times daily. Patient not taking: Reported on 02/13/2015 01/15/15   Laurey Morale, MD  cetirizine (ZYRTEC) 10 MG tablet Take 1 tablet (10 mg total) by mouth daily. Patient not taking: Reported on 02/13/2015 08/23/12   Laurey Morale, MD  FLUoxetine (PROZAC) 40 MG capsule TAKE 1 CAPSULE BY MOUTH ONCE DAILY Patient not taking: Reported on 05/06/2015 02/28/15   Laurey Morale, MD  gabapentin (NEURONTIN) 300 MG capsule Take 1 capsule (300 mg total) by mouth 3 (three) times daily. Patient not taking: Reported on 05/06/2015 12/13/13 05/05/16  Laurey Morale, MD  Insulin Glargine (TOUJEO SOLOSTAR) 300 UNIT/ML SOPN Inject 60 Units into the  skin 2 (two) times daily. Patient not taking: Reported on 05/06/2015 10/12/14   Elayne Snare, MD  Needles & Syringes MISC Inject 2 ml intramuscular every 28 days. Patient not taking: Reported on 02/13/2015 07/26/14   Laurey Morale, MD  ondansetron (ZOFRAN) 4 MG tablet Take 1 tablet (4 mg total) by mouth every 8 (eight) hours as needed for nausea or vomiting. Patient not taking: Reported on 02/13/2015 05/27/13   Raquel Dagoberto Ligas, NP  pravastatin (PRAVACHOL)  20 MG tablet Take 1 tablet (20 mg total) by mouth daily. Patient not taking: Reported on 05/06/2015 10/12/14   Elayne Snare, MD  Testosterone Hinda Kehr) 30 MG/ACT SOLN Place 1 Act onto the skin daily. Patient not taking: Reported on 05/06/2015 02/13/15   Laurey Morale, MD   BP 155/82 mmHg  Pulse 99  Temp(Src) 99.4 F (37.4 C) (Oral)  Resp 16  SpO2 94% Physical Exam  Constitutional: He is oriented to person, place, and time. He appears well-developed and well-nourished. No distress.  HENT:  Head: Normocephalic and atraumatic.  Right Ear: Hearing normal.  Left Ear: Hearing normal.  Nose: Nose normal.  Mouth/Throat: Oropharynx is clear and moist and mucous membranes are normal.  Eyes: Conjunctivae and EOM are normal. Pupils are equal, round, and reactive to light.  Neck: Normal range of motion. Neck supple.  Cardiovascular: Regular rhythm, S1 normal and S2 normal.  Exam reveals no gallop and no friction rub.   No murmur heard. Pulmonary/Chest: Effort normal and breath sounds normal. No respiratory distress. He exhibits no tenderness.  Abdominal: Soft. Normal appearance and bowel sounds are normal. There is no hepatosplenomegaly. There is no tenderness. There is no rebound, no guarding, no tenderness at McBurney's point and negative Murphy's sign. No hernia.  Musculoskeletal: Normal range of motion. He exhibits edema and tenderness.  Bilateral lower extremity edema with chronic skin changes; several small ulcerations to the anterior lower leg, one large  one to the posterior medial right leg, none appear infected right now, no redness, swelling, or drainage; palpable DP pulses bilaterally; diffuse tenderness to the upper right thigh  Neurological: He is alert and oriented to person, place, and time. He has normal strength. No cranial nerve deficit or sensory deficit. Coordination normal. GCS eye subscore is 4. GCS verbal subscore is 5. GCS motor subscore is 6.  Skin: Skin is warm, dry and intact. No rash noted. No cyanosis.  Psychiatric: He has a normal mood and affect. His speech is normal and behavior is normal. Thought content normal.  Nursing note and vitals reviewed.   ED Course  Procedures (including critical care time) DIAGNOSTIC STUDIES: Oxygen Saturation is 94% on RA, adequate by my interpretation.    COORDINATION OF CARE: 3:27 AM-Discussed treatment plan which includes x-ray with pt at bedside and pt agreed to plan.    Labs Review Labs Reviewed  CBC WITH DIFFERENTIAL/PLATELET - Abnormal; Notable for the following:    WBC 15.6 (*)    HCT 38.3 (*)    Neutro Abs 13.8 (*)    All other components within normal limits  BASIC METABOLIC PANEL - Abnormal; Notable for the following:    Sodium 132 (*)    Chloride 95 (*)    Glucose, Bld 431 (*)    Calcium 8.4 (*)    All other components within normal limits  D-DIMER, QUANTITATIVE (NOT AT [facility]) - Abnormal; Notable for the following:    D-Dimer, Quant 0.80 (*)    All other components within normal limits    Imaging Review Dg Hip Unilat With Pelvis 2-3 Views Right  05/06/2015  CLINICAL DATA:  Fall with right leg pain.  Initial encounter. EXAM: DG HIP (WITH OR WITHOUT PELVIS) 2-3V RIGHT COMPARISON:  None. FINDINGS: There is no evidence of hip fracture or dislocation. There is no evidence of arthropathy or other focal bone abnormality. Small incidental trochanteric enthesophytes. Tubular calcification in the right hemipelvis, likely vas deferens calcification from patient's diabetes.  IMPRESSION: Negative. Electronically Signed  By: Monte Fantasia M.D.   On: 05/06/2015 04:43   Dg Femur, Min 2 Views Right  05/06/2015  CLINICAL DATA:  Fall last week with right leg pain. Initial encounter. EXAM: RIGHT FEMUR 2 VIEWS COMPARISON:  None. FINDINGS: The proximal femur is visualized on contemporaneous hip imaging. There is no evidence of fracture, dislocation, or focal bone lesion. No soft tissue gas or opaque foreign body. IMPRESSION: No acute finding. Electronically Signed   By: Monte Fantasia M.D.   On: 05/06/2015 04:47   I have personally reviewed and evaluated these images and lab results as part of my medical decision-making.   EKG Interpretation None      MDM   Final diagnoses:  None  hyperglycemia Contusion Venous stasis  Patient presents to the ER for evaluation of pain in his right leg. Patient reports right thigh pain and tenderness that worsens when he tries to bear weight. He did have a fall this past week. Patient has a history of chronic venous stasis with ulcers. Ulcers are scabbed over without any drainage and there is no associated induration or erythema to suggest infection.  X-ray of right hip and femur was negative for acute injury secondary to the fall. D-dimer is mildly elevated, consider DVT. Will perform outpatient DVT study. Patient is hyperglycemic, admits that he has been off of some of his medications because of inability to afford his medications.  I personally performed the services described in this documentation, which was scribed in my presence. The recorded information has been reviewed and is accurate.     Orpah Greek, MD 05/06/15 [id number]

## 2015-05-06 NOTE — Discharge Instructions (Signed)
Hyperglycemia °Hyperglycemia occurs when the glucose (sugar) in your blood is too high. Hyperglycemia can happen for many reasons, but it most often happens to people who do not know they have diabetes or are not managing their diabetes properly.  °CAUSES  °Whether you have diabetes or not, there are other causes of hyperglycemia. Hyperglycemia can occur when you have diabetes, but it can also occur in other situations that you might not be as aware of, such as: °Diabetes °· If you have diabetes and are having problems controlling your blood glucose, hyperglycemia could occur because of some of the following reasons: °¨ Not following your meal plan. °¨ Not taking your diabetes medications or not taking it properly. °¨ Exercising less or doing less activity than you normally do. °¨ Being sick. °Pre-diabetes °· This cannot be ignored. Before people develop Type 2 diabetes, they almost always have "pre-diabetes." This is when your blood glucose levels are higher than normal, but not yet high enough to be diagnosed as diabetes. Research has shown that some long-term damage to the body, especially the heart and circulatory system, may already be occurring during pre-diabetes. If you take action to manage your blood glucose when you have pre-diabetes, you may delay or prevent Type 2 diabetes from developing. °Stress °· If you have diabetes, you may be "diet" controlled or on oral medications or insulin to control your diabetes. However, you may find that your blood glucose is higher than usual in the hospital whether you have diabetes or not. This is often referred to as "stress hyperglycemia." Stress can elevate your blood glucose. This happens because of hormones put out by the body during times of stress. If stress has been the cause of your high blood glucose, it can be followed regularly by your caregiver. That way he/she can make sure your hyperglycemia does not continue to get worse or progress to  diabetes. °Steroids °· Steroids are medications that act on the infection fighting system (immune system) to block inflammation or infection. One side effect can be a rise in blood glucose. Most people can produce enough extra insulin to allow for this rise, but for those who cannot, steroids make blood glucose levels go even higher. It is not unusual for steroid treatments to "uncover" diabetes that is developing. It is not always possible to determine if the hyperglycemia will go away after the steroids are stopped. A special blood test called an A1c is sometimes done to determine if your blood glucose was elevated before the steroids were started. °SYMPTOMS °· Thirsty. °· Frequent urination. °· Dry mouth. °· Blurred vision. °· Tired or fatigue. °· Weakness. °· Sleepy. °· Tingling in feet or leg. °DIAGNOSIS  °Diagnosis is made by monitoring blood glucose in one or all of the following ways: °· A1c test. This is a chemical found in your blood. °· Fingerstick blood glucose monitoring. °· Laboratory results. °TREATMENT  °First, knowing the cause of the hyperglycemia is important before the hyperglycemia can be treated. Treatment may include, but is not be limited to: °· Education. °· Change or adjustment in medications. °· Change or adjustment in meal plan. °· Treatment for an illness, infection, etc. °· More frequent blood glucose monitoring. °· Change in exercise plan. °· Decreasing or stopping steroids. °· Lifestyle changes. °HOME CARE INSTRUCTIONS  °· Test your blood glucose as directed. °· Exercise regularly. Your caregiver will give you instructions about exercise. Pre-diabetes or diabetes which comes on with stress is helped by exercising. °· Eat wholesome,   balanced meals. Eat often and at regular, fixed times. Your caregiver or nutritionist will give you a meal plan to guide your sugar intake. °· Being at an ideal weight is important. If needed, losing as little as 10 to 15 pounds may help improve blood  glucose levels. °SEEK MEDICAL CARE IF:  °· You have questions about medicine, activity, or diet. °· You continue to have symptoms (problems such as increased thirst, urination, or weight gain). °SEEK IMMEDIATE MEDICAL CARE IF:  °· You are vomiting or have diarrhea. °· Your breath smells fruity. °· You are breathing faster or slower. °· You are very sleepy or incoherent. °· You have numbness, tingling, or pain in your feet or hands. °· You have chest pain. °· Your symptoms get worse even though you have been following your caregiver's orders. °· If you have any other questions or concerns. °  °This information is not intended to replace advice given to you by your health care provider. Make sure you discuss any questions you have with your health care provider. °  °Document Released: 10/28/2000 Document Revised: 07/27/2011 Document Reviewed: 01/08/2015 °Elsevier Interactive Patient Education ©2016 [employer] ° °

## 2015-05-06 NOTE — ED Notes (Signed)
Patient transported to X-ray 

## 2015-05-06 NOTE — ED Provider Notes (Signed)
Medical screening examination/treatment/procedure(s) were conducted as a shared visit with non-physician practitioner(s) and myself.  I personally evaluated the patient during the encounter.   EKG Interpretation None     Patient here with right lower extremity edema pain as well as erythema with fever. On exam, patient's right lower ext is grossly tender to touch without crepitus. Patient started on Zosyn and vancomycin as well as will have CT to rule out necrotizing fasciitis  Lacretia Leigh, MD 05/06/15 [phone]

## 2015-05-06 NOTE — Progress Notes (Signed)
   Subjective:    Patient ID: Alex Ryan, male    DOB: [birth year], 44 y.o.   MRN: [id number]  HPI Here with his wife to follow up an ER visit early this morning for right leg pain. He has a hx of cellulitis of the legs on multiple occasions and this is worsened by the fact that his diabetes is very poorly controlled. He has stopped all diabetic meds because he cannot afford them. He developed acute swelling and pain in the right leg yesterday and in the ER this morning he had a venoud doppler that ruled out any DVT. This di demonstrate however enlarged right inguinal lymph nodes. His WBC was elevated to 15.6 and a left shift was present. He had a mild fever at the time but apparently no thought was given to treating the infection. His glucose was very high at 431 this morning. He has been weak and nauseated but has not vomited. He has had very little fluids today and has not eaten any food.    Review of Systems  Constitutional: Positive for fever, chills and diaphoresis.  HENT: Negative.   Eyes: Negative.   Respiratory: Negative.   Cardiovascular: Negative.   Gastrointestinal: Positive for nausea. Negative for vomiting, abdominal pain, diarrhea, constipation and abdominal distention.  Genitourinary: Negative.   Musculoskeletal: Positive for myalgias and gait problem.  Skin: Positive for color change.  Neurological: Negative.        Objective:   Physical Exam  Constitutional: He is oriented to person, place, and time.  In a wheelchair, too weak to stand without assistance. He appears quite ill, his body is shaky   Neck: No thyromegaly present.  Cardiovascular: Normal rate, regular rhythm, normal heart sounds and intact distal pulses.   Pulmonary/Chest: Effort normal and breath sounds normal.  Musculoskeletal:  The right leg is swollen, red, warm, and very tender   Lymphadenopathy:    He has no cervical adenopathy.  Neurological: He is alert and oriented to person, place, and time.            Assessment & Plan:  He has cellulitis in the right leg and is probably septic with this as well. He is dehydrated and his glucose is out of control. He needs to be admitted for IV hydration and antibiotics. He needs close glucose management. His wife will drive him back to [facility] ER for further treatment. We spoke to the ER staff about his condition.

## 2015-05-06 NOTE — ED Notes (Signed)
Pt sent for evaluation post PCP visit for fever and cellulitis; seen this morning here to rule DVT.

## 2015-05-06 NOTE — ED Notes (Signed)
Pt cannot use restroom at this time, aware urine specimen is needed.  

## 2015-05-06 NOTE — ED Notes (Signed)
Pt to xray

## 2015-05-06 NOTE — Progress Notes (Signed)
ANTIBIOTIC CONSULT NOTE - INITIAL  Pharmacy Consult for Vancomycin and Zosyn Indication: Cellulitis  Allergies  Allergen Reactions  . Codeine Hives    Hyper  . Erythromycin Nausea And Vomiting  . Other     Pre-op cleaning wipes:. itching  . Soap Itching    "dial soap"    Patient Measurements: Height: 5\' 10"  (177.8 cm) Weight: 237 lb (107.502 kg) IBW/kg (Calculated) : 73  Vital Signs: Temp: 99.2 F (37.3 C) (12/19 1802) Temp Source: Oral (12/19 1802) BP: 159/75 mmHg (12/19 1802) Pulse Rate: 104 (12/19 1802) Intake/Output from previous day:   Intake/Output from this shift:    Labs:  Recent Labs  05/06/15 0343  WBC 15.6*  HGB 13.2  PLT 217  CREATININE 1.13   Estimated Creatinine Clearance: 101.4 mL/min (by C-G formula based on Cr of 1.13). No results for input(s): VANCOTROUGH, VANCOPEAK, VANCORANDOM, GENTTROUGH, GENTPEAK, GENTRANDOM, TOBRATROUGH, TOBRAPEAK, TOBRARND, AMIKACINPEAK, AMIKACINTROU, AMIKACIN in the last 72 hours.   Microbiology: No results found for this or any previous visit (from the past 720 hour(s)).  Medical History: Past Medical History  Diagnosis Date  . Hypertension   . Anxiety   . Shingles 2010  . Allergic rhinitis   . Diabetes mellitus     sees Dr. Jacelyn Pi   . Retinopathy of both eyes     sees Dr. Zadie Rhine   . Neuropathy in diabetes ([place])   . Cellulitis     legs - hx  . OSA (obstructive sleep apnea)     CPAP- not wearing  . Recurrent upper respiratory infection (URI)     frequent bronchcitis  . H/O hiatal hernia     pt thinks so  . Headache(784.0)     had migraine- not in years  . Retinopathy     right eye  . Asthma   . Chronic kidney disease   . Bladder infection     in June -Was on Cipro  . Heart murmur     Followed by Dr Stanford Breed  . Hyperlipidemia     Medications:  Scheduled:  . acetaminophen  650 mg Oral Once  . [START ON 05/07/2015] vancomycin  750 mg Intravenous Q12H   Infusions:  .  piperacillin-tazobactam    . [START ON 05/07/2015] piperacillin-tazobactam (ZOSYN)  IV    . sodium chloride    . vancomycin     PRN:   Assessment: 44 year old male with history of hypertension, anxiety, shingles, diabetes, sleep apnea, asthma, chronic kidney disease presenting to the ED for right leg infection. Patient was seen in the ED earlier this morning for the same, and was discharged after brief evaluation with refills of his insulin but antibiotics were not started. He did have an outpatient venous duplex this morning which was negative for DVT.  Pharmacy is now consulted to dose vancomycin and Zosyn for cellulitis.  Goal of Therapy:  Vancomycin trough level 15-20 mcg/ml  Zosyn dosing per indication and renal function  Plan:   Vancomycin 2g IV x 1, then 750mg  IV q12h Check trough at steady state Zosyn 3.375gm IV q8h (4hr extended infusions) Follow up renal function & cultures, clinical course  Peggyann Juba, PharmD, BCPS Pager: [phone] 05/06/2015,6:41 PM

## 2015-05-06 NOTE — ED Notes (Signed)
Patient presents with fever, N/V and redness/cellulitis to upper right thigh.  Patient has chronic venous stasis to bilateral lower extremities.  Patient states he was seen in ED last night for same complaints and directed for DVT rule out study today.  Patient had no DVT and was directed to PCP who sent him here.  Patient has area of redness extending from right upper thigh/groin to right knee.  Patient has not had anything much to eat or drink today because he is nauseated. Patient's skin is warm/dry to touch.

## 2015-05-06 NOTE — ED Notes (Addendum)
Pt reports hx of cellulitis on R leg. States that he fell last week landing on his R knee and leg and has had increased pain since. Lower legs appear red, swollen, and flaky bilaterally. Red sore about 4 in in diameter noted on medial side of R leg. Hx of diabetes. Reports no sensation in his feet. Patient unable to control sugar at home due to lack of funds. Also reporting 3 episodes of emesis before arrival.

## 2015-05-06 NOTE — H&P (Signed)
[facility] History and Physical  [name] [id number] DOB: 05/10/1971 DOA: 05/06/2015  Referring physician: ED physician PCP: Laurey Morale, MD  Specialists:   Chief Complaint: Right leg swelling, redness, pain; fevers  HPI: Alex Ryan is a 44 y.o. male with PMH of hypertension, hypertension, insulin-dependent diabetes, skin/soft tissue infections, and OSA who presents to the ED at the direction of his primary care physician for evaluation of right lower extremity cellulitis with systemic involvement. Alex Ryan has lost his insurance coverage and has gone without insulin and his other medications for more than one month. He has chronic venous stasis with stasis ulcers, and history of infection of those on his left lower extremity. 4 days ago, he began to complain of a cramp in his right thigh with intermittent tenderness, and there was concomitant development of purulent drainage from an ulcer near the right ankle. There were subjective fevers for the last 2 days. Right leg pain worsened acutely overnight, prompting a visit to the emergency department early in the morning of 05/06/2015. He was afebrile at that time, ruled out for DVT, and discharged home. His condition continued to worsen at home with increased swelling, redness, and tenderness of the right thigh, and continued fevers. His wife is at the bedside and notes that he had been confused upon his arrival to the emergency department and his not urinating much over the last couple days.  In ED, patient was found to be febrile to 39.2 with sinus tachycardia. Initial blood work revealed a leukocytosis to 17,000, lactic acid of 5.0, and serum creatinine of 1.5, up from a baseline of 0.8. Serum glucose was in the 500 range without features of HHS or DKA. Venous Dopplers were negative for lower extremity DVT and a contrast-enhanced CT of the right lower extremity demonstrated marked subcutaneous edema and inguinal adenopathy, but no  air or abscess. Patient was bolused 30 mL/kg normal saline, blood cultures were obtained, and empiric doses of vancomycin and Zosyn were administered. Patient remained hemodynamically stable in the emergency department on the hospitalists were called to admit.  Where does patient live?   At home    Can patient participate in ADLs?  Yes       Review of Systems:   General: fevers and chills, no sweats, weight change, poor appetite, or fatigue HEENT: no blurry vision, hearing changes or sore throat Pulm: no dyspnea, cough, or wheeze CV: no chest pain or palpitations Abd: no nausea, vomiting, abdominal pain, diarrhea, or constipation GU: no dysuria, hematuria, increased urinary frequency, or urgency, positive for decreased urine output  Ext: Bilateral leg swelling, right worse than left  Neuro: no focal weakness, numbness, or tingling, no vision change or hearing loss Skin: Chronic ulcers on bilateral lower extremities distally, right leg erythematous, tender, and swollen MSK: No muscle spasm, no deformity, no red, hot, or swollen joint Heme: No easy bruising or bleeding Travel history: No recent long distant travel    Allergy:  Allergies  Allergen Reactions  . Codeine Hives    Hyper  . Erythromycin Nausea And Vomiting  . Other     Pre-op cleaning wipes:. itching  . Soap Itching    "dial soap"    Past Medical History  Diagnosis Date  . Hypertension   . Anxiety   . Shingles 2010  . Allergic rhinitis   . Diabetes mellitus     sees Dr. Jacelyn Pi   . Retinopathy of both eyes     sees Dr.  Rankin   . Neuropathy in diabetes ([place])   . Cellulitis     legs - hx  . OSA (obstructive sleep apnea)     CPAP- not wearing  . Recurrent upper respiratory infection (URI)     frequent bronchcitis  . H/O hiatal hernia     pt thinks so  . Headache(784.0)     had migraine- not in years  . Retinopathy     right eye  . Asthma   . Chronic kidney disease   . Bladder infection     in  June -Was on Cipro  . Heart murmur     Followed by Dr Stanford Breed  . Hyperlipidemia     Past Surgical History  Procedure Laterality Date  . Tonsillectomy  1983  . Eye surgery  2012    Cataract with lens  Right  . Pars plana vitrectomy w/ scleral buckle  09/01/2011    Procedure: PARS PLANA VITRECTOMY WITH LASER FOR MACULAR HOLE;  Surgeon: Hurman Horn, MD;  Location: [facility];  Service: Ophthalmology;  Laterality: Right;  Pars Plana Vitrectomy with Laser and Membrane Peel; Insertion Silicone Oil  . Pars plana vitrectomy  12/04/2011    Procedure: PARS PLANA VITRECTOMY WITH 25 GAUGE;  Surgeon: Hurman Horn, MD;  Location: [facility];  Service: Ophthalmology;  Laterality: Left;  Insertion Silicon Oil [id number] CS and    . Vitrectomy  2013    per Dr. Zadie Rhine    Social History:  reports that he quit smoking about 5 years ago. He has quit using smokeless tobacco. He reports that he does not drink alcohol or use illicit drugs.  Family History:  Family History  Problem Relation Age of Onset  . Diabetes Mother   . Transient ischemic attack Father   . Cancer Father   . Anesthesia problems Neg Hx   . Diabetes Maternal Uncle   . Diabetes Maternal Grandmother   . Heart disease Maternal Grandfather      Prior to Admission medications   Medication Sig Start Date End Date Taking? Authorizing Provider  albuterol (PROVENTIL HFA;VENTOLIN HFA) 108 (90 BASE) MCG/ACT inhaler Inhale 2 puffs into the lungs every 4 (four) hours as needed for wheezing or shortness of breath. PRN:  Allergies/breathing 06/20/12  Yes Laurey Morale, MD  amLODipine (NORVASC) 5 MG tablet TAKE 1 TABLET BY MOUTH DAILY. 01/04/15  Yes Laurey Morale, MD  aspirin 325 MG tablet Take 325 mg by mouth daily.   Yes Historical Provider, MD  diazepam (VALIUM) 5 MG tablet Take 1 tablet (5 mg total) by mouth every 12 (twelve) hours as needed for anxiety. 02/05/14  Yes Laurey Morale, MD  FLUoxetine (PROZAC) 40 MG capsule Take 1 capsule (40 mg total) by mouth  daily. 05/29/14  Yes Laurey Morale, MD  gabapentin (NEURONTIN) 300 MG capsule Take 1 capsule (300 mg total) by mouth 3 (three) times daily. 12/13/13 05/05/16 Yes Laurey Morale, MD  losartan (COZAAR) 50 MG tablet TAKE 1 TABLET BY MOUTH 2 TIMES DAILY. 07/11/14  Yes Laurey Morale, MD  metoprolol succinate (TOPROL-XL) 100 MG 24 hr tablet TAKE 1 TABLET BY MOUTH ONCE DAILY 02/18/15  Yes Laurey Morale, MD  Alcohol Swabs PADS Use up to 10 times per day for testing and insulin injections, diagnosis code is 250.00 Patient not taking: Reported on 02/13/2015 02/01/13   Laurey Morale, MD  Canagliflozin-Metformin HCl (INVOKAMET) [phone] MG TABS Take 1 tablet twice a day Patient not  taking: Reported on 02/13/2015 11/08/14   Elayne Snare, MD  cephALEXin (KEFLEX) 500 MG capsule Take 1 capsule (500 mg total) by mouth 4 (four) times daily. Patient not taking: Reported on 02/13/2015 01/15/15   Laurey Morale, MD  cetirizine (ZYRTEC) 10 MG tablet Take 1 tablet (10 mg total) by mouth daily. Patient not taking: Reported on 02/13/2015 08/23/12   Laurey Morale, MD  FLUoxetine (PROZAC) 40 MG capsule TAKE 1 CAPSULE BY MOUTH ONCE DAILY Patient not taking: Reported on 05/06/2015 02/28/15   Laurey Morale, MD  furosemide (LASIX) 40 MG tablet TAKE 1 TABLET BY MOUTH TWICE DAILY AS NEEDED FOR FLUID RETENTION Patient not taking: Reported on 05/06/2015 06/05/14   Laurey Morale, MD  Insulin Glargine (TOUJEO SOLOSTAR) 300 UNIT/ML SOPN Inject 60 Units into the skin 2 (two) times daily. Patient not taking: Reported on 05/06/2015 10/12/14   Elayne Snare, MD  insulin lispro (HUMALOG) 100 UNIT/ML injection Use according to your sliding scale Patient not taking: Reported on 05/06/2015 05/06/15   Orpah Greek, MD  Needles & Syringes MISC Inject 2 ml intramuscular every 28 days. Patient not taking: Reported on 02/13/2015 07/26/14   Laurey Morale, MD  ondansetron (ZOFRAN) 4 MG tablet Take 1 tablet (4 mg total) by mouth every 8 (eight) hours as needed  for nausea or vomiting. Patient not taking: Reported on 02/13/2015 05/27/13   Raquel Dagoberto Ligas, NP  oxyCODONE-acetaminophen (PERCOCET) 5-325 MG tablet Take 1-2 tablets by mouth every 4 (four) hours as needed. Patient not taking: Reported on 05/06/2015 05/06/15   Orpah Greek, MD  pravastatin (PRAVACHOL) 20 MG tablet Take 1 tablet (20 mg total) by mouth daily. Patient not taking: Reported on 05/06/2015 10/12/14   Elayne Snare, MD  Testosterone Hinda Kehr) 30 MG/ACT SOLN Place 1 Act onto the skin daily. Patient not taking: Reported on 05/06/2015 02/13/15   Laurey Morale, MD    Physical Exam: Filed Vitals:   05/06/15 2130 05/06/15 2206 05/06/15 2323 05/06/15 2333  BP: 168/75 188/71  169/72  Pulse: 97 103  105  Temp:   102.3 F (39.1 C)   TempSrc:   Axillary   Resp: 26 30  18   Height:      Weight:      SpO2: 95% 93%  94%   General: Not in acute distress HEENT:       Eyes: PERRL, EOMI, no scleral icterus or conjunctival pallor.       ENT: No discharge from the ears or nose, no pharyngeal ulcers, petechiae or exudate, no tonsillar enlargement.        Neck: No JVD, no bruit, no appreciable mass Heme: No cervical adenopathy, no pallor Cardiac: S1/S2, RRR, No murmurs, No gallops or rubs. Pulm: Good air movement bilaterally. No rales, wheezing, rhonchi or rubs. Abd: Soft, nondistended, nontender, no rebound pain or gaurding, no mass or organomegaly, BS present. Ext: Bilateral lower extremities with hyperpigmentation in gaiter distribution and chronic ulcers about the ankles; bilateral pitting edema to the knee. Right leg, medial aspect, erythematous, hot, swollen, and tender from the ankle to the groin. Purulent discharge expressed from ulcers on anterolateral right leg Musculoskeletal: Aside from leg findings noted above, there are no gross deformity, no red, hot, swollen joints, no limitation in ROM  Skin: Aside from [facility] findings noted above, there is No rashes or wounds on exposed surfaces   Neuro: Alert, oriented X3, cranial nerves II-XII grossly intact. No focal findings Psych: Patient is not overtly  psychotic.  Labs on Admission:  Basic Metabolic Panel:  Recent Labs Lab 05/06/15 0343 05/06/15 1833  NA 132* 128*  K 4.3 4.6  CL 95* 91*  CO2 28 23  GLUCOSE 431* 517*  BUN 19 25*  CREATININE 1.13 1.54*  CALCIUM 8.4* 8.1*   Liver Function Tests:  Recent Labs Lab 05/06/15 1833  AST 34  ALT 26  ALKPHOS 105  BILITOT 1.3*  PROT 6.8  ALBUMIN 3.2*   No results for input(s): LIPASE, AMYLASE in the last 168 hours. No results for input(s): AMMONIA in the last 168 hours. CBC:  Recent Labs Lab 05/06/15 0343 05/06/15 1833  WBC 15.6* 16.7*  NEUTROABS 13.8* 15.8*  HGB 13.2 13.5  HCT 38.3* 39.2  MCV 90.5 90.3  PLT 217 221   Cardiac Enzymes: No results for input(s): CKTOTAL, CKMB, CKMBINDEX, TROPONINI in the last 168 hours.  BNP (last 3 results) No results for input(s): BNP in the last 8760 hours.  ProBNP (last 3 results) No results for input(s): PROBNP in the last 8760 hours.  CBG:  Recent Labs Lab 05/06/15 2044  GLUCAP 403*    Radiological Exams on Admission: Ct Femur Right W Contrast  05/06/2015  CLINICAL DATA:  Evaluate for cellulitis. Two days of right lower extremity pain and edema as well as erythema and fever. Tender to touch without crepitus. Evaluate for necrotizing fasciitis. Patient started on Zosyn and vancomycin. EXAM: CT OF THE LOWER RIGHT EXTREMITY WITH CONTRAST TECHNIQUE: Multidetector CT imaging of the right lower extremity from hip to ankle was performed according to the standard protocol following intravenous contrast administration. COMPARISON:  None. CONTRAST:  6mL OMNIPAQUE IOHEXOL 300 MG/ML  SOLN FINDINGS: The visualized pelvis demonstrates moderate bladder distention. There mild degenerate changes of the spine and right hip. Few small lymph nodes along the right external iliac chain. There is moderate right inguinal adenopathy  with the largest node measuring 3 x 5.5 cm. There is mild subcutaneous edema throughout the right lower extremity. There is no focal subcutaneous fluid collection. There are is no air within the soft tissues. Underlying muscle compartments within the upper and lower leg are within normal. No joint effusion within the right hip or knee. Underlying bony structures are within normal. There is minimal calcified plaque throughout the right femoral artery. There is definite evidence of venous thrombosis. IMPRESSION: Mild to moderate subcutaneous edema throughout the right lower extremity. No evidence of abscess within the subcutaneous tissues or muscle compartment. No air within the soft tissues to suggest necrotizing infection. Moderate right inguinal adenopathy. Findings likely due to cellulitis. Mild degenerative change of the right hip. Electronically Signed   By: Marin Olp M.D.   On: 05/06/2015 21:01   Ct Tibia Fibula Right W Contrast  05/06/2015  CLINICAL DATA:  Evaluate for cellulitis. Two days of right lower extremity pain and edema as well as erythema and fever. Tender to touch without crepitus. Evaluate for necrotizing fasciitis. Patient started on Zosyn and vancomycin. EXAM: CT OF THE LOWER RIGHT EXTREMITY WITH CONTRAST TECHNIQUE: Multidetector CT imaging of the right lower extremity from hip to ankle was performed according to the standard protocol following intravenous contrast administration. COMPARISON:  None. CONTRAST:  75mL OMNIPAQUE IOHEXOL 300 MG/ML  SOLN FINDINGS: The visualized pelvis demonstrates moderate bladder distention. There mild degenerate changes of the spine and right hip. Few small lymph nodes along the right external iliac chain. There is moderate right inguinal adenopathy with the largest node measuring 3 x 5.5 cm. There  is mild subcutaneous edema throughout the right lower extremity. There is no focal subcutaneous fluid collection. There are is no air within the soft tissues.  Underlying muscle compartments within the upper and lower leg are within normal. No joint effusion within the right hip or knee. Underlying bony structures are within normal. There is minimal calcified plaque throughout the right femoral artery. There is definite evidence of venous thrombosis. IMPRESSION: Mild to moderate subcutaneous edema throughout the right lower extremity. No evidence of abscess within the subcutaneous tissues or muscle compartment. No air within the soft tissues to suggest necrotizing infection. Moderate right inguinal adenopathy. Findings likely due to cellulitis. Mild degenerative change of the right hip. Electronically Signed   By: Marin Olp M.D.   On: 05/06/2015 21:01   Dg Hip Unilat With Pelvis 2-3 Views Right  05/06/2015  CLINICAL DATA:  Fall with right leg pain.  Initial encounter. EXAM: DG HIP (WITH OR WITHOUT PELVIS) 2-3V RIGHT COMPARISON:  None. FINDINGS: There is no evidence of hip fracture or dislocation. There is no evidence of arthropathy or other focal bone abnormality. Small incidental trochanteric enthesophytes. Tubular calcification in the right hemipelvis, likely vas deferens calcification from patient's diabetes. IMPRESSION: Negative. Electronically Signed   By: Monte Fantasia M.D.   On: 05/06/2015 04:43   Dg Femur, Min 2 Views Right  05/06/2015  CLINICAL DATA:  Fall last week with right leg pain. Initial encounter. EXAM: RIGHT FEMUR 2 VIEWS COMPARISON:  None. FINDINGS: The proximal femur is visualized on contemporaneous hip imaging. There is no evidence of fracture, dislocation, or focal bone lesion. No soft tissue gas or opaque foreign body. IMPRESSION: No acute finding. Electronically Signed   By: Monte Fantasia M.D.   On: 05/06/2015 04:47    EKG: Independently reviewed.  Abnormal findings:           Not done in ED, will obtain as appropriate   Assessment/Plan  1. Severe sepsis secondary to cellulitis - Presenting with fever, tachycardia,  leukocytosis, skin/soft tissue infection, AKI, and confusion - Source is likely a secondary bacterial infection of chronic venous stasis ulcer on the right leg - Risk factor is diabetes mellitus, uncontrolled - Patient received 30 mL/kg NS bolus in the ED and was never hypotensive - Blood cultures were obtained and empiric antibiotics started with vancomycin and Zosyn - Lactic acid was initially elevated to 5.40, came down to 2.96 with fluids, trending - Follow up MRSA PCR, clinical picture more consistent with Streptococcus, but given severity of illness will continue with vancomycin empirically while awaiting culture data - Monitor on telemetry in the stepdown unit  2. Acute kidney injury - Serum creatinine on admission 1.54, up from a baseline of 0.8 - Likely prerenal insult secondary to sepsis, ATN possible - Follow up urinalysis with micro - Suspect improvement with IV fluid resuscitation - Trend BMP  3. Hypertension - We will hold scheduled antihypertensives considering severe sepsis - Hydralazine IV pushes will be available when necessary - Monitoring on telemetry  4. Insulin-dependent diabetes mellitus with retinopathy - Last A1c, 11.6% on 07/18/2014 - Has been off of insulin since losing health insurance coverage more than 1 month ago - Serum glucose in the 500 range on admission without ketones, acidosis or anion gap - Suspect some improvement in serum glucose with the IV fluid hydration - Check hemoglobin A1c - Start basal insulin at 40 units tonight, with sliding scale regimen - CBG with meals and at bedtime, adjust insulin regimen accordingly  5. Hyponatremia, hypochloremia - Suspect secondary to sepsis and subsequent intravascular volume depletion - Anticipate improvement with aggressive IVF resuscitation - Repeat chemistries in the a.m.   DVT ppx: SQ Lovenox     Code Status: Full code Family Communication:  Yes, patient's wife at bed side Disposition Plan: Admit  to inpatient   Date of Service 05/06/2015    [name], MD [facility] Pager [phone]  If 7PM-7AM, please contact night-coverage [email or web address] Password [email or web address] 05/06/2015, 11:54 PM

## 2015-05-06 NOTE — ED Provider Notes (Signed)
CSN: [id number]     Arrival date & time 05/06/15  1736 History   First MD Initiated Contact with Patient 05/06/15 1816     Chief Complaint  Patient presents with  . Fever     (Consider location/radiation/quality/duration/timing/severity/associated sxs/prior Treatment) Patient is a 44 y.o. male presenting with fever. The history is provided by the patient and medical records.  Fever  This is a 44 year old male with history of hypertension, anxiety, shingles, diabetes, sleep apnea, asthma, chronic kidney disease presenting to the ED for right leg infection. Patient was seen in the ED earlier this morning for the same, and was discharged after brief evaluation with refills of his insulin but antibiotics were not started. Throughout the day, wife reports his condition aggressively worse and so she took him to his primary care physician who then sent him back here. Erythema and pain in right thigh began on Thursday as has been worsening since this time. He has developed fever, TMax 103F. He did have an outpatient venous duplex this morning which was negative for DVT. Patient has chronic venous stasis of bilateral lower extremities, he does have a reportedly new ulceration of right lower leg which has been draining some purulent fluid.  Patient has had very little oral intake today due to nausea. He denies any chest pain, shortness of breath, cough, or urinary symptoms. Patient is a known diabetic, he has recently lost his insurance after his wife lost his job. He has been unable to afford his home insulin, but has been taking some leftover Humalog that he had previously in hopes of controlling his blood sugar somewhat.  Past Surgical History  Procedure Laterality Date  . Tonsillectomy  1983  . Eye surgery  2012    Cataract with lens  Right  . 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 Pars plana vitrectomy  12/04/2011    Procedure: PARS PLANA VITRECTOMY WITH 25 GAUGE;  Surgeon: Hurman Horn, MD;  Location: [place];  Service: Ophthalmology;  Laterality: Left;  Insertion Silicon Oil [id number] CS and    . Vitrectomy  2013    per Dr. Zadie Rhine   Family History  Problem Relation Age of Onset  . Diabetes Mother   . Transient ischemic attack Father   . Cancer Father   . Anesthesia problems Neg Hx   . Diabetes Maternal Uncle   . Diabetes Maternal Grandmother   . Heart disease Maternal Grandfather    Social History  Substance Use Topics  . Smoking status: Former Smoker -- 0.30 packs/day for 29 years    Quit date: 08/16/2009  . Smokeless tobacco: Former Systems developer     Comment: started at age 64.  smoked to 51 or 6 cigs a day.   . Alcohol Use: No    Review of Systems  Constitutional: Positive for fever.  Skin: Positive for color change.  All other systems reviewed and are negative.     Allergies  Codeine; Erythromycin; Other; and Soap  Home Medications   Prior to Admission medications   Medication Sig Start Date End Date Taking? Authorizing Provider  albuterol (PROVENTIL HFA;VENTOLIN HFA) 108 (90 BASE) MCG/ACT inhaler Inhale 2 puffs into the lungs every 4 (four) hours as needed for wheezing or shortness  of breath. PRN:  Allergies/breathing 06/20/12   Laurey Morale, MD  Alcohol Swabs PADS Use up to 10 times per day for testing and insulin injections, diagnosis code is 250.00 Patient not taking: Reported on 02/13/2015 02/01/13   Laurey Morale, MD  amLODipine (NORVASC) 5 MG tablet TAKE 1 TABLET BY MOUTH DAILY. 01/04/15   Laurey Morale, MD  aspirin EC 81 MG tablet Take 81 mg by mouth daily.    Historical Provider, MD  Canagliflozin-Metformin HCl (INVOKAMET) [phone] MG TABS Take 1 tablet twice a day Patient not taking: Reported on 02/13/2015 11/08/14   Elayne Snare, MD  cephALEXin (KEFLEX) 500 MG capsule Take 1 capsule (500 mg total) by mouth 4 (four) times daily. Patient not taking: Reported on 02/13/2015 01/15/15   Laurey Morale, MD  cetirizine (ZYRTEC) 10 MG tablet Take 1 tablet (10 mg total) by mouth daily. Patient not taking: Reported on 02/13/2015 08/23/12   Laurey Morale, MD  diazepam (VALIUM) 5 MG tablet Take 1 tablet (5 mg total) by mouth every 12 (twelve) hours as needed for anxiety. 02/05/14   Laurey Morale, MD  FLUoxetine (PROZAC) 40 MG capsule Take 1 capsule (40 mg total) by mouth daily. 05/29/14   Laurey Morale, MD  FLUoxetine (PROZAC) 40 MG capsule TAKE 1 CAPSULE BY MOUTH ONCE DAILY Patient not taking: Reported on 05/06/2015 02/28/15   Laurey Morale, MD  furosemide (LASIX) 40 MG tablet TAKE 1 TABLET BY MOUTH TWICE DAILY AS NEEDED FOR FLUID RETENTION Patient not taking: Reported on 05/06/2015 06/05/14   Laurey Morale, MD  gabapentin (NEURONTIN) 100 MG capsule Take 100-300 mg by mouth 3 (three) times daily as needed (for pain.). Reported on 05/06/2015    Historical Provider, MD  gabapentin (NEURONTIN) 300 MG capsule Take 1 capsule (300 mg total) by mouth 3 (three) times daily. 12/13/13 05/05/16  Laurey Morale, MD  Insulin Glargine (TOUJEO SOLOSTAR) 300 UNIT/ML SOPN Inject 60 Units into the skin 2 (two) times daily. Patient not taking: Reported on 05/06/2015 10/12/14   Elayne Snare, MD  insulin lispro (HUMALOG) 100 UNIT/ML injection Use according to your sliding scale Patient not taking: Reported on 05/06/2015 05/06/15   Orpah Greek, MD  losartan (COZAAR) 50 MG tablet TAKE 1 TABLET BY MOUTH 2 TIMES DAILY. 07/11/14   Laurey Morale, MD  metoprolol succinate (TOPROL-XL) 100 MG 24 hr tablet TAKE 1 TABLET BY MOUTH ONCE DAILY 02/18/15   Laurey Morale, MD  Needles & Syringes MISC Inject 2 ml intramuscular every 28 days. Patient not taking: Reported on 02/13/2015 07/26/14   Laurey Morale, MD  ondansetron (ZOFRAN) 4 MG tablet Take 1 tablet (4 mg total)  by mouth every 8 (eight) hours as needed for nausea or vomiting. Patient not taking: Reported on 02/13/2015 05/27/13   Raquel Dagoberto Ligas, NP  oxyCODONE-acetaminophen (PERCOCET) 5-325 MG tablet Take 1-2 tablets by mouth every 4 (four) hours as needed. Patient not taking: Reported on 05/06/2015 05/06/15   Orpah Greek, MD  pravastatin (PRAVACHOL) 20 MG tablet Take 1 tablet (20 mg total) by mouth daily. Patient not taking: Reported on 05/06/2015 10/12/14   Elayne Snare, MD  Testosterone Hinda Kehr) 30 MG/ACT SOLN Place 1 Act onto the skin daily. Patient not taking: Reported on 05/06/2015 02/13/15   Laurey Morale, MD   BP 159/75 mmHg  Pulse 104  Temp(Src) 99.2 F (37.3 C) (Oral)  Resp 18  Ht 5\' 10"  (1.778 m)  Wt  107.502 kg  BMI 34.01 kg/m2  SpO2 94%   Physical Exam  Constitutional: He is oriented to person, place, and time. He appears well-developed and well-nourished. He appears ill.  Appears weak  HENT:  Head: Normocephalic and atraumatic.  Mouth/Throat: Oropharynx is clear and moist.  Eyes: Conjunctivae and EOM are normal. Pupils are equal, round, and reactive to light.  Neck: Normal range of motion.  Cardiovascular: Normal rate, regular rhythm and normal heart sounds.   Pulmonary/Chest: Effort normal and breath sounds normal.  Abdominal: Soft. Bowel sounds are normal.  Musculoskeletal: Normal range of motion.       Right upper leg: He exhibits swelling.  Right thigh swollen, erythematous, indurated, and warm to touch when compared with left; thigh is tender to palpation Chronic venous stasis of BLE; new wound of right medial lower leg, purulent drainage noted  Neurological: He is alert and oriented to person, place, and time.  Skin: Skin is warm and dry.  Psychiatric: He has a normal mood and affect.  Nursing note and vitals reviewed.    Marland Kitchen new ulceration of lower right medial leg    ^^ right thigh ED Course  Procedures (including critical care time) Labs Review Labs  Reviewed  COMPREHENSIVE METABOLIC PANEL - Abnormal; Notable for the following:    Sodium 128 (*)    Chloride 91 (*)    Glucose, Bld 517 (*)    BUN 25 (*)    Creatinine, Ser 1.54 (*)    Calcium 8.1 (*)    Albumin 3.2 (*)    Total Bilirubin 1.3 (*)    GFR calc non Af Amer 53 (*)    All other components within normal limits  CBC WITH DIFFERENTIAL/PLATELET - Abnormal; Notable for the following:    WBC 16.7 (*)    Neutro Abs 15.8 (*)    Lymphs Abs 0.3 (*)    All other components within normal limits  I-STAT CG4 LACTIC ACID, ED - Abnormal; Notable for the following:    Lactic Acid, Venous 5.40 (*)    All other components within normal limits  CBG MONITORING, ED - Abnormal; Notable for the following:    Glucose-Capillary 403 (*)    All other components within normal limits  CULTURE, BLOOD (ROUTINE X 2)  CULTURE, BLOOD (ROUTINE X 2)  URINE CULTURE  URINALYSIS, ROUTINE W REFLEX MICROSCOPIC (NOT AT [facility])  I-STAT CG4 LACTIC ACID, ED    Imaging Review Ct Femur Right W Contrast  05/06/2015  CLINICAL DATA:  Evaluate for cellulitis. Two days of right lower extremity pain and edema as well as erythema and fever. Tender to touch without crepitus. Evaluate for necrotizing fasciitis. Patient started on Zosyn and vancomycin. EXAM: CT OF THE LOWER RIGHT EXTREMITY WITH CONTRAST TECHNIQUE: Multidetector CT imaging of the right lower extremity from hip to ankle was performed according to the standard protocol following intravenous contrast administration. COMPARISON:  None. CONTRAST:  36mL OMNIPAQUE IOHEXOL 300 MG/ML  SOLN FINDINGS: The visualized pelvis demonstrates moderate bladder distention. There mild degenerate changes of the spine and right hip. Few small lymph nodes along the right external iliac chain. There is moderate right inguinal adenopathy with the largest node measuring 3 x 5.5 cm. There is mild subcutaneous edema throughout the right lower extremity. There is no focal subcutaneous fluid  collection. There are is no air within the soft tissues. Underlying muscle compartments within the upper and lower leg are within normal. No joint effusion within the right hip or knee. Underlying  bony structures are within normal. There is minimal calcified plaque throughout the right femoral artery. There is definite evidence of venous thrombosis. IMPRESSION: Mild to moderate subcutaneous edema throughout the right lower extremity. No evidence of abscess within the subcutaneous tissues or muscle compartment. No air within the soft tissues to suggest necrotizing infection. Moderate right inguinal adenopathy. Findings likely due to cellulitis. Mild degenerative change of the right hip. Electronically Signed   By: Marin Olp M.D.   On: 05/06/2015 21:01   Ct Tibia Fibula Right W Contrast  05/06/2015  CLINICAL DATA:  Evaluate for cellulitis. Two days of right lower extremity pain and edema as well as erythema and fever. Tender to touch without crepitus. Evaluate for necrotizing fasciitis. Patient started on Zosyn and vancomycin. EXAM: CT OF THE LOWER RIGHT EXTREMITY WITH CONTRAST TECHNIQUE: Multidetector CT imaging of the right lower extremity from hip to ankle was performed according to the standard protocol following intravenous contrast administration. COMPARISON:  None. CONTRAST:  45mL OMNIPAQUE IOHEXOL 300 MG/ML  SOLN FINDINGS: The visualized pelvis demonstrates moderate bladder distention. There mild degenerate changes of the spine and right hip. Few small lymph nodes along the right external iliac chain. There is moderate right inguinal adenopathy with the largest node measuring 3 x 5.5 cm. There is mild subcutaneous edema throughout the right lower extremity. There is no focal subcutaneous fluid collection. There are is no air within the soft tissues. Underlying muscle compartments within the upper and lower leg are within normal. No joint effusion within the right hip or knee. Underlying bony structures  are within normal. There is minimal calcified plaque throughout the right femoral artery. There is definite evidence of venous thrombosis. IMPRESSION: Mild to moderate subcutaneous edema throughout the right lower extremity. No evidence of abscess within the subcutaneous tissues or muscle compartment. No air within the soft tissues to suggest necrotizing infection. Moderate right inguinal adenopathy. Findings likely due to cellulitis. Mild degenerative change of the right hip. Electronically Signed   By: Marin Olp M.D.   On: 05/06/2015 21:01   Dg Hip Unilat With Pelvis 2-3 Views Right  05/06/2015  CLINICAL DATA:  Fall with right leg pain.  Initial encounter. EXAM: DG HIP (WITH OR WITHOUT PELVIS) 2-3V RIGHT COMPARISON:  None. FINDINGS: There is no evidence of hip fracture or dislocation. There is no evidence of arthropathy or other focal bone abnormality. Small incidental trochanteric enthesophytes. Tubular calcification in the right hemipelvis, likely vas deferens calcification from patient's diabetes. IMPRESSION: Negative. Electronically Signed   By: Monte Fantasia M.D.   On: 05/06/2015 04:43   Dg Femur, Min 2 Views Right  05/06/2015  CLINICAL DATA:  Fall last week with right leg pain. Initial encounter. EXAM: RIGHT FEMUR 2 VIEWS COMPARISON:  None. FINDINGS: The proximal femur is visualized on contemporaneous hip imaging. There is no evidence of fracture, dislocation, or focal bone lesion. No soft tissue gas or opaque foreign body. IMPRESSION: No acute finding. Electronically Signed   By: Monte Fantasia M.D.   On: 05/06/2015 04:47   I have personally reviewed and evaluated these images and lab results as part of my medical decision-making.   EKG Interpretation None      MDM   Final diagnoses:  Cellulitis of right leg  Sepsis, due to unspecified organism ([place])  Hyperglycemia  Dehydration  Elevated serum creatinine   44 year old male here with fever.  He appears septic from cellulitis  of his right thigh. Venous duplex this morning was negative for DVT.  Code sepsis activated. Patient started on broad-spectrum vancomycin and Zosyn.  He was started on weight based fluid bolus.  Lab work with elevated lactate and leukocytosis. His blood sugar remains uncontrolled as he has been off of his insulin for several months, however is improving with fluids. SrCr also elevated, likely due to dehydration as patient has had little oral intake today.  No evidence of DKA at this time. CT scan of right leg ordered to r/o necrotizing fascitis, negative for acute air or abscess formation in the soft tissues. Patient remains hemodynamically stable.  Patient will be admitted for continued IV antibiotics.  Larene Pickett, PA-C 05/06/15 2231

## 2015-05-06 NOTE — Progress Notes (Signed)
Pre visit review using our clinic review tool, if applicable. No additional management support is needed unless otherwise documented below in the visit note. Pt unable to stand and weigh.   

## 2015-05-07 DIAGNOSIS — N179 Acute kidney failure, unspecified: Secondary | ICD-10-CM | POA: Insufficient documentation

## 2015-05-07 DIAGNOSIS — R739 Hyperglycemia, unspecified: Secondary | ICD-10-CM | POA: Insufficient documentation

## 2015-05-07 LAB — CBC WITH DIFFERENTIAL/PLATELET
BASOS ABS: 0 10*3/uL (ref 0.0–0.1)
BASOS PCT: 0 %
Eosinophils Absolute: 0 10*3/uL (ref 0.0–0.7)
Eosinophils Relative: 0 %
HEMATOCRIT: 33.5 % — AB (ref 39.0–52.0)
HEMOGLOBIN: 11.4 g/dL — AB (ref 13.0–17.0)
LYMPHS PCT: 3 %
Lymphs Abs: 0.5 10*3/uL — ABNORMAL LOW (ref 0.7–4.0)
MCH: 30.7 pg (ref 26.0–34.0)
MCHC: 34 g/dL (ref 30.0–36.0)
MCV: 90.3 fL (ref 78.0–100.0)
MONO ABS: 0.6 10*3/uL (ref 0.1–1.0)
Monocytes Relative: 5 %
NEUTROS ABS: 13 10*3/uL — AB (ref 1.7–7.7)
NEUTROS PCT: 92 %
Platelets: 186 10*3/uL (ref 150–400)
RBC: 3.71 MIL/uL — AB (ref 4.22–5.81)
RDW: 12.9 % (ref 11.5–15.5)
WBC: 14.1 10*3/uL — AB (ref 4.0–10.5)

## 2015-05-07 LAB — URINALYSIS, ROUTINE W REFLEX MICROSCOPIC
BILIRUBIN URINE: NEGATIVE
Glucose, UA: >1000 mg/dL — AB
Ketones, ur: NEGATIVE mg/dL
Leukocytes, UA: NEGATIVE
Nitrite: NEGATIVE
PH: 5 (ref 5.0–8.0)
Protein, ur: >300 mg/dL — AB
SPECIFIC GRAVITY, URINE: 1.031 — AB (ref 1.005–1.030)

## 2015-05-07 LAB — PROTIME-INR
INR: 1.26 (ref 0.00–1.49)
PROTHROMBIN TIME: 15.9 s — AB (ref 11.6–15.2)

## 2015-05-07 LAB — PROCALCITONIN: Procalcitonin: 27.66 ng/mL

## 2015-05-07 LAB — COMPREHENSIVE METABOLIC PANEL
ALK PHOS: 79 U/L (ref 38–126)
ALT: 23 U/L (ref 17–63)
ANION GAP: 8 (ref 5–15)
AST: 25 U/L (ref 15–41)
Albumin: 2.3 g/dL — ABNORMAL LOW (ref 3.5–5.0)
BILIRUBIN TOTAL: 1.3 mg/dL — AB (ref 0.3–1.2)
BUN: 23 mg/dL — ABNORMAL HIGH (ref 6–20)
CALCIUM: 7.1 mg/dL — AB (ref 8.9–10.3)
CO2: 24 mmol/L (ref 22–32)
Chloride: 101 mmol/L (ref 101–111)
Creatinine, Ser: 1.3 mg/dL — ABNORMAL HIGH (ref 0.61–1.24)
Glucose, Bld: 392 mg/dL — ABNORMAL HIGH (ref 65–99)
POTASSIUM: 4.1 mmol/L (ref 3.5–5.1)
Sodium: 133 mmol/L — ABNORMAL LOW (ref 135–145)
TOTAL PROTEIN: 5.4 g/dL — AB (ref 6.5–8.1)

## 2015-05-07 LAB — GLUCOSE, CAPILLARY
GLUCOSE-CAPILLARY: 214 mg/dL — AB (ref 65–99)
Glucose-Capillary: 319 mg/dL — ABNORMAL HIGH (ref 65–99)
Glucose-Capillary: 222 mg/dL — ABNORMAL HIGH (ref 65–99)
Glucose-Capillary: 213 mg/dL — ABNORMAL HIGH (ref 65–99)

## 2015-05-07 LAB — MRSA PCR SCREENING: MRSA BY PCR: NEGATIVE

## 2015-05-07 LAB — URINE MICROSCOPIC-ADD ON

## 2015-05-07 LAB — HIV ANTIBODY (ROUTINE TESTING W REFLEX): HIV Screen 4th Generation wRfx: NONREACTIVE

## 2015-05-07 LAB — LACTIC ACID, PLASMA
LACTIC ACID, VENOUS: 1.8 mmol/L (ref 0.5–2.0)
LACTIC ACID, VENOUS: 4.3 mmol/L — AB (ref 0.5–2.0)
Lactic Acid, Venous: 1.6 mmol/L (ref 0.5–2.0)

## 2015-05-07 LAB — APTT: aPTT: 30 seconds (ref 24–37)

## 2015-05-07 MED ORDER — CLONIDINE HCL 0.1 MG PO TABS
0.1000 mg | ORAL_TABLET | Freq: Two times a day (BID) | ORAL | Status: DC
  Administered 2015-05-07 – 2015-05-09 (×5): 0.1 mg via ORAL
  Filled 2015-05-07 (×6): qty 1

## 2015-05-07 MED ORDER — HYDRALAZINE HCL 20 MG/ML IJ SOLN
5.0000 mg | Freq: Once | INTRAMUSCULAR | Status: AC
  Administered 2015-05-07: 5 mg via INTRAVENOUS
  Filled 2015-05-07: qty 1

## 2015-05-07 MED ORDER — AMLODIPINE BESYLATE 10 MG PO TABS
10.0000 mg | ORAL_TABLET | Freq: Every day | ORAL | Status: DC
  Administered 2015-05-07 – 2015-05-09 (×3): 10 mg via ORAL
  Filled 2015-05-07 (×3): qty 1

## 2015-05-07 MED ORDER — ACETAMINOPHEN 325 MG PO TABS
650.0000 mg | ORAL_TABLET | ORAL | Status: DC | PRN
  Administered 2015-05-07 (×3): 650 mg via ORAL
  Filled 2015-05-07 (×3): qty 2

## 2015-05-07 MED ORDER — VANCOMYCIN HCL IN DEXTROSE 750-5 MG/150ML-% IV SOLN
750.0000 mg | Freq: Two times a day (BID) | INTRAVENOUS | Status: DC
  Administered 2015-05-07 – 2015-05-08 (×4): 750 mg via INTRAVENOUS
  Filled 2015-05-07 (×6): qty 150

## 2015-05-07 MED ORDER — SODIUM CHLORIDE 0.9 % IV SOLN
INTRAVENOUS | Status: DC
  Administered 2015-05-07 – 2015-05-09 (×6): via INTRAVENOUS

## 2015-05-07 MED ORDER — PIPERACILLIN-TAZOBACTAM 3.375 G IVPB
3.3750 g | Freq: Three times a day (TID) | INTRAVENOUS | Status: DC
  Administered 2015-05-07 – 2015-05-09 (×7): 3.375 g via INTRAVENOUS
  Filled 2015-05-07 (×8): qty 50

## 2015-05-07 MED ORDER — CETYLPYRIDINIUM CHLORIDE 0.05 % MT LIQD
7.0000 mL | Freq: Two times a day (BID) | OROMUCOSAL | Status: DC
  Administered 2015-05-07 – 2015-05-09 (×6): 7 mL via OROMUCOSAL

## 2015-05-07 MED ORDER — SODIUM CHLORIDE 0.9 % IV BOLUS (SEPSIS)
500.0000 mL | Freq: Once | INTRAVENOUS | Status: AC
  Administered 2015-05-07: 500 mL via INTRAVENOUS

## 2015-05-07 MED ORDER — CHLORHEXIDINE GLUCONATE 0.12 % MT SOLN
15.0000 mL | Freq: Two times a day (BID) | OROMUCOSAL | Status: DC
  Administered 2015-05-07 – 2015-05-09 (×5): 15 mL via OROMUCOSAL
  Filled 2015-05-07 (×6): qty 15

## 2015-05-07 MED ORDER — HEPARIN SODIUM (PORCINE) 5000 UNIT/ML IJ SOLN
5000.0000 [IU] | Freq: Three times a day (TID) | INTRAMUSCULAR | Status: DC
  Administered 2015-05-07 – 2015-05-09 (×9): 5000 [IU] via SUBCUTANEOUS
  Filled 2015-05-07 (×11): qty 1

## 2015-05-07 MED ORDER — GLUCERNA SHAKE PO LIQD
237.0000 mL | Freq: Three times a day (TID) | ORAL | Status: DC
  Administered 2015-05-07 – 2015-05-08 (×2): 237 mL via ORAL
  Filled 2015-05-07 (×8): qty 237

## 2015-05-07 MED ORDER — INSULIN GLARGINE 100 UNIT/ML ~~LOC~~ SOLN
50.0000 [IU] | Freq: Every day | SUBCUTANEOUS | Status: DC
  Administered 2015-05-07 – 2015-05-08 (×2): 50 [IU] via SUBCUTANEOUS
  Filled 2015-05-07 (×3): qty 0.5

## 2015-05-07 NOTE — Progress Notes (Signed)
CRITICAL VALUE ALERT  Critical value received:  Lactic acid 2.96  Date of notification:  05/07/2015  Time of notification:  [id number]  Critical value read back:Yes.    Nurse who received alert:  Jari Favre  MD notified (1st page):  K Schorr  Time of first page:  0241  MD notified (2nd page):  Time of second page:  Responding MD:  Lamar Blinks  Time MD responded:  [phone]

## 2015-05-07 NOTE — Consult Note (Addendum)
WOC wound consult note Reason for Consult: Venous insufficiency with ulceration at RLE Wound type:Venous insufficiency Pressure Ulcer POA: No Measurement: Pretibial lesion :  1cm x 1cm x 0.2cm (Partial thickness).  Medial Malleolus:  2.5cm x 3.5cm x 0.4cm.  80% soft yellow slough (chronic inflammation), 20% pink tissue Wound bed:As described above Drainage (amount, consistency, odor) serous to light yellow exudate from medial malleolar wound.  None from pretibial wound Periwound: LE with erythema and warmth, resolving edema since LEs have been elevated today.  Patient started on systemic antibiotics. Dressing procedure/placement/frequency: Compression will be an essential element of therapy for the medial malleolar ulcer.  Unna's Boots are initiated today along with topical wound care; Ortho tech will apply Unna's Boots after Bedside RN performs wound care. This will need to be continued weekly and cannot be applied by a lay person.  Patient will require follow up either by an outpatient wound care center or by a [facility].  Please refer to the aftercare setting you and the patient desire. [place] nursing team will not follow, but will remain available to this patient, the nursing and medical teams.  Please re-consult if needed. Thanks, Maudie Flakes, MSN, RN, [place], Arther Abbott  Pager# [phone]

## 2015-05-07 NOTE — Progress Notes (Signed)
Patient is refusing CPAP at this time.

## 2015-05-07 NOTE — Progress Notes (Signed)
[facility] PROGRESS NOTE  BARTHOLOMEW LILJEDAHL NWG:[id number] DOB: Sep 21, 1970 DOA: 05/06/2015 PCP: [name], MD  Assessment/Plan:  1. Severe sepsis secondary to cellulitis/diabetic ulcer - improving, lactic acid normalized -continue IVF at 125cc/hr, monitor urine output, Broad spectrum Abx-Vanc/Zosyn -FU Blood CX -CT tib-fib with contrast without Osteo/abscess -transfer to floor -Wound RN consult  2. Acute kidney injury - Serum creatinine on admission 1.54, up from a baseline of 0.8 - Likely secondary to sepsis, improving, 1.3 now - caution with Vanc dosing per PharmD  3. Hypertension - BP running in 170-180 range -resume amlodipine, add low dose clonidine  4. Insulin-dependent diabetes mellitus with retinopathy - Last A1c, 11.6% on 07/18/2014 - Has been off of insulin since losing health insurance coverage more than 1 month ago - FU hbaic, Continue lantus, increase dose -DM coordinator consult, CM consult for med assistance  5. Hyponatremia, hypochloremia - due to Sepsis, volume depletion - improving  DVT ppx: SQ Lovenox   Code Status: Full code Family Communication: wife at bed side Disposition Plan: Tx to floor  HPI/Subjective: Feeling better, mind clearer per wife  Objective: Filed Vitals:   05/07/15 0900 05/07/15 0926  BP:  168/79  Pulse: 107 108  Temp:  100.2 F (37.9 C)  Resp: 17 18    Intake/Output Summary (Last 24 hours) at 05/07/15 1053 Last data filed at 05/07/15 0900  Gross per 24 hour  Intake 2675.41 ml  Output    325 ml  Net 2350.41 ml   Filed Weights   05/06/15 1802 05/06/15 2241 05/07/15 0926  Weight: 107.502 kg (237 lb) 117.2 kg (258 lb 6.1 oz) 117.4 kg (258 lb 13.1 oz)    Exam:   General:  AAOx3  Cardiovascular: S1S2/RRR  Respiratory: CTAB  Abdomen: soft, NT, BS present  Musculoskeletal: R leg with erythema, warmth, swelling, tenderness, lateral aspect of R leg with quarter sized ulcer with purulence  Data  Reviewed: Basic Metabolic Panel:  Recent Labs Lab 05/06/15 0343 05/06/15 1833 05/07/15 0330  NA 132* 128* 133*  K 4.3 4.6 4.1  CL 95* 91* 101  CO2 28 23 24   GLUCOSE 431* 517* 392*  BUN 19 25* 23*  CREATININE 1.13 1.54* 1.30*  CALCIUM 8.4* 8.1* 7.1*   Liver Function Tests:  Recent Labs Lab 05/06/15 1833 05/07/15 0330  AST 34 25  ALT 26 23  ALKPHOS 105 79  BILITOT 1.3* 1.3*  PROT 6.8 5.4*  ALBUMIN 3.2* 2.3*   No results for input(s): LIPASE, AMYLASE in the last 168 hours. No results for input(s): AMMONIA in the last 168 hours. CBC:  Recent Labs Lab 05/06/15 0343 05/06/15 1833 05/07/15 0330  WBC 15.6* 16.7* 14.1*  NEUTROABS 13.8* 15.8* 13.0*  HGB 13.2 13.5 11.4*  HCT 38.3* 39.2 33.5*  MCV 90.5 90.3 90.3  PLT 217 221 186   Cardiac Enzymes: No results for input(s): CKTOTAL, CKMB, CKMBINDEX, TROPONINI in the last 168 hours. BNP (last 3 results) No results for input(s): BNP in the last 8760 hours.  ProBNP (last 3 results) No results for input(s): PROBNP in the last 8760 hours.  CBG:  Recent Labs Lab 05/06/15 2044 05/07/15 0844  GLUCAP 403* 319*    Recent Results (from the past 240 hour(s))  Blood culture (routine x 2)     Status: None (Preliminary result)   Collection Time: 05/06/15  6:33 PM  Result Value Ref Range Status   Specimen Description   Final    BLOOD RIGHT FOREARM Performed at [facility]  Hospital    Special Requests BOTTLES DRAWN AEROBIC AND ANAEROBIC  Final   Culture PENDING  Incomplete   Report Status PENDING  Incomplete  MRSA PCR Screening     Status: None   Collection Time: 05/07/15 12:11 AM  Result Value Ref Range Status   MRSA by PCR NEGATIVE NEGATIVE Final    Comment:        The GeneXpert MRSA Assay (FDA approved for NASAL specimens only), is one component of a comprehensive MRSA colonization surveillance program. It is not intended to diagnose MRSA infection nor to guide or monitor treatment for MRSA infections.       Studies: Ct Femur Right W Contrast  05/06/2015  CLINICAL DATA:  Evaluate for cellulitis. Two days of right lower extremity pain and edema as well as erythema and fever. Tender to touch without crepitus. Evaluate for necrotizing fasciitis. Patient started on Zosyn and vancomycin. EXAM: CT OF THE LOWER RIGHT EXTREMITY WITH CONTRAST TECHNIQUE: Multidetector CT imaging of the right lower extremity from hip to ankle was performed according to the standard protocol following intravenous contrast administration. COMPARISON:  None. CONTRAST:  80mL OMNIPAQUE IOHEXOL 300 MG/ML  SOLN FINDINGS: The visualized pelvis demonstrates moderate bladder distention. There mild degenerate changes of the spine and right hip. Few small lymph nodes along the right external iliac chain. There is moderate right inguinal adenopathy with the largest node measuring 3 x 5.5 cm. There is mild subcutaneous edema throughout the right lower extremity. There is no focal subcutaneous fluid collection. There are is no air within the soft tissues. Underlying muscle compartments within the upper and lower leg are within normal. No joint effusion within the right hip or knee. Underlying bony structures are within normal. There is minimal calcified plaque throughout the right femoral artery. There is definite evidence of venous thrombosis. IMPRESSION: Mild to moderate subcutaneous edema throughout the right lower extremity. No evidence of abscess within the subcutaneous tissues or muscle compartment. No air within the soft tissues to suggest necrotizing infection. Moderate right inguinal adenopathy. Findings likely due to cellulitis. Mild degenerative change of the right hip. Electronically Signed   By: Elberta Fortis M.D.   On: 05/06/2015 21:01   Ct Tibia Fibula Right W Contrast  05/06/2015  CLINICAL DATA:  Evaluate for cellulitis. Two days of right lower extremity pain and edema as well as erythema and fever. Tender to touch without crepitus.  Evaluate for necrotizing fasciitis. Patient started on Zosyn and vancomycin. EXAM: CT OF THE LOWER RIGHT EXTREMITY WITH CONTRAST TECHNIQUE: Multidetector CT imaging of the right lower extremity from hip to ankle was performed according to the standard protocol following intravenous contrast administration. COMPARISON:  None. CONTRAST:  80mL OMNIPAQUE IOHEXOL 300 MG/ML  SOLN FINDINGS: The visualized pelvis demonstrates moderate bladder distention. There mild degenerate changes of the spine and right hip. Few small lymph nodes along the right external iliac chain. There is moderate right inguinal adenopathy with the largest node measuring 3 x 5.5 cm. There is mild subcutaneous edema throughout the right lower extremity. There is no focal subcutaneous fluid collection. There are is no air within the soft tissues. Underlying muscle compartments within the upper and lower leg are within normal. No joint effusion within the right hip or knee. Underlying bony structures are within normal. There is minimal calcified plaque throughout the right femoral artery. There is definite evidence of venous thrombosis. IMPRESSION: Mild to moderate subcutaneous edema throughout the right lower extremity. No evidence of abscess within the  subcutaneous tissues or muscle compartment. No air within the soft tissues to suggest necrotizing infection. Moderate right inguinal adenopathy. Findings likely due to cellulitis. Mild degenerative change of the right hip. Electronically Signed   By: Elberta Fortis M.D.   On: 05/06/2015 21:01   Dg Hip Unilat With Pelvis 2-3 Views Right  05/06/2015  CLINICAL DATA:  Fall with right leg pain.  Initial encounter. EXAM: DG HIP (WITH OR WITHOUT PELVIS) 2-3V RIGHT COMPARISON:  None. FINDINGS: There is no evidence of hip fracture or dislocation. There is no evidence of arthropathy or other focal bone abnormality. Small incidental trochanteric enthesophytes. Tubular calcification in the right hemipelvis,  likely vas deferens calcification from patient's diabetes. IMPRESSION: Negative. Electronically Signed   By: Marnee Spring M.D.   On: 05/06/2015 04:43   Dg Femur, Min 2 Views Right  05/06/2015  CLINICAL DATA:  Fall last week with right leg pain. Initial encounter. EXAM: RIGHT FEMUR 2 VIEWS COMPARISON:  None. FINDINGS: The proximal femur is visualized on contemporaneous hip imaging. There is no evidence of fracture, dislocation, or focal bone lesion. No soft tissue gas or opaque foreign body. IMPRESSION: No acute finding. Electronically Signed   By: Marnee Spring M.D.   On: 05/06/2015 04:47    Scheduled Meds: . amLODipine  10 mg Oral Daily  . antiseptic oral rinse  7 mL Mouth Rinse q12n4p  . chlorhexidine  15 mL Mouth Rinse BID  . cloNIDine  0.1 mg Oral BID  . heparin subcutaneous  5,000 Units Subcutaneous 3 times per day  . insulin aspart  0-20 Units Subcutaneous TID WC  . insulin aspart  4 Units Subcutaneous TID WC  . insulin glargine  40 Units Subcutaneous QHS  . piperacillin-tazobactam (ZOSYN)  IV  3.375 g Intravenous Q8H  . vancomycin  750 mg Intravenous Q12H   Continuous Infusions: . sodium chloride 125 mL/hr at 05/07/15 0044   Antibiotics Given (last 72 hours)    Date/Time Action Medication Dose Rate   05/07/15 0145 Given   piperacillin-tazobactam (ZOSYN) IVPB 3.375 g 3.375 g 12.5 mL/hr   05/07/15 1049 Given   piperacillin-tazobactam (ZOSYN) IVPB 3.375 g 3.375 g 12.5 mL/hr      Principal Problem:   Severe sepsis (HCC) Active Problems:   Cellulitis of leg, left   Hypertension   Diabetes mellitus (HCC)   Cellulitis of right leg   Dehydration   Hyperglycemia   Acute renal failure (HCC)    Time spent:    [facility]  [facility] Pager [phone]. If 7PM-7AM, please contact night-coverage at [email or web address], password [facility] 05/07/2015, 10:53 AM  LOS: 1 day

## 2015-05-07 NOTE — Care Management Note (Signed)
Case Management Note  Patient Details  Name: Alex Ryan MRN: [id number] Date of Birth: 01-Dec-1970  Subjective/Objective:          Sepsis with cellulitis of the lower leg          Action/Plan:Date: May 07, 2015 Chart reviewed for concurrent status and case management needs. Will continue to follow patient for changes and needs: Velva Harman, RN, BSN, [place]   [phone]   Expected Discharge Date:                  Expected Discharge Plan:  Home/Self Care  In-House Referral:  NA  Discharge planning Services  CM Consult  Post Acute Care Choice:  NA Choice offered to:  NA  DME Arranged:  N/A DME Agency:  NA  HH Arranged:  NA HH Agency:  NA  Status of Service:  In process, will continue to follow  Medicare Important Message Given:    Date Medicare IM Given:    Medicare IM give by:    Date Additional Medicare IM Given:    Additional Medicare Important Message give by:     If discussed at [place] of Stay Meetings, dates discussed:    Additional Comments:  Leeroy Cha, RN 05/07/2015, 9:29 AM

## 2015-05-07 NOTE — Progress Notes (Signed)
Initial Nutrition Assessment  DOCUMENTATION CODES:   Obesity unspecified  INTERVENTION:  -Glucerna Shake po TID, each supplement provides 220 kcal and 10 grams of protein -RD will continue to monitor for needs  NUTRITION DIAGNOSIS:   Impaired nutrient utilization related to social / environmental circumstances as evidenced by per patient/family report.  GOAL:   Patient will meet greater than or equal to 90% of their needs  MONITOR:   PO intake, Supplement acceptance, I & O's, Labs  REASON FOR ASSESSMENT:   Malnutrition Screening Tool   ASSESSMENT:   Alex Ryan is a 44 y.o. male with PMH of hypertension, hypertension, insulin-dependent diabetes, skin/soft tissue infections, and OSA who presents to the ED at the direction of his primary care physician for evaluation of right lower extremity cellulitis with systemic involvement. Alex Ryan has lost his insurance coverage and has gone without insulin and his other medications for more than one month. He has chronic venous stasis with stasis ulcers, and history of infection of those on his left lower extremity  Spoke with pt and mother at bedside. Pt reports that after his wife lost their insurance coverage, he was without insulin - leading to this hospitalization and cellulitis incident.  Pt reports his blood glucose normally runs from 150-215.  Reports good appetite prior to incident, no weight loss. Per chart pt's weight is stable x1 year 3 mos.  Reports some diarrhea during stay - likely related to antibiotics.  Labs: CBGs 141-403, BUN 23, Cr 1.3 Medications reviewed.   Diet Order:  Diet Carb Modified Fluid consistency:: Thin; Room service appropriate?: Yes  Skin:  Wound (see comment) (Diabetic ulcer, unstagable pressure ulcer to right leg.)  Last BM:  05/07/2015  Height:   Ht Readings from Last 1 Encounters:  05/07/15 5\' 10"  (1.778 m)    Weight:   Wt Readings from Last 1 Encounters:  05/07/15 258 lb 13.1 oz  (117.4 kg)    Ideal Body Weight:  75.45 kg  BMI:  Body mass index is 37.14 kg/(m^2).  Estimated Nutritional Needs:   Kcal:  1900-2100 calories  Protein:  60-70 grams  Fluid:  >/= 1.9L  EDUCATION NEEDS:   No education needs identified at this time  Satira Anis. Sevannah Madia, MS, RD LDN After Hours/Weekend Pager [phone]

## 2015-05-08 DIAGNOSIS — R6520 Severe sepsis without septic shock: Secondary | ICD-10-CM

## 2015-05-08 DIAGNOSIS — L03115 Cellulitis of right lower limb: Secondary | ICD-10-CM

## 2015-05-08 DIAGNOSIS — I10 Essential (primary) hypertension: Secondary | ICD-10-CM

## 2015-05-08 DIAGNOSIS — E860 Dehydration: Secondary | ICD-10-CM

## 2015-05-08 DIAGNOSIS — A419 Sepsis, unspecified organism: Principal | ICD-10-CM

## 2015-05-08 LAB — BASIC METABOLIC PANEL
ANION GAP: 9 (ref 5–15)
BUN: 21 mg/dL — ABNORMAL HIGH (ref 6–20)
CALCIUM: 7.2 mg/dL — AB (ref 8.9–10.3)
CO2: 22 mmol/L (ref 22–32)
CREATININE: 1.09 mg/dL (ref 0.61–1.24)
Chloride: 103 mmol/L (ref 101–111)
Glucose, Bld: 218 mg/dL — ABNORMAL HIGH (ref 65–99)
Potassium: 3.5 mmol/L (ref 3.5–5.1)
SODIUM: 134 mmol/L — AB (ref 135–145)

## 2015-05-08 LAB — CBC WITH DIFFERENTIAL/PLATELET
BASOS ABS: 0 10*3/uL (ref 0.0–0.1)
BASOS PCT: 0 %
EOS ABS: 0 10*3/uL (ref 0.0–0.7)
Eosinophils Relative: 0 %
HEMATOCRIT: 32.6 % — AB (ref 39.0–52.0)
Hemoglobin: 11.1 g/dL — ABNORMAL LOW (ref 13.0–17.0)
Lymphocytes Relative: 7 %
Lymphs Abs: 0.8 10*3/uL (ref 0.7–4.0)
MCH: 31 pg (ref 26.0–34.0)
MCHC: 34 g/dL (ref 30.0–36.0)
MCV: 91.1 fL (ref 78.0–100.0)
MONO ABS: 0.9 10*3/uL (ref 0.1–1.0)
MONOS PCT: 8 %
NEUTROS PCT: 85 %
Neutro Abs: 10.7 10*3/uL — ABNORMAL HIGH (ref 1.7–7.7)
PLATELETS: 187 10*3/uL (ref 150–400)
RBC: 3.58 MIL/uL — ABNORMAL LOW (ref 4.22–5.81)
RDW: 12.8 % (ref 11.5–15.5)
WBC: 12.6 10*3/uL — ABNORMAL HIGH (ref 4.0–10.5)

## 2015-05-08 LAB — URINE CULTURE: CULTURE: NO GROWTH

## 2015-05-08 LAB — GLUCOSE, CAPILLARY
GLUCOSE-CAPILLARY: 200 mg/dL — AB (ref 65–99)
GLUCOSE-CAPILLARY: 213 mg/dL — AB (ref 65–99)
GLUCOSE-CAPILLARY: 183 mg/dL — AB (ref 65–99)
Glucose-Capillary: 156 mg/dL — ABNORMAL HIGH (ref 65–99)

## 2015-05-08 LAB — HEMOGLOBIN A1C
Hgb A1c MFr Bld: 12.3 % — ABNORMAL HIGH (ref 4.8–5.6)
Mean Plasma Glucose: 306 mg/dL

## 2015-05-08 MED ORDER — GABAPENTIN 300 MG PO CAPS
300.0000 mg | ORAL_CAPSULE | Freq: Three times a day (TID) | ORAL | Status: DC
  Administered 2015-05-08 – 2015-05-09 (×5): 300 mg via ORAL
  Filled 2015-05-08 (×6): qty 1

## 2015-05-08 MED ORDER — METOPROLOL SUCCINATE ER 100 MG PO TB24
100.0000 mg | ORAL_TABLET | Freq: Every day | ORAL | Status: DC
Start: 2015-05-08 — End: 2015-05-09
  Administered 2015-05-08 – 2015-05-09 (×2): 100 mg via ORAL
  Filled 2015-05-08 (×2): qty 1

## 2015-05-08 NOTE — Evaluation (Signed)
Occupational Therapy Evaluation Patient Details Name: Alex Ryan MRN: [id number] DOB: 07/29/1970 Today's Date: 05/08/2015    History of Present Illness pt was admitted for severe sepsis due to RLE cellulitis/diabetic ulcer   Clinical Impression   This 44 year old man was admitted for the above.  He will benefit from continued OT to increase safety and independence with adls.  He currently needs min to mod A for ADLs. Goals in acute are for set up to mod I level    Follow Up Recommendations  No OT follow up;Supervision/Assistance - 24 hour    Equipment Recommendations  3 in 1 bedside comode    Recommendations for Other Services       Precautions / Restrictions Precautions Precautions: Fall Restrictions Weight Bearing Restrictions: No      Mobility Bed Mobility Overal bed mobility: Modified Independent             General bed mobility comments: extra time; HOB raised  Transfers Overall transfer level: Needs assistance Equipment used: Rolling walker (2 wheeled) Transfers: Sit to/from Stand Sit to Stand: Min guard;Min assist         General transfer comment: depending upon surface height:  min A from comfort height commode.  Cues for UE/LE placement    Balance                                            ADL Overall ADL's : Needs assistance/impaired Eating/Feeding: Set up   Grooming: Oral care;Standing;Minimal assistance (for toothpaste)       Lower Body Bathing: Minimal assistance;Sit to/from stand       Lower Body Dressing: Moderate assistance;Sit to/from stand   Toilet Transfer: Min guard;Minimal assistance;Comfort height toilet;BSC;RW             General ADL Comments: pt is able to perform UB adls with set up.  Cued to rest bil UEs on sink to stabilize when applying toothpaste due to tremor:  still provided min A to apply this.  Pt usually can cross legs for ADLs but unable to at this time.  Issued reacher and long  sponge and cup with lid.  Discussed weightbearing through arms when eating to decrease tremor/decrease spillage.  Pt has been sponge bathing:  he is not ready to step over tub.      Vision     Perception     Praxis      Pertinent Vitals/Pain Pain Assessment: 0-10 Pain Score: 5  Pain Location: RLE Pain Descriptors / Indicators: Aching Pain Intervention(s): Monitored during session;Limited activity within patient's tolerance;Repositioned     Hand Dominance     Extremity/Trunk Assessment Upper Extremity Assessment Upper Extremity Assessment: Overall WFL for tasks assessed (has bil tremor; pt states due to meds)           Communication Communication Communication: No difficulties   Cognition Arousal/Alertness: Awake/alert Behavior During Therapy: WFL for tasks assessed/performed Overall Cognitive Status: Within Functional Limits for tasks assessed                     General Comments       Exercises       Shoulder Instructions      Home Living Family/patient expects to be discharged to:: Private residence Living Arrangements: Spouse/significant other  Bathroom Shower/Tub: Doctor, general practice: None          Prior Functioning/Environment Level of Independence: Independent             OT Diagnosis: Generalized weakness;Acute pain   OT Problem List: Decreased strength;Decreased activity tolerance;Decreased coordination;Pain;Decreased knowledge of use of DME or AE   OT Treatment/Interventions: Self-care/ADL training;DME and/or AE instruction;Patient/family education    OT Goals(Current goals can be found in the care plan section) Acute Rehab OT Goals Patient Stated Goal: get back to baseline OT Goal Formulation: With patient Time For Goal Achievement: 05/15/15 Potential to Achieve Goals: Good ADL Goals Pt Will Transfer to Toilet: with modified independence;ambulating;bedside  commode Additional ADL Goal #1: pt will complete UB  with set up, except for R sock and shoe, with AE, sit to stand   OT Frequency: Min 2X/week   Barriers to D/C:            Co-evaluation              End of Session Nurse Communication:  (NT:  asked about chair alarm:  bed alarm off)  Activity Tolerance: Patient tolerated treatment well Patient left: in chair;with call bell/phone within reach;with family/visitor present   Time: 1427-1450 OT Time Calculation (min): 23 min Charges:  OT General Charges $OT Visit: 1 Procedure OT Evaluation $Initial OT Evaluation Tier I: 1 Procedure G-Codes: OT G-codes **NOT FOR INPATIENT CLASS** Functional Assessment Tool Used: clinical judgment and observation Functional Limitation: Self care Self Care Current Status (Z6109): At least 40 percent but less than 60 percent impaired, limited or restricted Self Care Goal Status (U0454): At least 20 percent but less than 40 percent impaired, limited or restricted  Upton Russey 05/08/2015, 3:07 PM   Marica Otter, OTR/L [phone] 05/08/2015

## 2015-05-08 NOTE — Progress Notes (Signed)
Inpatient Diabetes Program Recommendations  AACE/ADA: New Consensus Statement on Inpatient Glycemic Control (2015)  Target Ranges:  Prepandial:   less than 140 mg/dL      Peak postprandial:   less than 180 mg/dL (1-2 hours)      Critically ill patients:  140 - 180 mg/dL   Review of Glycemic Control  Diabetes history: DM2 Outpatient Diabetes medications: Invokamet 150/1000 mg bid, Lantus 60 units bid, Humalog 45 units at B and D, 20 units at L. (Was not taking these meds d/t cost) Current orders for Inpatient glycemic control: Lantus 50 units QHS, Novolog resistant tidwc + 6 units tidwc,   Results for BECKETT, MADEN (MRN [id number]) as of 05/08/2015 11:43  Ref. Range 05/06/2015 00:15  Hemoglobin A1C Latest Ref Range: 4.8-5.6 % 12.3 (H)  Results for YAPHET, SMETHURST (MRN [id number]) as of 05/08/2015 11:43  Ref. Range 05/07/2015 08:44 05/07/2015 12:03 05/07/2015 18:20 05/07/2015 22:07 05/08/2015 07:27  Glucose-Capillary Latest Ref Range: 65-99 mg/dL 319 (H) 222 (H) 214 (H) 213 (H) 200 (H)  Results for DEVONTAY, CELAYA (MRN [id number]) as of 05/08/2015 11:43  Ref. Range 05/08/2015 05:30  Sodium Latest Ref Range: 135-145 mmol/L 134 (L)  Potassium Latest Ref Range: 3.5-5.1 mmol/L 3.5  Chloride Latest Ref Range: 101-111 mmol/L 103  CO2 Latest Ref Range: 22-32 mmol/L 22  BUN Latest Ref Range: 6-20 mg/dL 21 (H)  Creatinine Latest Ref Range: 0.61-1.24 mg/dL 1.09  Calcium Latest Ref Range: 8.9-10.3 mg/dL 7.2 (L)  EGFR (Non-African Amer.) Latest Ref Range: >60 mL/min >60  EGFR (African American) Latest Ref Range: >60 mL/min >60  Glucose Latest Ref Range: 65-99 mg/dL 218 (H)  Anion gap Latest Ref Range: 5-15  9   Needs daily insulin adjustments.  Inpatient Diabetes Program Recommendations:  Insulin - Basal: Increase Lantus to 55 units QHS Correction (SSI): Add HS correction Insulin - Meal Coverage: Increase Novolog to 10 units tidwc for meal coverage insulin HgbA1C: 12.3% - uncontrolled  Spoke  with pt and wife regarding his diabetes control. Has not been able to afford his meds and insulin. Sees Dr. Dwyane Dee for endo. Last visit 11/08/2014.  Interested in going to [facility]. Will order care management consult for PCP and assistance with meds. Discussed HgbA1C and importance of getting in within normal range.  Has had DM education in the past and pt states he knows what to do, he just needs to do it. Has glucose meter at home and checks blood sugars regularly.  If pt cannot get meds at [facility], please order affordable meds such as Humulin 70/30 or NPH and Regular insulin which can be purchased for $24.99 at [facility]. RN - please have pt give his insulin injections while inpatient, in order to assess technique.  Will continue to follow. Thank you. Lorenda Peck, RD, LDN, CDE Inpatient Diabetes Coordinator [phone]

## 2015-05-08 NOTE — Evaluation (Signed)
Physical Therapy Evaluation Patient Details Name: Alex Ryan MRN: [id number] DOB: 1971/02/09 Today's Date: 05/08/2015   History of Present Illness  pt was admitted for severe sepsis due to RLE cellulitis/diabetic ulcer  Clinical Impression  Patient very motivated to ambulate. Patient will benefit from PT to address problems listed in note below to DC to home.    Follow Up Recommendations No PT follow up    Equipment Recommendations  Rolling walker with 5" wheels (unless he borrows his mom's)    Recommendations for Other Services       Precautions / Restrictions Precautions Precautions: Fall Restrictions Weight Bearing Restrictions: No      Mobility  Bed Mobility Overal bed mobility: Modified Independent             General bed mobility comments: extra time; HOB raised, extra time to lift R leg  Transfers Overall transfer level: Needs assistance Equipment used: Rolling walker (2 wheeled) Transfers: Sit to/from Stand Sit to Stand: Min assist         General transfer comment: rocking momentum to stand from recliner, cues for hands.  Ambulation/Gait Ambulation/Gait assistance: Supervision Ambulation Distance (Feet): 200 Feet Assistive device: Rolling walker (2 wheeled) Gait Pattern/deviations: Step-through pattern;Wide base of support;Decreased stride length;Antalgic Gait velocity: slow   General Gait Details: slow, steady gait,  Stairs            Wheelchair Mobility    Modified Rankin (Stroke Patients Only)       Balance Overall balance assessment: Needs assistance   Sitting balance-Leahy Scale: Normal     Standing balance support: No upper extremity supported;During functional activity Standing balance-Leahy Scale: Fair                               Pertinent Vitals/Pain Pain Assessment: 0-10 Pain Score: 2  Pain Location: RLE Pain Descriptors / Indicators: Discomfort;Tightness Pain Intervention(s): Monitored during  session;Premedicated before session;Repositioned    Home Living Family/patient expects to be discharged to:: Other (Comment) (extended stay, hotel) Living Arrangements: Spouse/significant other             Home Equipment: None Additional Comments: will try to borrow  his mother's RW    Prior Function Level of Independence: Independent               Hand Dominance        Extremity/Trunk Assessment   Upper Extremity Assessment: Defer to OT evaluation           Lower Extremity Assessment: Generalized weakness      Cervical / Trunk Assessment: Normal  Communication   Communication: No difficulties  Cognition Arousal/Alertness: Awake/alert Behavior During Therapy: WFL for tasks assessed/performed Overall Cognitive Status: Within Functional Limits for tasks assessed                      General Comments      Exercises        Assessment/Plan    PT Assessment Patient needs continued PT services  PT Diagnosis Generalized weakness;Acute pain;Difficulty walking   PT Problem List Decreased strength;Decreased activity tolerance;Decreased range of motion;Decreased mobility;Obesity;Pain;Decreased knowledge of precautions;Decreased knowledge of use of DME  PT Treatment Interventions DME instruction;Gait training;Functional mobility training;Therapeutic activities;Patient/family education   PT Goals (Current goals can be found in the Care Plan section) Acute Rehab PT Goals Patient Stated Goal: get back to baseline PT Goal Formulation: With patient/family Time For Goal Achievement:  05/22/15 Potential to Achieve Goals: Good    Frequency Min 3X/week   Barriers to discharge        Co-evaluation               End of Session   Activity Tolerance: Patient tolerated treatment well Patient left: in bed;with call bell/phone within reach;with family/visitor present Nurse Communication: Mobility status         Time: [id number] PT Time  Calculation (min) (ACUTE ONLY): 22 min   Charges:   PT Evaluation $Initial PT Evaluation Tier I: 1 Procedure     PT G CodesClaretha Cooper 05/08/2015, 4:39 PM Tresa Endo PT [phone]

## 2015-05-08 NOTE — Progress Notes (Signed)
Pt refused CPAP qhs.  Education provided to Pt on importance of wearing.  RT will continue to monitor as needed.

## 2015-05-08 NOTE — Progress Notes (Signed)
[facility] PROGRESS NOTE  Alex Ryan [id number] DOB: 16-May-1971 DOA: 05/06/2015 PCP: Laurey Morale, MD  HPI/Brief narrative 44 y.o. male with PMH of hypertension, hypertension, insulin-dependent diabetes, skin/soft tissue infections, and OSA who presents to the ED at the direction of his primary care physician for evaluation of right lower extremity cellulitis with systemic involvement  Assessment/Plan: 1. Severe sepsis secondary to cellulitis/diabetic ulcer - Sepsis resolved, lactic acid normalized -Patient is continued on Broad spectrum Abx-Vanc/Zosyn -CT tib-fib with contrast without Osteo/abscess -Wound RN consult with recommendations for follow-up with wound care as an outpatient  2. Acute kidney injury - Serum creatinine on admission 1.54, up from a baseline of 0.8 - Likely secondary to sepsis, renal function improving - caution with Vanc dosing per PharmD  3. Hypertension -resumed amlodipine, added low dose clonidine -Resume metoprolol  4. Insulin-dependent diabetes mellitus with retinopathy - Last A1c, 11.6% on 07/18/2014 - Has been off of insulin since losing health insurance coverage more than 1 month ago - FU hbaic, Continue lantus, increase dose - DM coordinator consult, CM consult for med assistance  5. Hyponatremia, hypochloremia - due to Sepsis, volume depletion - improving  Code Status: Full Family Communication: Pt in room Disposition Plan: Home when off IV abx   Consultants:    Procedures:    Antibiotics: Anti-infectives    Start     Dose/Rate Route Frequency Ordered Stop   05/07/15 1000  vancomycin (VANCOCIN) IVPB 750 mg/150 ml premix  Status:  Discontinued     750 mg 150 mL/hr over 60 Minutes Intravenous Every 12 hours 05/06/15 1840 05/06/15 2353   05/07/15 1000  vancomycin (VANCOCIN) IVPB 750 mg/150 ml premix     750 mg 150 mL/hr over 60 Minutes Intravenous Every 12 hours 05/07/15 0031     05/07/15 0200   piperacillin-tazobactam (ZOSYN) IVPB 3.375 g  Status:  Discontinued     3.375 g 12.5 mL/hr over 240 Minutes Intravenous Every 8 hours 05/06/15 1840 05/06/15 2353   05/07/15 0200  piperacillin-tazobactam (ZOSYN) IVPB 3.375 g     3.375 g 12.5 mL/hr over 240 Minutes Intravenous Every 8 hours 05/07/15 0031     05/07/15 0000  piperacillin-tazobactam (ZOSYN) IVPB 3.375 g  Status:  Discontinued     3.375 g 100 mL/hr over 30 Minutes Intravenous  Once 05/06/15 2353 05/07/15 0007   05/07/15 0000  vancomycin (VANCOCIN) IVPB 1000 mg/200 mL premix  Status:  Discontinued     1,000 mg 200 mL/hr over 60 Minutes Intravenous  Once 05/06/15 2353 05/07/15 0008   05/06/15 1845  vancomycin (VANCOCIN) 2,000 mg in sodium chloride 0.9 % 500 mL IVPB     2,000 mg 250 mL/hr over 120 Minutes Intravenous STAT 05/06/15 1836 05/06/15 2153   05/06/15 1845  piperacillin-tazobactam (ZOSYN) IVPB 3.375 g     3.375 g 100 mL/hr over 30 Minutes Intravenous STAT 05/06/15 1836 05/06/15 1914      HPI/Subjective: Patient reports feeling "100% times" better today.  Objective: Filed Vitals:   05/07/15 1732 05/07/15 2200 05/08/15 0611 05/08/15 1500  BP:  150/77 147/82 143/66  Pulse:  98 60 85  Temp: 99.6 F (37.6 C) 98.2 F (36.8 C) 99.1 F (37.3 C) 98.1 F (36.7 C)  TempSrc: Oral Oral Oral Oral  Resp:  18 18 18   Height:      Weight:      SpO2:  97% 95% 96%    Intake/Output Summary (Last 24 hours) at 05/08/15 1647 Last data filed at 05/08/15 1412  Gross per 24 hour  Intake   3865 ml  Output      0 ml  Net   3865 ml   Filed Weights   05/06/15 1802 05/06/15 2241 05/07/15 0926  Weight: 107.502 kg (237 lb) 117.2 kg (258 lb 6.1 oz) 117.4 kg (258 lb 13.1 oz)    Exam:   General:  Awake, in nad  Cardiovascular: regular, s1, s2  Respiratory: normal resp effort, no wheezing  Abdomen: soft,nondistended  Musculoskeletal: perfused, RLE with Unna boot in place   Data Reviewed: Basic Metabolic Panel:  Recent  Labs Lab 05/06/15 0343 05/06/15 1833 05/07/15 0330 05/08/15 0530  NA 132* 128* 133* 134*  K 4.3 4.6 4.1 3.5  CL 95* 91* 101 103  CO2 28 23 24 22   GLUCOSE 431* 517* 392* 218*  BUN 19 25* 23* 21*  CREATININE 1.13 1.54* 1.30* 1.09  CALCIUM 8.4* 8.1* 7.1* 7.2*   Liver Function Tests:  Recent Labs Lab 05/06/15 1833 05/07/15 0330  AST 34 25  ALT 26 23  ALKPHOS 105 79  BILITOT 1.3* 1.3*  PROT 6.8 5.4*  ALBUMIN 3.2* 2.3*   No results for input(s): LIPASE, AMYLASE in the last 168 hours. No results for input(s): AMMONIA in the last 168 hours. CBC:  Recent Labs Lab 05/06/15 0343 05/06/15 1833 05/07/15 0330 05/08/15 0530  WBC 15.6* 16.7* 14.1* 12.6*  NEUTROABS 13.8* 15.8* 13.0* 10.7*  HGB 13.2 13.5 11.4* 11.1*  HCT 38.3* 39.2 33.5* 32.6*  MCV 90.5 90.3 90.3 91.1  PLT 217 221 186 187   Cardiac Enzymes: No results for input(s): CKTOTAL, CKMB, CKMBINDEX, TROPONINI in the last 168 hours. BNP (last 3 results) No results for input(s): BNP in the last 8760 hours.  ProBNP (last 3 results) No results for input(s): PROBNP in the last 8760 hours.  CBG:  Recent Labs Lab 05/07/15 1203 05/07/15 1820 05/07/15 2207 05/08/15 0727 05/08/15 1207  GLUCAP 222* 214* 213* 200* 213*    Recent Results (from the past 240 hour(s))  Blood culture (routine x 2)     Status: None (Preliminary result)   Collection Time: 05/06/15  6:33 PM  Result Value Ref Range Status   Specimen Description BLOOD RIGHT FOREARM  Final   Special Requests BOTTLES DRAWN AEROBIC AND ANAEROBIC 5ML  Final   Culture   Final    NO GROWTH 2 DAYS Performed at [facility]    Report Status PENDING  Incomplete  Blood culture (routine x 2)     Status: None (Preliminary result)   Collection Time: 05/06/15  6:33 PM  Result Value Ref Range Status   Specimen Description BLOOD RIGHT WRIST  Final   Special Requests BOTTLES DRAWN AEROBIC AND ANAEROBIC 5ML  Final   Culture   Final    NO GROWTH 2  DAYS Performed at [facility]    Report Status PENDING  Incomplete  MRSA PCR Screening     Status: None   Collection Time: 05/07/15 12:11 AM  Result Value Ref Range Status   MRSA by PCR NEGATIVE NEGATIVE Final    Comment:        The GeneXpert MRSA Assay (FDA approved for NASAL specimens only), is one component of a comprehensive MRSA colonization surveillance program. It is not intended to diagnose MRSA infection nor to guide or monitor treatment for MRSA infections.   Wound culture     Status: None (Preliminary result)   Collection Time: 05/07/15  8:27 AM  Result Value Ref  Range Status   Specimen Description WOUND ANKLE  Final   Special Requests Immunocompromised  Final   Gram Stain   Final    MODERATE WBC PRESENT,BOTH PMN AND MONONUCLEAR NO SQUAMOUS EPITHELIAL CELLS SEEN NO ORGANISMS SEEN Performed at [facility]    Culture   Final    Culture reincubated for better growth Performed at [facility]    Report Status PENDING  Incomplete  Urine culture     Status: None   Collection Time: 05/07/15  9:24 AM  Result Value Ref Range Status   Specimen Description URINE, CLEAN CATCH  Final   Special Requests NONE  Final   Culture   Final    NO GROWTH 1 DAY Performed at [facility] [address] [facility]    Report Status 05/08/2015 FINAL  Final     Studies: Ct Femur Right W Contrast  05/07/2015  ADDENDUM REPORT: 05/07/2015 14:40 ADDENDUM: Note that there is a bulbous appearance to the Achilles tendon approximately 5 cm above the calcaneal insertion with 2 small associated foci of calcification. These findings may be due to chronic Achilles tendinitis/partial tear. Electronically Signed   By: Marin Olp M.D.   On: 05/07/2015 14:40  05/07/2015  CLINICAL DATA:  Evaluate for cellulitis. Two days of right lower extremity pain and edema as well as erythema and fever. Tender to touch without crepitus. Evaluate for necrotizing fasciitis. Patient started on Zosyn and  vancomycin. EXAM: CT OF THE LOWER RIGHT EXTREMITY WITH CONTRAST TECHNIQUE: Multidetector CT imaging of the right lower extremity from hip to ankle was performed according to the standard protocol following intravenous contrast administration. COMPARISON:  None. CONTRAST:  77mL OMNIPAQUE IOHEXOL 300 MG/ML  SOLN FINDINGS: The visualized pelvis demonstrates moderate bladder distention. There mild degenerate changes of the spine and right hip. Few small lymph nodes along the right external iliac chain. There is moderate right inguinal adenopathy with the largest node measuring 3 x 5.5 cm. There is mild subcutaneous edema throughout the right lower extremity. There is no focal subcutaneous fluid collection. There are is no air within the soft tissues. Underlying muscle compartments within the upper and lower leg are within normal. No joint effusion within the right hip or knee. Underlying bony structures are within normal. There is minimal calcified plaque throughout the right femoral artery. There is definite evidence of venous thrombosis. IMPRESSION: Mild to moderate subcutaneous edema throughout the right lower extremity. No evidence of abscess within the subcutaneous tissues or muscle compartment. No air within the soft tissues to suggest necrotizing infection. Moderate right inguinal adenopathy. Findings likely due to cellulitis. Mild degenerative change of the right hip. Electronically Signed: By: Marin Olp M.D. On: 05/06/2015 21:01   Ct Tibia Fibula Right W Contrast  05/07/2015  ADDENDUM REPORT: 05/07/2015 14:40 ADDENDUM: Note that there is a bulbous appearance to the Achilles tendon approximately 5 cm above the calcaneal insertion with 2 small associated foci of calcification. These findings may be due to chronic Achilles tendinitis/partial tear. Electronically Signed   By: Marin Olp M.D.   On: 05/07/2015 14:40  05/07/2015  CLINICAL DATA:  Evaluate for cellulitis. Two days of right lower extremity  pain and edema as well as erythema and fever. Tender to touch without crepitus. Evaluate for necrotizing fasciitis. Patient started on Zosyn and vancomycin. EXAM: CT OF THE LOWER RIGHT EXTREMITY WITH CONTRAST TECHNIQUE: Multidetector CT imaging of the right lower extremity from hip to ankle was performed according to the standard protocol following intravenous contrast  administration. COMPARISON:  None. CONTRAST:  94mL OMNIPAQUE IOHEXOL 300 MG/ML  SOLN FINDINGS: The visualized pelvis demonstrates moderate bladder distention. There mild degenerate changes of the spine and right hip. Few small lymph nodes along the right external iliac chain. There is moderate right inguinal adenopathy with the largest node measuring 3 x 5.5 cm. There is mild subcutaneous edema throughout the right lower extremity. There is no focal subcutaneous fluid collection. There are is no air within the soft tissues. Underlying muscle compartments within the upper and lower leg are within normal. No joint effusion within the right hip or knee. Underlying bony structures are within normal. There is minimal calcified plaque throughout the right femoral artery. There is definite evidence of venous thrombosis. IMPRESSION: Mild to moderate subcutaneous edema throughout the right lower extremity. No evidence of abscess within the subcutaneous tissues or muscle compartment. No air within the soft tissues to suggest necrotizing infection. Moderate right inguinal adenopathy. Findings likely due to cellulitis. Mild degenerative change of the right hip. Electronically Signed: By: Marin Olp M.D. On: 05/06/2015 21:01    Scheduled Meds: . amLODipine  10 mg Oral Daily  . antiseptic oral rinse  7 mL Mouth Rinse q12n4p  . chlorhexidine  15 mL Mouth Rinse BID  . cloNIDine  0.1 mg Oral BID  . feeding supplement (GLUCERNA SHAKE)  237 mL Oral TID BM  . gabapentin  300 mg Oral TID  . heparin subcutaneous  5,000 Units Subcutaneous 3 times per day  .  insulin aspart  0-20 Units Subcutaneous TID WC  . insulin aspart  4 Units Subcutaneous TID WC  . insulin glargine  50 Units Subcutaneous QHS  . metoprolol succinate  100 mg Oral Daily  . piperacillin-tazobactam (ZOSYN)  IV  3.375 g Intravenous Q8H  . vancomycin  750 mg Intravenous Q12H   Continuous Infusions: . sodium chloride 125 mL/hr at 05/08/15 [id number]    Principal Problem:   Severe sepsis [facility]) Active Problems:   Cellulitis of leg, left   Hypertension   Diabetes mellitus ([place])   Cellulitis of right leg   Dehydration   Hyperglycemia   Acute renal failure ([place])    [facility], [facility] Pager [phone]. If 7PM-7AM, please contact night-coverage at [email or web address], password [facility] 05/08/2015, 4:47 PM  LOS: 2 days

## 2015-05-09 LAB — BASIC METABOLIC PANEL
Anion gap: 9 (ref 5–15)
BUN: 29 mg/dL — AB (ref 6–20)
CALCIUM: 7.1 mg/dL — AB (ref 8.9–10.3)
CHLORIDE: 102 mmol/L (ref 101–111)
CO2: 24 mmol/L (ref 22–32)
CREATININE: 1.36 mg/dL — AB (ref 0.61–1.24)
Glucose, Bld: 139 mg/dL — ABNORMAL HIGH (ref 65–99)
Potassium: 3.3 mmol/L — ABNORMAL LOW (ref 3.5–5.1)
SODIUM: 135 mmol/L (ref 135–145)

## 2015-05-09 LAB — CBC WITH DIFFERENTIAL/PLATELET
BASOS PCT: 0 %
Basophils Absolute: 0 10*3/uL (ref 0.0–0.1)
EOS ABS: 0.1 10*3/uL (ref 0.0–0.7)
EOS PCT: 1 %
HCT: 32 % — ABNORMAL LOW (ref 39.0–52.0)
HEMOGLOBIN: 10.8 g/dL — AB (ref 13.0–17.0)
Lymphocytes Relative: 11 %
Lymphs Abs: 1.3 10*3/uL (ref 0.7–4.0)
MCH: 30.4 pg (ref 26.0–34.0)
MCHC: 33.8 g/dL (ref 30.0–36.0)
MCV: 90.1 fL (ref 78.0–100.0)
MONOS PCT: 11 %
Monocytes Absolute: 1.4 10*3/uL — ABNORMAL HIGH (ref 0.1–1.0)
NEUTROS PCT: 77 %
Neutro Abs: 9.7 10*3/uL — ABNORMAL HIGH (ref 1.7–7.7)
PLATELETS: 182 10*3/uL (ref 150–400)
RBC: 3.55 MIL/uL — ABNORMAL LOW (ref 4.22–5.81)
RDW: 12.9 % (ref 11.5–15.5)
WBC: 12.5 10*3/uL — AB (ref 4.0–10.5)

## 2015-05-09 LAB — GLUCOSE, CAPILLARY
GLUCOSE-CAPILLARY: 122 mg/dL — AB (ref 65–99)
GLUCOSE-CAPILLARY: 148 mg/dL — AB (ref 65–99)

## 2015-05-09 MED ORDER — CLONIDINE HCL 0.1 MG PO TABS: 0.1000 mg | ORAL_TABLET | Freq: Two times a day (BID) | ORAL | Status: DC

## 2015-05-09 MED ORDER — INSULIN REGULAR HUMAN 100 UNIT/ML IJ SOLN: 4.0000 [IU] | Freq: Three times a day (TID) | INTRAMUSCULAR | Status: DC

## 2015-05-09 MED ORDER — AMOXICILLIN-POT CLAVULANATE 875-125 MG PO TABS: 1.0000 | ORAL_TABLET | Freq: Two times a day (BID) | ORAL | Status: DC

## 2015-05-09 MED ORDER — AMLODIPINE BESYLATE 10 MG PO TABS: 10.0000 mg | ORAL_TABLET | Freq: Every day | ORAL | Status: DC

## 2015-05-09 MED ORDER — AMOXICILLIN-POT CLAVULANATE 875-125 MG PO TABS
1.0000 | ORAL_TABLET | Freq: Two times a day (BID) | ORAL | Status: DC
  Administered 2015-05-09: 1 via ORAL
  Filled 2015-05-09 (×2): qty 1

## 2015-05-09 MED ORDER — INSULIN NPH (HUMAN) (ISOPHANE) 100 UNIT/ML ~~LOC~~ SUSP: 25.0000 [IU] | Freq: Two times a day (BID) | SUBCUTANEOUS | Status: DC

## 2015-05-09 MED ORDER — FUROSEMIDE 40 MG PO TABS: ORAL_TABLET | ORAL | Status: DC

## 2015-05-09 NOTE — Progress Notes (Signed)
Pt VSS. Tele d/c'd. D/C summary reviewed at bedside w/ family. Printed prescriptions provided. Wound care addressed. Home health discussed. DME 3 in 1 provided. F/u w/ PCP discussed. Pt verbalized understanding. No further questions.

## 2015-05-09 NOTE — Progress Notes (Signed)
PT Cancellation Note  Patient Details Name: Alex Ryan MRN: [id number] DOB: Nov 18, 1970   Patient reports ambulating with RW today, has his mother's for DC. No further PT needs. Encouraged to walk and  elevate R leg. PT to sign off.)   Marcelino Freestone PT [id number]  05/09/2015, 10:08 AM

## 2015-05-09 NOTE — Discharge Summary (Addendum)
Physician Discharge Summary  OREOLUWA VANDERWEIDE [id number] DOB: 1971/05/15 DOA: 05/06/2015  PCP: Laurey Morale, MD  Admit date: 05/06/2015 Discharge date: 05/09/2015  Time spent: 20 minutes  Recommendations for Outpatient Follow-up:  1. Establish and follow up with PCP - [facility] 2. Follow up with [facility] for wound care 3. Wound Care Instructions: Wound care to RLE: Cleanse with NS, pat gently dry. Cover pretibial wound with soft silicone foam (5x5) and medial malleolus wound with small piece of calcium alginate dressing (over wound only, Kellie Simmering [phone]) and top with silicone foam dressing (5x5s). Rolena Infante to be continued weekly to be applied by [facility]  Discharge Diagnoses:  Principal Problem:   Severe sepsis [facility]) Active Problems:   Cellulitis of leg, left   Hypertension   Diabetes mellitus ([place])   Cellulitis of right leg   Dehydration   Hyperglycemia   Acute renal failure [facility])   Discharge Condition: Improved  Diet recommendation: Diabetic  Filed Weights   05/06/15 1802 05/06/15 2241 05/07/15 0926  Weight: 107.502 kg (237 lb) 117.2 kg (258 lb 6.1 oz) 117.4 kg (258 lb 13.1 oz)    History of present illness:  Please review dictated H and P from 12/19 for details. Briefly, 44 y.o. male with PMH of hypertension, hypertension, insulin-dependent diabetes, skin/soft tissue infections, and OSA who presents to the ED at the direction of his primary care physician for evaluation of right lower extremity cellulitis with systemic involvement  Hospital Course:  1. Severe sepsis secondary to cellulitis/diabetic ulcer - Sepsis resolved, lactic acid normalized -Patient had initially been continued on Broad spectrum Abx-Vanc/Zosyn -Patient clinically improved those antibiotics were transitioned to oral Augmentin to complete the course of antibiotics -CT tib-fib with contrast without Osteo/abscess -Wound RN consult with wound care recommendations noted. Also recommendations  for outpatient wound care. Home health RN has since been set up for the patient.  2. Acute kidney injury likely secondary to dehydration, pre-renal - Serum creatinine on admission 1.54, up from a baseline of 0.8 - Likely secondary to sepsis, renal function improving since admission  3. Hypertension -resumed amlodipine, added low dose clonidine -Resume metoprolol  4. Insulin-dependent diabetes mellitus with retinopathy - Last A1c, 11.6% on 07/18/2014 - Has been off of insulin since losing health insurance coverage more than 1 month ago - FU hbaic, Continue lantus, increase dose - Patient will be discharged on NPH insulin as well as regular insulin before meals given patient's financial difficulties  5. Hyponatremia, hypochloremia - due to Sepsis, volume depletion - Improved  6. Hypokalemia - Replaced  Discharge Exam: Filed Vitals:   05/08/15 1500 05/08/15 2119 05/09/15 0539 05/09/15 1403  BP: 143/66 141/75 131/69 144/76  Pulse: 85 81 78 73  Temp: 98.1 F (36.7 C) 99.4 F (37.4 C) 96.5 F (35.8 C) 98.9 F (37.2 C)  TempSrc: Oral Oral Axillary Oral  Resp: 18 17 17 18   Height:      Weight:      SpO2: 96% 97% 98% 95%    General: Awake, in nad Cardiovascular: regular, s1, s2 Respiratory: normal resp effort, no wheezing  Discharge Instructions     Medication List    STOP taking these medications        Canagliflozin-Metformin HCl [phone] MG Tabs  Commonly known as:  INVOKAMET     cephALEXin 500 MG capsule  Commonly known as:  KEFLEX     diazepam 5 MG tablet  Commonly known as:  VALIUM     Insulin Glargine 300  UNIT/ML Sopn  Commonly known as:  TOUJEO SOLOSTAR     insulin lispro 100 UNIT/ML injection  Commonly known as:  HUMALOG     losartan 50 MG tablet  Commonly known as:  COZAAR     ondansetron 4 MG tablet  Commonly known as:  ZOFRAN     oxyCODONE-acetaminophen 5-325 MG tablet  Commonly known as:  PERCOCET     pravastatin 20 MG tablet  Commonly  known as:  PRAVACHOL     Testosterone 30 MG/ACT Soln  Commonly known as:  AXIRON      TAKE these medications        albuterol 108 (90 BASE) MCG/ACT inhaler  Commonly known as:  PROVENTIL HFA;VENTOLIN HFA  Inhale 2 puffs into the lungs every 4 (four) hours as needed for wheezing or shortness of breath. PRN:  Allergies/breathing     Alcohol Swabs Pads  Use up to 10 times per day for testing and insulin injections, diagnosis code is 250.00     amLODipine 10 MG tablet  Commonly known as:  NORVASC  Take 1 tablet (10 mg total) by mouth daily.     amoxicillin-clavulanate 875-125 MG tablet  Commonly known as:  AUGMENTIN  Take 1 tablet by mouth every 12 (twelve) hours.     aspirin 325 MG tablet  Take 325 mg by mouth daily.     cetirizine 10 MG tablet  Commonly known as:  ZYRTEC  Take 1 tablet (10 mg total) by mouth daily.     cloNIDine 0.1 MG tablet  Commonly known as:  CATAPRES  Take 1 tablet (0.1 mg total) by mouth 2 (two) times daily.     FLUoxetine 40 MG capsule  Commonly known as:  PROZAC  Take 1 capsule (40 mg total) by mouth daily.     FLUoxetine 40 MG capsule  Commonly known as:  PROZAC  TAKE 1 CAPSULE BY MOUTH ONCE DAILY     furosemide 40 MG tablet  Commonly known as:  LASIX  TAKE 1 TABLET BY MOUTH TWICE DAILY AS NEEDED FOR FLUID RETENTION     gabapentin 300 MG capsule  Commonly known as:  NEURONTIN  Take 1 capsule (300 mg total) by mouth 3 (three) times daily.     insulin NPH Human 100 UNIT/ML injection  Commonly known as:  HUMULIN N  Inject 0.25 mLs (25 Units total) into the skin 2 (two) times daily before a meal.     insulin regular 100 units/mL injection  Commonly known as:  HUMULIN R  Inject 0.04 mLs (4 Units total) into the skin 3 (three) times daily before meals.     metoprolol succinate 100 MG 24 hr tablet  Commonly known as:  TOPROL-XL  TAKE 1 TABLET BY MOUTH ONCE DAILY     Needles & Syringes Misc  Inject 2 ml intramuscular every 28 days.        Allergies  Allergen Reactions  . Codeine Hives    Hyper  . Erythromycin Nausea And Vomiting  . Other     Pre-op cleaning wipes:. itching  . Soap Itching    "dial soap"   Follow-up Information    Schedule an appointment as soon as possible for a visit with Laurey Morale, MD.   Specialty:  Family Medicine   Why:  Hospital follow up   Contact information:   [place] [address] [phone]        The results of significant diagnostics from this hospitalization (including  imaging, microbiology, ancillary and laboratory) are listed below for reference.    Significant Diagnostic Studies: Ct Femur Right W Contrast  05/07/2015  ADDENDUM REPORT: 05/07/2015 14:40 ADDENDUM: Note that there is a bulbous appearance to the Achilles tendon approximately 5 cm above the calcaneal insertion with 2 small associated foci of calcification. These findings may be due to chronic Achilles tendinitis/partial tear. Electronically Signed   By: Marin Olp M.D.   On: 05/07/2015 14:40  05/07/2015  CLINICAL DATA:  Evaluate for cellulitis. Two days of right lower extremity pain and edema as well as erythema and fever. Tender to touch without crepitus. Evaluate for necrotizing fasciitis. Patient started on Zosyn and vancomycin. EXAM: CT OF THE LOWER RIGHT EXTREMITY WITH CONTRAST TECHNIQUE: Multidetector CT imaging of the right lower extremity from hip to ankle was performed according to the standard protocol following intravenous contrast administration. COMPARISON:  None. CONTRAST:  93mL OMNIPAQUE IOHEXOL 300 MG/ML  SOLN FINDINGS: The visualized pelvis demonstrates moderate bladder distention. There mild degenerate changes of the spine and right hip. Few small lymph nodes along the right external iliac chain. There is moderate right inguinal adenopathy with the largest node measuring 3 x 5.5 cm. There is mild subcutaneous edema throughout the right lower extremity. There is no focal  subcutaneous fluid collection. There are is no air within the soft tissues. Underlying muscle compartments within the upper and lower leg are within normal. No joint effusion within the right hip or knee. Underlying bony structures are within normal. There is minimal calcified plaque throughout the right femoral artery. There is definite evidence of venous thrombosis. IMPRESSION: Mild to moderate subcutaneous edema throughout the right lower extremity. No evidence of abscess within the subcutaneous tissues or muscle compartment. No air within the soft tissues to suggest necrotizing infection. Moderate right inguinal adenopathy. Findings likely due to cellulitis. Mild degenerative change of the right hip. Electronically Signed: By: Marin Olp M.D. On: 05/06/2015 21:01   Ct Tibia Fibula Right W Contrast  05/07/2015  ADDENDUM REPORT: 05/07/2015 14:40 ADDENDUM: Note that there is a bulbous appearance to the Achilles tendon approximately 5 cm above the calcaneal insertion with 2 small associated foci of calcification. These findings may be due to chronic Achilles tendinitis/partial tear. Electronically Signed   By: Marin Olp M.D.   On: 05/07/2015 14:40  05/07/2015  CLINICAL DATA:  Evaluate for cellulitis. Two days of right lower extremity pain and edema as well as erythema and fever. Tender to touch without crepitus. Evaluate for necrotizing fasciitis. Patient started on Zosyn and vancomycin. EXAM: CT OF THE LOWER RIGHT EXTREMITY WITH CONTRAST TECHNIQUE: Multidetector CT imaging of the right lower extremity from hip to ankle was performed according to the standard protocol following intravenous contrast administration. COMPARISON:  None. CONTRAST:  20mL OMNIPAQUE IOHEXOL 300 MG/ML  SOLN FINDINGS: The visualized pelvis demonstrates moderate bladder distention. There mild degenerate changes of the spine and right hip. Few small lymph nodes along the right external iliac chain. There is moderate right inguinal  adenopathy with the largest node measuring 3 x 5.5 cm. There is mild subcutaneous edema throughout the right lower extremity. There is no focal subcutaneous fluid collection. There are is no air within the soft tissues. Underlying muscle compartments within the upper and lower leg are within normal. No joint effusion within the right hip or knee. Underlying bony structures are within normal. There is minimal calcified plaque throughout the right femoral artery. There is definite evidence of venous thrombosis. IMPRESSION: Mild to  moderate subcutaneous edema throughout the right lower extremity. No evidence of abscess within the subcutaneous tissues or muscle compartment. No air within the soft tissues to suggest necrotizing infection. Moderate right inguinal adenopathy. Findings likely due to cellulitis. Mild degenerative change of the right hip. Electronically Signed: By: Marin Olp M.D. On: 05/06/2015 21:01   Dg Hip Unilat With Pelvis 2-3 Views Right  05/06/2015  CLINICAL DATA:  Fall with right leg pain.  Initial encounter. EXAM: DG HIP (WITH OR WITHOUT PELVIS) 2-3V RIGHT COMPARISON:  None. FINDINGS: There is no evidence of hip fracture or dislocation. There is no evidence of arthropathy or other focal bone abnormality. Small incidental trochanteric enthesophytes. Tubular calcification in the right hemipelvis, likely vas deferens calcification from patient's diabetes. IMPRESSION: Negative. Electronically Signed   By: Monte Fantasia M.D.   On: 05/06/2015 04:43   Dg Femur, Min 2 Views Right  05/06/2015  CLINICAL DATA:  Fall last week with right leg pain. Initial encounter. EXAM: RIGHT FEMUR 2 VIEWS COMPARISON:  None. FINDINGS: The proximal femur is visualized on contemporaneous hip imaging. There is no evidence of fracture, dislocation, or focal bone lesion. No soft tissue gas or opaque foreign body. IMPRESSION: No acute finding. Electronically Signed   By: Monte Fantasia M.D.   On: 05/06/2015 04:47     Microbiology: Recent Results (from the past 240 hour(s))  Blood culture (routine x 2)     Status: None (Preliminary result)   Collection Time: 05/06/15  6:33 PM  Result Value Ref Range Status   Specimen Description BLOOD RIGHT FOREARM  Final   Special Requests BOTTLES DRAWN AEROBIC AND ANAEROBIC 5ML  Final   Culture   Final    NO GROWTH 3 DAYS Performed at [facility]    Report Status PENDING  Incomplete  Blood culture (routine x 2)     Status: None (Preliminary result)   Collection Time: 05/06/15  6:33 PM  Result Value Ref Range Status   Specimen Description BLOOD RIGHT WRIST  Final   Special Requests BOTTLES DRAWN AEROBIC AND ANAEROBIC 5ML  Final   Culture   Final    NO GROWTH 3 DAYS Performed at [facility]    Report Status PENDING  Incomplete  MRSA PCR Screening     Status: None   Collection Time: 05/07/15 12:11 AM  Result Value Ref Range Status   MRSA by PCR NEGATIVE NEGATIVE Final    Comment:        The GeneXpert MRSA Assay (FDA approved for NASAL specimens only), is one component of a comprehensive MRSA colonization surveillance program. It is not intended to diagnose MRSA infection nor to guide or monitor treatment for MRSA infections.   Wound culture     Status: None (Preliminary result)   Collection Time: 05/07/15  8:27 AM  Result Value Ref Range Status   Specimen Description WOUND ANKLE  Final   Special Requests Immunocompromised  Final   Gram Stain   Final    MODERATE WBC PRESENT,BOTH PMN AND MONONUCLEAR NO SQUAMOUS EPITHELIAL CELLS SEEN NO ORGANISMS SEEN Performed at [facility]    Culture   Final    ABUNDANT STAPHYLOCOCCUS AUREUS Note: RIFAMPIN AND GENTAMICIN SHOULD NOT BE USED AS SINGLE DRUGS FOR TREATMENT OF STAPH INFECTIONS. Performed at [facility]    Report Status PENDING  Incomplete  Urine culture     Status: None   Collection Time: 05/07/15  9:24 AM  Result Value Ref Range Status  Specimen  Description URINE, CLEAN CATCH  Final   Special Requests NONE  Final   Culture   Final    NO GROWTH 1 DAY Performed at [facility]    Report Status 05/08/2015 FINAL  Final     Labs: Basic Metabolic Panel:  Recent Labs Lab 05/06/15 0343 05/06/15 1833 05/07/15 0330 05/08/15 0530 05/09/15 0445  NA 132* 128* 133* 134* 135  K 4.3 4.6 4.1 3.5 3.3*  CL 95* 91* 101 103 102  CO2 28 23 24 22 24   GLUCOSE 431* 517* 392* 218* 139*  BUN 19 25* 23* 21* 29*  CREATININE 1.13 1.54* 1.30* 1.09 1.36*  CALCIUM 8.4* 8.1* 7.1* 7.2* 7.1*   Liver Function Tests:  Recent Labs Lab 05/06/15 1833 05/07/15 0330  AST 34 25  ALT 26 23  ALKPHOS 105 79  BILITOT 1.3* 1.3*  PROT 6.8 5.4*  ALBUMIN 3.2* 2.3*   No results for input(s): LIPASE, AMYLASE in the last 168 hours. No results for input(s): AMMONIA in the last 168 hours. CBC:  Recent Labs Lab 05/06/15 0343 05/06/15 1833 05/07/15 0330 05/08/15 0530 05/09/15 0445  WBC 15.6* 16.7* 14.1* 12.6* 12.5*  NEUTROABS 13.8* 15.8* 13.0* 10.7* 9.7*  HGB 13.2 13.5 11.4* 11.1* 10.8*  HCT 38.3* 39.2 33.5* 32.6* 32.0*  MCV 90.5 90.3 90.3 91.1 90.1  PLT 217 221 186 187 182   Cardiac Enzymes: No results for input(s): CKTOTAL, CKMB, CKMBINDEX, TROPONINI in the last 168 hours. BNP: BNP (last 3 results) No results for input(s): BNP in the last 8760 hours.  ProBNP (last 3 results) No results for input(s): PROBNP in the last 8760 hours.  CBG:  Recent Labs Lab 05/08/15 1207 05/08/15 1725 05/08/15 2116 05/09/15 0719 05/09/15 1119  GLUCAP 213* 156* 183* 122* 148*     Signed:  CHIU, STEPHEN K  [facility] 05/09/2015, 4:34 PM

## 2015-05-09 NOTE — Progress Notes (Signed)
OT Cancellation Note  Patient Details Name: Alex Ryan MRN: [id number] DOB: 1971-01-16   Cancelled Treatment:    Reason Eval/Treat Not Completed: Other (comment) .  Pt feels comfortable with reacher for ADLs.  He is hoping he can go home later today.  He will benefit from 3:1 commode.  He has a RW at home.    Lizann Edelman 05/09/2015, 9:01 AM  Lesle Chris, OTR/L [phone] 05/09/2015

## 2015-05-11 LAB — CULTURE, BLOOD (ROUTINE X 2)
CULTURE: NO GROWTH
Culture: NO GROWTH

## 2015-05-14 ENCOUNTER — Telehealth: Payer: Self-pay | Admitting: Family Medicine

## 2015-05-14 NOTE — Telephone Encounter (Signed)
Pt was in hospital and does not have insurance and diane is  requesting Education officer, museum. Diane dressing pt wound and did not have foam and dressed wound with 4x4 . Diane will redress wound with foam once it comes in. OK TO leave verbal order

## 2015-05-14 NOTE — Telephone Encounter (Signed)
Ok to give verbal order for social worker?

## 2015-05-15 LAB — WOUND CULTURE

## 2015-05-15 NOTE — Telephone Encounter (Signed)
Please have a Education officer, museum see them

## 2015-05-15 NOTE — Telephone Encounter (Signed)
Diane given verbal order, per Dr. Sarajane Jews.

## 2015-05-16 ENCOUNTER — Telehealth: Payer: Self-pay | Admitting: Family Medicine

## 2015-05-16 MED ORDER — AMOXICILLIN-POT CLAVULANATE 875-125 MG PO TABS: 1.0000 | ORAL_TABLET | Freq: Two times a day (BID) | ORAL | Status: DC

## 2015-05-16 NOTE — Telephone Encounter (Signed)
I sent script e-scribe and spoke with pt. 

## 2015-05-16 NOTE — Telephone Encounter (Signed)
Patient Name: Alex Ryan  DOB: June 18, 1970    Initial Comment caller states he has cellulitis on his leg   Nurse Assessment  Nurse: Raphael Gibney, RN, Vanita Ingles Date/Time Eilene Ghazi Time): 05/16/2015 11:06:09 AM  Confirm and document reason for call. If symptomatic, describe symptoms. ---Caller states he had cellulitis in his leg. Went into his lymph nodes. Has follow up appt on Tuesday. He is no longer taking antibiotics. He is getting home health care. His right upper leg towards his groin is red and is painful but not as painful as before. Red area is about 1/2 inch in diameter. His leg is swollen. No fever. He was discharged from the hospital a week ago on Friday for cellulitis. BP 198/89. They changed his BP medications.  Has the patient traveled out of the country within the last 30 days? ---Not Applicable  Does the patient have any new or worsening symptoms? ---Yes  Will a triage be completed? ---Yes  Related visit to physician within the last 2 weeks? ---Yes  Does the PT have any chronic conditions? (i.e. diabetes, asthma, etc.) ---Yes  List chronic conditions. ---diabetes; HTN  Is this a behavioral health or substance abuse call? ---No     Guidelines    Guideline Title Affirmed Question Affirmed Notes  Leg Swelling and Edema [1] Thigh or calf pain AND [2] only 1 side AND [3] present > 1 hour    Final Disposition User   See Physician within 4 Hours (or PCP triage) Raphael Gibney, RN, Vera    Comments  No appts available at [facility] and pt does not want to go to urgent care or another [place] office. The home health nurse saw him today and thinks he needs another round of antibiotics. Please call pt back and let him know if more antibiotics will be called in.   Referrals  GO TO FACILITY REFUSED   Disagree/Comply: Disagree  Disagree/Comply Reason: Disagree with instructions

## 2015-05-16 NOTE — Telephone Encounter (Signed)
Call in Augmentin [phone] bid for 10 days  

## 2015-05-21 ENCOUNTER — Encounter: Payer: Self-pay | Admitting: Family Medicine

## 2015-05-21 ENCOUNTER — Ambulatory Visit (INDEPENDENT_AMBULATORY_CARE_PROVIDER_SITE_OTHER): Payer: Self-pay | Admitting: Family Medicine

## 2015-05-21 VITALS — BP 140/82 | HR 90 | Temp 98.7°F | Ht 70.0 in | Wt 270.2 lb

## 2015-05-21 DIAGNOSIS — A419 Sepsis, unspecified organism: Secondary | ICD-10-CM

## 2015-05-21 DIAGNOSIS — I10 Essential (primary) hypertension: Secondary | ICD-10-CM

## 2015-05-21 DIAGNOSIS — Z794 Long term (current) use of insulin: Secondary | ICD-10-CM

## 2015-05-21 DIAGNOSIS — L03115 Cellulitis of right lower limb: Secondary | ICD-10-CM

## 2015-05-21 DIAGNOSIS — R6520 Severe sepsis without septic shock: Secondary | ICD-10-CM

## 2015-05-21 DIAGNOSIS — E11319 Type 2 diabetes mellitus with unspecified diabetic retinopathy without macular edema: Secondary | ICD-10-CM

## 2015-05-21 LAB — BASIC METABOLIC PANEL
BUN: 14 mg/dL (ref 6–23)
CHLORIDE: 102 meq/L (ref 96–112)
CO2: 35 meq/L — AB (ref 19–32)
Calcium: 8.4 mg/dL (ref 8.4–10.5)
Creatinine, Ser: 0.84 mg/dL (ref 0.40–1.50)
GFR: 104.86 mL/min (ref >60.00)
Glucose, Bld: 113 mg/dL — ABNORMAL HIGH (ref 70–99)
POTASSIUM: 4.7 meq/L (ref 3.5–5.1)
SODIUM: 141 meq/L (ref 135–145)

## 2015-05-21 MED ORDER — METOPROLOL SUCCINATE ER 50 MG PO TB24: 50.0000 mg | ORAL_TABLET | Freq: Every day | ORAL | Status: DC

## 2015-05-21 MED FILL — METOPROLOL SUCC ER 50 MG TA: 50 | 90 days supply | Qty: 90 | Fill #0

## 2015-05-21 NOTE — Progress Notes (Signed)
Pre visit review using our clinic review tool, if applicable. No additional management support is needed unless otherwise documented below in the visit note. 

## 2015-05-21 NOTE — Progress Notes (Signed)
   Subjective:    Patient ID: Alex Ryan, male    DOB: Dec 27, 1970, 45 y.o.   MRN: [id number]  HPI Here to follow up a hospital stay from 05-06-15 to 05-09-15 for cellulitis on the legs which led to sepsis. Cultures revealed this to be due a Staph aureus infection and not MRSA. He was treated with IV Vancomycin and Zosyn and was then switched to oral Augmentin. The infection responded quickly to the antibiotics and he was sent home on Augmentin. We then added an additional 10 days of Augmentin so that he now has 4 more days to go. He feels much better though he is weak. He says his leg looks much better. It is still swollen but the redness has gone away and the pain is gone. Home health nurses are coming to dress the leg twice a week. His glucoses have been stable with am fasting values around 130-140 and he has not gotten any values above 200 since coming home. His BP has been borderline high with systolic values in the 0000000 and 150s. He was to have resumed taking metoprolol succinate when he went home but he did not because he is out of refills.   Review of Systems  Respiratory: Negative.   Cardiovascular: Negative.   Genitourinary: Negative.   Neurological: Positive for weakness and numbness.       Objective:   Physical Exam  Constitutional: He is oriented to person, place, and time. He appears well-developed and well-nourished.  Using a walker   Neck: No thyromegaly present.  Cardiovascular: Normal rate, regular rhythm, normal heart sounds and intact distal pulses.   Pulmonary/Chest: Effort normal and breath sounds normal.  Lymphadenopathy:    He has no cervical adenopathy.  Neurological: He is alert and oriented to person, place, and time.  Skin:  We did not examine the right leg since it has a fresh dressing on it up to the knee           Assessment & Plan:  He is recovering from a recent sepsis which started as a cellulitis in the right leg. He will finish the Augmentin. He  will continue to have dressing changes per the home health nurses. We will order PT to help regain strength and to help him return walking without assistance. His diabetes is stable, and he will follow up with Dr. Dwyane Dee on 05-29-15. His renal function has returned to normal but we will check a BMET today. His HTN has been borderline so we will add back Metoprolol succinate to his regimen but reduce the dose to 50 mg daily.

## 2015-05-22 ENCOUNTER — Telehealth: Payer: Self-pay | Admitting: Family Medicine

## 2015-05-22 NOTE — Telephone Encounter (Signed)
Just rest and take it easy today. If he gets any worse he should come back in

## 2015-05-22 NOTE — Telephone Encounter (Signed)
Pt hit his elbow and had a scratch. But this morning pt got dizzy and nauseous   .  Please advise

## 2015-05-22 NOTE — Telephone Encounter (Signed)
Pt is aware.  

## 2015-05-22 NOTE — Telephone Encounter (Signed)
Alex Ryan to say pt had a fall. He was sleeping and roll off the bed    [phone]

## 2015-05-24 ENCOUNTER — Telehealth: Payer: Self-pay | Admitting: Family Medicine

## 2015-05-24 NOTE — Telephone Encounter (Signed)
Per Dr. Sarajane Jews okay to extend for 2 more weeks. I spoke with Meghan and gave verbal orders.

## 2015-05-24 NOTE — Telephone Encounter (Signed)
Meghan call from [place] to say that today was suppose to be her discharge day with the pt. She said he is still needing wound care. She is asking for verbal orders     Call back number [phone]

## 2015-05-27 ENCOUNTER — Telehealth: Payer: Self-pay | Admitting: Family Medicine

## 2015-05-27 DIAGNOSIS — L03116 Cellulitis of left lower limb: Secondary | ICD-10-CM

## 2015-05-27 DIAGNOSIS — L03115 Cellulitis of right lower limb: Secondary | ICD-10-CM

## 2015-05-27 DIAGNOSIS — R6520 Severe sepsis without septic shock: Secondary | ICD-10-CM

## 2015-05-27 DIAGNOSIS — E11621 Type 2 diabetes mellitus with foot ulcer: Secondary | ICD-10-CM

## 2015-05-27 NOTE — Telephone Encounter (Signed)
Alex Ryan would like to know if pt need more abx. Pt finished abx yesterday

## 2015-05-28 MED ORDER — AMOXICILLIN-POT CLAVULANATE 875-125 MG PO TABS: 1.0000 | ORAL_TABLET | Freq: Two times a day (BID) | ORAL | Status: DC

## 2015-05-28 NOTE — Telephone Encounter (Signed)
Call in Augmentin [phone] bid for 30 days ( #[phone])

## 2015-05-28 NOTE — Telephone Encounter (Signed)
I spoke with pt, right leg is still red, warm to the touch and a little swollen. Requesting antibiotic and send to [facility] outpatient pharmacy.

## 2015-05-28 NOTE — Telephone Encounter (Signed)
I sent script e-scribe and spoke with pt. 

## 2015-05-29 ENCOUNTER — Ambulatory Visit (INDEPENDENT_AMBULATORY_CARE_PROVIDER_SITE_OTHER): Payer: Self-pay | Admitting: Endocrinology

## 2015-05-29 ENCOUNTER — Encounter: Payer: Self-pay | Admitting: Endocrinology

## 2015-05-29 VITALS — BP 140/84 | HR 68 | Temp 97.8°F | Resp 16 | Ht 70.0 in | Wt 269.0 lb

## 2015-05-29 DIAGNOSIS — Z794 Long term (current) use of insulin: Secondary | ICD-10-CM

## 2015-05-29 DIAGNOSIS — E1165 Type 2 diabetes mellitus with hyperglycemia: Secondary | ICD-10-CM

## 2015-05-29 NOTE — Progress Notes (Signed)
Patient ID: Alex Ryan, male   DOB: [birth year], 45 y.o.   MRN: [id number]           Reason for Appointment: Follow-up for Type 2 Diabetes  Referring physician: Clent Ridges  History of Present Illness:          Date of diagnosis of type 2 diabetes mellitus: 1996       Background history:  He had been on oral hypoglycemic drugs for several years with variable control, prior history is not available in the record Because of poor control in 2012 he was tried on Byetta in addition to his metformin and Amaryl Apparently this was not effective and he was started on insulin in 2013 with Lantus and Humalog, his A1c then was 8.3 He was also ordered an insulin pump by his other endocrinologist about 2 years ago but he could not do this because of visual difficulties He believes that when he was able to take his insulin consistently has blood sugars were usually not above 150 and also he would get some hypoglycemia also  Recent history:   INSULIN regimen is described as:  Novolin N, 29  Units acb and pcs; 4 R  Before lunch    He was seen in consultation in 2016 for persistently poorly controlled diabetes and was taking large doses of Toujeo and Humalog along with Invokamet.   Subsequently because of lack of insurance and prescription coverage he stopped taking his insulin and blood sugars were markedly elevated when he was admitted for cellulitis of leg  In 12/16  Current blood sugar patterns and problems identified:  He did not bring his blood sugar record and now is using a Generic monitor which he shares with his wife  He thinks his fasting blood sugars are excellent fasting and was 90 today in the morning  He is taking his evening NPH right after supper not bedtime  His blood sugars are usually higher at lunch and bedtime although right after breakfast his glucose was 113 in the lab  He has not taken any mealtime insulin coverage for his breakfast and supper and was prescribed only 4 units for  lunch  Previously on Invokana and did not take this because of cost, also not taking metformin currently  He thinks his blood sugars are much better because he has significant change his diet with reducing high-fat meals and eating more salads and vegetables  He has gained weight but he thinks this is from swelling of his legs  Oral hypoglycemic drugs the patient is taking are:    None Side effects from medications have been: None  Compliance with the medical regimen: Improved Hypoglycemia:   none for some time  Glucose monitoring:  done 2 times a day         Glucometer: Relion        Blood Glucose readings from recall:   PRE-MEAL Fasting Lunch Dinner Bedtime Overall  Glucose range: 90-120 160 120 180   Mean/median:         Self-care: The diet that the patient has been following is: tries to limit fatty foods      Breakfast is oatmeal/ toast with egg usually  Dietician visit, most recent: Years ago               Exercise:  unable to recently  Weight history:  Wt Readings from Last 3 Encounters:  05/29/15 269 lb (122.018 kg)  05/21/15 270 lb 3.2 oz (122.562 kg)  05/07/15  258 lb 13.1 oz (117.4 kg)    Glycemic control:    Lab Results  Component Value Date   HGBA1C 12.3* 05/06/2015   HGBA1C 11.6* 07/18/2014   HGBA1C 11.3* 12/11/2013   Lab Results  Component Value Date   MICROALBUR 67.4* 07/18/2014   LDLCALC 123* 09/21/2013   CREATININE 0.84 05/21/2015         Medication List       This list is accurate as of: 05/29/15  4:40 PM.  Always use your most recent med list.               albuterol 108 (90 Base) MCG/ACT inhaler  Commonly known as:  PROVENTIL HFA;VENTOLIN HFA  Inhale 2 puffs into the lungs every 4 (four) hours as needed for wheezing or shortness of breath. PRN:  Allergies/breathing     Alcohol Swabs Pads  Use up to 10 times per day for testing and insulin injections, diagnosis code is 250.00     amLODipine 10 MG tablet  Commonly known as:   NORVASC  Take 1 tablet (10 mg total) by mouth daily.     amoxicillin-clavulanate 875-125 MG tablet  Commonly known as:  AUGMENTIN  Take 1 tablet by mouth 2 (two) times daily.     aspirin 325 MG tablet  Take 325 mg by mouth daily. Reported on 05/29/2015     cloNIDine 0.1 MG tablet  Commonly known as:  CATAPRES  Take 1 tablet (0.1 mg total) by mouth 2 (two) times daily.     diphenhydrAMINE 25 MG tablet  Commonly known as:  BENADRYL  Take 25 mg by mouth every 6 (six) hours as needed.     FLUoxetine 40 MG capsule  Commonly known as:  PROZAC  Take 1 capsule (40 mg total) by mouth daily.     furosemide 40 MG tablet  Commonly known as:  LASIX  TAKE 1 TABLET BY MOUTH TWICE DAILY AS NEEDED FOR FLUID RETENTION     gabapentin 300 MG capsule  Commonly known as:  NEURONTIN  Take 1 capsule (300 mg total) by mouth 3 (three) times daily.     insulin NPH Human 100 UNIT/ML injection  Commonly known as:  HUMULIN N  Inject 0.25 mLs (25 Units total) into the skin 2 (two) times daily before a meal.     insulin regular 100 units/mL injection  Commonly known as:  HUMULIN R  Inject 0.04 mLs (4 Units total) into the skin 3 (three) times daily before meals.     metoprolol succinate 50 MG 24 hr tablet  Commonly known as:  TOPROL-XL  Take 1 tablet (50 mg total) by mouth daily. Take with or immediately following a meal.     Needles & Syringes Misc  Inject 2 ml intramuscular every 28 days.        Allergies:  Allergies  Allergen Reactions  . Codeine Hives    Hyper  . Erythromycin Nausea And Vomiting  . Other     Pre-op cleaning wipes:. itching  . Soap Itching    "dial soap"    Past Medical History  Diagnosis Date  . Hypertension   . Anxiety   . Shingles 2010  . Allergic rhinitis   . Diabetes mellitus     sees Dr. Dorisann Frames   . Retinopathy of both eyes     sees Dr. Luciana Axe   . Neuropathy in diabetes (HCC)   . Cellulitis     legs - hx  . OSA (  obstructive sleep apnea)      CPAP- not wearing  . Recurrent upper respiratory infection (URI)     frequent bronchcitis  . H/O hiatal hernia     pt thinks so  . Headache(784.0)     had migraine- not in years  . Retinopathy     right eye  . Asthma   . Chronic kidney disease   . Bladder infection     in June -Was on Cipro  . Heart murmur     Followed by Dr Jens Som  . Hyperlipidemia     Past Surgical History  Procedure Laterality Date  . Tonsillectomy  1983  . Eye surgery  2012    Cataract with lens  Right  . Pars plana vitrectomy w/ scleral buckle  09/01/2011    Procedure: PARS PLANA VITRECTOMY WITH LASER FOR MACULAR HOLE;  Surgeon: Edmon Crape, MD;  Location: [facility] OR;  Service: Ophthalmology;  Laterality: Right;  Pars Plana Vitrectomy with Laser and Membrane Peel; Insertion Silicone Oil  . Pars plana vitrectomy  12/04/2011    Procedure: PARS PLANA VITRECTOMY WITH 25 GAUGE;  Surgeon: Edmon Crape, MD;  Location: [facility] OR;  Service: Ophthalmology;  Laterality: Left;  Insertion Silicon Oil 5000 CS and    . Vitrectomy  2013    per Dr. Luciana Axe    Family History  Problem Relation Age of Onset  . Diabetes Mother   . Transient ischemic attack Father   . Cancer Father   . Anesthesia problems Neg Hx   . Diabetes Maternal Uncle   . Diabetes Maternal Grandmother   . Heart disease Maternal Grandfather     Social History:  reports that he quit smoking about 5 years ago. He has quit using smokeless tobacco. He reports that he does not drink alcohol or use illicit drugs.    Review of Systems    Lipid history: Previously had been started on pravastatin 20 mg which he is not taking now    Lab Results  Component Value Date   CHOL 197 09/21/2013   HDL 48.30 09/21/2013   LDLCALC 123* 09/21/2013   LDLDIRECT 103.0 11/05/2014   TRIG 131.0 09/21/2013   CHOLHDL 4 09/21/2013           Most recent eye exam was 6/15, Dr. Luciana Axe   Diabetic foot exam in 5/16 showed sensory loss  He has had hypogonadism but not  treated because of cost of medications  HYPERTENSION: He has been prescribed clonidine and amlodipine for this   LABS:  No visits with results within 1 Week(s) from this visit. Latest known visit with results is:  Office Visit on 05/21/2015  Component Date Value Ref Range Status  . Sodium 05/21/2015 141  135 - 145 mEq/L Final  . Potassium 05/21/2015 4.7  3.5 - 5.1 mEq/L Final  . Chloride 05/21/2015 102  96 - 112 mEq/L Final  . CO2 05/21/2015 35* 19 - 32 mEq/L Final  . Glucose, Bld 05/21/2015 113* 70 - 99 mg/dL Final  . BUN 73/22/0254 14  6 - 23 mg/dL Final  . Creatinine, Ser 05/21/2015 0.84  0.40 - 1.50 mg/dL Final  . Calcium 27/10/2374 8.4  8.4 - 10.5 mg/dL Final  . GFR 28/31/5176 104.86  >60.00 mL/min Final    Physical Examination:  BP 140/84 mmHg  Pulse 68  Temp(Src) 97.8 F (36.6 C)  Resp 16  Ht 5\' 10"  (1.778 m)  Wt 269 lb (122.018 kg)  BMI 38.60 kg/m2  SpO2  96%          ASSESSMENT:  Diabetes type 2, uncontrolled    He has had poor control in the past with significant insulin requirement but more recently his blood sugars are surprisingly much better with only about 60 units of insulin a day See history of present illness for detailed discussion of his current management, blood sugar patterns and problems identified He has however improved his diet significantly and this may be a factor, also improved blood sugar in the hospital may have resolved some of his glucose toxicity Currently taking NPH insulin which is most economical but is not taking any mealtime coverage for breakfast and suppertime and tends to have high readings at lunch and supper by history  PLAN:   Bring blood sugar diary on each visit and record blood sugars as directed  Start taking 4 units at breakfast and supper time also and adjust based on meal size  Change dosage of evening NPH to bedtime instead of suppertime  For now reduce bedtime dose to 27  Patient Instructions  Add 4 units at  each meal, may mix in ams  Take pm N insulin at bedtime; adjust based on am sugar: take 27 units  Check blood sugars on waking up 3  times a week Also check blood sugars about 2 hours after a meal and do this after different meals by rotation  Recommended blood sugar levels on waking up is 90-130 and about 2 hours after meal is 130-160  Please bring your blood sugar monitor to each visit, thank you        Counseling time on subjects discussed above is over 50% of today's 25 minute visit   Abhishek Levesque 05/29/2015, 4:40 PM   Note: This office note was prepared with Dragon voice recognition system technology. Any transcriptional errors that result from this process are unintentional.

## 2015-05-29 NOTE — Patient Instructions (Addendum)
Add 4 units at each meal, may mix in ams  Take pm N insulin at bedtime; adjust based on am sugar: take 27 units  Check blood sugars on waking up 3  times a week Also check blood sugars about 2 hours after a meal and do this after different meals by rotation  Recommended blood sugar levels on waking up is 90-130 and about 2 hours after meal is 130-160  Please bring your blood sugar monitor to each visit, thank you

## 2015-06-05 ENCOUNTER — Ambulatory Visit (INDEPENDENT_AMBULATORY_CARE_PROVIDER_SITE_OTHER): Payer: Self-pay | Admitting: Family Medicine

## 2015-06-05 ENCOUNTER — Encounter: Payer: Self-pay | Admitting: Family Medicine

## 2015-06-05 VITALS — BP 157/82 | HR 80 | Temp 98.6°F | Ht 70.0 in | Wt 268.0 lb

## 2015-06-05 DIAGNOSIS — Z794 Long term (current) use of insulin: Secondary | ICD-10-CM

## 2015-06-05 DIAGNOSIS — L03115 Cellulitis of right lower limb: Secondary | ICD-10-CM

## 2015-06-05 DIAGNOSIS — N179 Acute kidney failure, unspecified: Secondary | ICD-10-CM

## 2015-06-05 DIAGNOSIS — E11319 Type 2 diabetes mellitus with unspecified diabetic retinopathy without macular edema: Secondary | ICD-10-CM

## 2015-06-05 DIAGNOSIS — L03116 Cellulitis of left lower limb: Secondary | ICD-10-CM

## 2015-06-05 MED ORDER — FUROSEMIDE 40 MG PO TABS: 40.0000 mg | ORAL_TABLET | Freq: Every day | ORAL | Status: DC

## 2015-06-05 NOTE — Progress Notes (Signed)
Pre visit review using our clinic review tool, if applicable. No additional management support is needed unless otherwise documented below in the visit note. 

## 2015-06-06 ENCOUNTER — Other Ambulatory Visit: Payer: Self-pay | Admitting: Family Medicine

## 2015-06-06 MED ORDER — FUROSEMIDE 40 MG PO TABS: 40.0000 mg | ORAL_TABLET | Freq: Every day | ORAL | Status: DC

## 2015-06-06 MED FILL — FUROSEMIDE 40 MG TABLET: 40 | 90 days supply | Qty: 90 | Fill #0

## 2015-06-06 NOTE — Telephone Encounter (Signed)
Clarification for Lasix take once or twice a day? Per Dr. Sarajane Jews should be Lasix 40 mg once a day. I resent script e-scribe with correction directions.

## 2015-06-06 NOTE — Progress Notes (Signed)
   Subjective:    Patient ID: Alex Ryan, male    DOB: [birth year], 45 y.o.   MRN: [id number]  HPI Here to follow up on a hospital stay from 05-06-15 to 05-09-15 for sepsis related to cellulitis of the legs. He has labile diabetes and has been prone to develop cellulitis of the legs. During the recent admission he was septic and had some prerenal failure from dehydration. He responded well to IV fluids and antibiotics, and he was sent home on Augmentin. His renal function returned to baseline. Since getting home he has steadily improved and he is getting his strength back. He is still on Augmentin, and [place] have been doing dressing changes to the wounds on his legs 3 days a week using silicone foam and calcium alginate. His legs are looking better and better. No fever. His diabetes was out of control but now this is well managed. He saw Dr. Dwyane Dee a few days ago.    Review of Systems  Constitutional: Negative.   Respiratory: Negative.   Cardiovascular: Negative.   Skin: Positive for rash.  Neurological: Negative.        Objective:   Physical Exam  Constitutional: He is oriented to person, place, and time. He appears well-developed and well-nourished.  Walking unassisted   Neck: No thyromegaly present.  Cardiovascular: Normal rate, regular rhythm, normal heart sounds and intact distal pulses.   Pulmonary/Chest: Effort normal and breath sounds normal.  Lymphadenopathy:    He has no cervical adenopathy.  Neurological: He is alert and oriented to person, place, and time.  Skin:  We unwrapped the right leg today and the cellulitis wounds are looking much better. We left the left leg wrapped.          Assessment & Plan:  His cellulitis is steadily resolving. We will agree to stop the Home Health nurse visits. The family will unwrap the other leg wehe they get home and they will leave both legs unwrapped at this point. He will finish out the last 2 weeks of Augmentin.

## 2015-06-10 ENCOUNTER — Telehealth: Payer: Self-pay | Admitting: Family Medicine

## 2015-06-10 NOTE — Telephone Encounter (Signed)
I spoke with pt and gave below advice. 

## 2015-06-10 NOTE — Telephone Encounter (Signed)
Patient Name: Alex Ryan  DOB: June 21, 1970    Initial Comment Caller states, out of a blood pressure Rx today, wants to know if he can take alternate Rx until he can get the new one filled ?    Nurse Assessment  Nurse: Orvan Seen, RN, [place] Date/Time (Eastern Time): 06/10/2015 10:28:42 AM  Confirm and document reason for call. If symptomatic, describe symptoms. You must click the next button to save text entered. ---Caller states he was in the hospital in 04/2015 and had his blood pressure medication changed from Losartan to clonidine. Caller states he is out of the clonidine and cannot get it again until Wednesday due to financial issues. Caller is requesting to know if he can take the Losartan that he still has in its place?  Has the patient traveled out of the country within the last 30 days? ---Not Applicable  Does the patient have any new or worsening symptoms? ---No  Please document clinical information provided and list any resource used. ---Advised caller i would send a note to the MD and have someone follow up with him     Guidelines    Guideline Title Affirmed Question Affirmed Notes       Final Disposition User   Clinical Call Orvan Seen, RN, Gardner Candle

## 2015-06-10 NOTE — Telephone Encounter (Signed)
Please advise 

## 2015-06-10 NOTE — Telephone Encounter (Signed)
Tell him to take the old Losartan like before until he gets the Clonidine filled on Wednesday

## 2015-06-12 ENCOUNTER — Telehealth: Payer: Self-pay | Admitting: Family Medicine

## 2015-06-12 NOTE — Telephone Encounter (Signed)
Pt states they started him on clonidine (CATAPRES) 0.1 MG tablet .  while he was in the hospital in December for cellulitis.  Pt would like to know if Dr Sarajane Jews will call in a 90 day of this rx to [facility] outpt pharm

## 2015-06-12 NOTE — Telephone Encounter (Signed)
Call in #[phone] with 11 rf 

## 2015-06-13 MED ORDER — CLONIDINE HCL 0.1 MG PO TABS: 0.1000 mg | ORAL_TABLET | Freq: Two times a day (BID) | ORAL | Status: DC

## 2015-06-13 MED FILL — cloNIDine HCL 0.1 MG TABS: 0.1 | 90 days supply | Qty: 180 | Fill #0

## 2015-06-13 NOTE — Telephone Encounter (Signed)
I sent script e-scribe and left a voice message for pt. 

## 2015-06-17 ENCOUNTER — Ambulatory Visit (INDEPENDENT_AMBULATORY_CARE_PROVIDER_SITE_OTHER): Payer: Self-pay | Admitting: Family Medicine

## 2015-06-17 ENCOUNTER — Encounter: Payer: Self-pay | Admitting: Family Medicine

## 2015-06-17 VITALS — BP 151/78 | HR 77 | Temp 97.7°F | Ht 70.0 in | Wt 281.0 lb

## 2015-06-17 DIAGNOSIS — L03115 Cellulitis of right lower limb: Secondary | ICD-10-CM

## 2015-06-17 DIAGNOSIS — R600 Localized edema: Secondary | ICD-10-CM

## 2015-06-17 MED ORDER — FUROSEMIDE 40 MG PO TABS: 40.0000 mg | ORAL_TABLET | Freq: Two times a day (BID) | ORAL | Status: DC

## 2015-06-17 MED ORDER — POTASSIUM CHLORIDE ER 10 MEQ PO TBCR: 10.0000 meq | EXTENDED_RELEASE_TABLET | Freq: Two times a day (BID) | ORAL | Status: DC

## 2015-06-17 NOTE — Progress Notes (Signed)
Pre visit review using our clinic review tool, if applicable. No additional management support is needed unless otherwise documented below in the visit note. 

## 2015-06-17 NOTE — Progress Notes (Signed)
   Subjective:    Patient ID: Alex Ryan, male    DOB: 1970/06/13, 45 y.o.   MRN: [id number]  HPI Here for increased swelling in both legs and also the appearance of blisters on the right leg. He is recovering from a bout of cellulitis in the right leg and he is still taking Augmentin. He had been on 40 mg of Lasix once daily. The leg looks better with no redness or warmth but a number of blisters containing clear fluid have appeared. No SOB.    Review of Systems  Constitutional: Negative.   Respiratory: Negative.   Cardiovascular: Positive for leg swelling. Negative for chest pain and palpitations.       Objective:   Physical Exam  Constitutional: No distress.  Cardiovascular: Normal rate, regular rhythm, normal heart sounds and intact distal pulses.   Pulmonary/Chest: Effort normal and breath sounds normal. No respiratory distress. He has no wheezes. He has no rales.  Skin:  Both lower legs have 3+ edema and the right leg has a number of vesicle and larger bullae containing clear fluid. No erythema or warmth          Assessment & Plan:  The cellulitis is responding nicely to the antibiotic but he has a lot of fluid retention. We will increase the Lasix to 40 mg bid and add some potassium. Recheck one week.

## 2015-06-24 ENCOUNTER — Ambulatory Visit (INDEPENDENT_AMBULATORY_CARE_PROVIDER_SITE_OTHER): Payer: Self-pay | Admitting: Family Medicine

## 2015-06-24 ENCOUNTER — Encounter: Payer: Self-pay | Admitting: Family Medicine

## 2015-06-24 VITALS — BP 177/88 | HR 87 | Temp 99.0°F | Ht 70.0 in | Wt 291.0 lb

## 2015-06-24 DIAGNOSIS — L03818 Cellulitis of other sites: Secondary | ICD-10-CM

## 2015-06-24 DIAGNOSIS — R600 Localized edema: Secondary | ICD-10-CM

## 2015-06-24 DIAGNOSIS — N5089 Other specified disorders of the male genital organs: Secondary | ICD-10-CM

## 2015-06-24 MED ORDER — CEPHALEXIN 500 MG PO CAPS: 500.0000 mg | ORAL_CAPSULE | Freq: Four times a day (QID) | ORAL | Status: DC

## 2015-06-24 MED ORDER — FUROSEMIDE 40 MG PO TABS: 80.0000 mg | ORAL_TABLET | Freq: Two times a day (BID) | ORAL | Status: DC

## 2015-06-24 MED ORDER — POTASSIUM CHLORIDE ER 10 MEQ PO TBCR: 20.0000 meq | EXTENDED_RELEASE_TABLET | Freq: Two times a day (BID) | ORAL | Status: DC

## 2015-06-24 MED FILL — CEPHALEXIN 500 MG CAPSULE: 500 | 30 days supply | Qty: 120 | Fill #0

## 2015-06-24 NOTE — Progress Notes (Signed)
   Subjective:    Patient ID: Alex Ryan, male    DOB: 09-30-1970, 45 y.o.   MRN: [id number]  HPI Here with worsening fluid retention and possible cellulitis of the scrotum. He has been taking Lasix 40 mg bid but he has been swelling worse than ever in both legs, in the scrotum, and in the abdomen. No SOB. No fever. He is worried because the scrotum is now red and painful.    Review of Systems  Constitutional: Negative.   Respiratory: Negative.   Cardiovascular: Positive for leg swelling. Negative for chest pain and palpitations.  Gastrointestinal: Positive for abdominal distention. Negative for nausea, vomiting, abdominal pain, diarrhea, constipation, blood in stool, anal bleeding and rectal pain.  Genitourinary: Negative.        Objective:   Physical Exam  Constitutional: He appears well-developed and well-nourished.  Cardiovascular: Normal rate, regular rhythm, normal heart sounds and intact distal pulses.   Pulmonary/Chest: Effort normal and breath sounds normal. No respiratory distress. He has no wheezes. He has no rales.  Abdominal: Soft. Bowel sounds are normal. He exhibits no mass. There is no tenderness. There is no rebound and no guarding.  Abdomen is tense and swollen   Genitourinary:  Scrotum is swollen, warm, tender, and red   Musculoskeletal:  Both legs have 4+ edema          Assessment & Plan:  Worsening edema and cellulitis. We will increase the Lasix to a total of 80 mg bid and increase the potassium to a total of 20 mEq bid. Cover for emerging cellulitis with Keflex 500 mg qid. Recheck in one week

## 2015-06-24 NOTE — Progress Notes (Signed)
Pre visit review using our clinic review tool, if applicable. No additional management support is needed unless otherwise documented below in the visit note. 

## 2015-06-30 ENCOUNTER — Emergency Department (HOSPITAL_COMMUNITY): Payer: Medicaid Other

## 2015-06-30 ENCOUNTER — Encounter (HOSPITAL_COMMUNITY): Payer: Self-pay | Admitting: Emergency Medicine

## 2015-06-30 ENCOUNTER — Inpatient Hospital Stay (HOSPITAL_COMMUNITY)
Admission: EM | Admit: 2015-06-30 | Discharge: 2015-07-10 | DRG: 193 | Disposition: A | Discharge status: Home / Self Care | Payer: Medicaid Other | Attending: Internal Medicine | Admitting: Internal Medicine

## 2015-06-30 DIAGNOSIS — E1165 Type 2 diabetes mellitus with hyperglycemia: Secondary | ICD-10-CM | POA: Diagnosis present

## 2015-06-30 DIAGNOSIS — E1121 Type 2 diabetes mellitus with diabetic nephropathy: Secondary | ICD-10-CM | POA: Diagnosis present

## 2015-06-30 DIAGNOSIS — Z8249 Family history of ischemic heart disease and other diseases of the circulatory system: Secondary | ICD-10-CM | POA: Diagnosis not present

## 2015-06-30 DIAGNOSIS — E1142 Type 2 diabetes mellitus with diabetic polyneuropathy: Secondary | ICD-10-CM | POA: Diagnosis present

## 2015-06-30 DIAGNOSIS — E1122 Type 2 diabetes mellitus with diabetic chronic kidney disease: Secondary | ICD-10-CM | POA: Diagnosis present

## 2015-06-30 DIAGNOSIS — K760 Fatty (change of) liver, not elsewhere classified: Secondary | ICD-10-CM | POA: Diagnosis present

## 2015-06-30 DIAGNOSIS — J9601 Acute respiratory failure with hypoxia: Secondary | ICD-10-CM | POA: Diagnosis present

## 2015-06-30 DIAGNOSIS — F419 Anxiety disorder, unspecified: Secondary | ICD-10-CM | POA: Diagnosis present

## 2015-06-30 DIAGNOSIS — N182 Chronic kidney disease, stage 2 (mild): Secondary | ICD-10-CM | POA: Diagnosis present

## 2015-06-30 DIAGNOSIS — Z794 Long term (current) use of insulin: Secondary | ICD-10-CM

## 2015-06-30 DIAGNOSIS — I472 Ventricular tachycardia: Secondary | ICD-10-CM | POA: Diagnosis present

## 2015-06-30 DIAGNOSIS — J189 Pneumonia, unspecified organism: Secondary | ICD-10-CM | POA: Diagnosis present

## 2015-06-30 DIAGNOSIS — R160 Hepatomegaly, not elsewhere classified: Secondary | ICD-10-CM

## 2015-06-30 DIAGNOSIS — R0602 Shortness of breath: Secondary | ICD-10-CM | POA: Diagnosis present

## 2015-06-30 DIAGNOSIS — F329 Major depressive disorder, single episode, unspecified: Secondary | ICD-10-CM | POA: Diagnosis present

## 2015-06-30 DIAGNOSIS — I82811 Embolism and thrombosis of superficial veins of right lower extremities: Secondary | ICD-10-CM | POA: Diagnosis present

## 2015-06-30 DIAGNOSIS — E785 Hyperlipidemia, unspecified: Secondary | ICD-10-CM | POA: Diagnosis present

## 2015-06-30 DIAGNOSIS — G4733 Obstructive sleep apnea (adult) (pediatric): Secondary | ICD-10-CM | POA: Diagnosis present

## 2015-06-30 DIAGNOSIS — I10 Essential (primary) hypertension: Secondary | ICD-10-CM | POA: Diagnosis present

## 2015-06-30 DIAGNOSIS — J90 Pleural effusion, not elsewhere classified: Secondary | ICD-10-CM | POA: Diagnosis present

## 2015-06-30 DIAGNOSIS — Z9119 Patient's noncompliance with other medical treatment and regimen: Secondary | ICD-10-CM

## 2015-06-30 DIAGNOSIS — Z87891 Personal history of nicotine dependence: Secondary | ICD-10-CM

## 2015-06-30 DIAGNOSIS — Y95 Nosocomial condition: Secondary | ICD-10-CM | POA: Diagnosis present

## 2015-06-30 DIAGNOSIS — Z9889 Other specified postprocedural states: Secondary | ICD-10-CM

## 2015-06-30 DIAGNOSIS — L03115 Cellulitis of right lower limb: Secondary | ICD-10-CM | POA: Diagnosis present

## 2015-06-30 DIAGNOSIS — E11319 Type 2 diabetes mellitus with unspecified diabetic retinopathy without macular edema: Secondary | ICD-10-CM | POA: Diagnosis present

## 2015-06-30 DIAGNOSIS — M7989 Other specified soft tissue disorders: Secondary | ICD-10-CM

## 2015-06-30 DIAGNOSIS — J9600 Acute respiratory failure, unspecified whether with hypoxia or hypercapnia: Secondary | ICD-10-CM | POA: Diagnosis present

## 2015-06-30 DIAGNOSIS — I129 Hypertensive chronic kidney disease with stage 1 through stage 4 chronic kidney disease, or unspecified chronic kidney disease: Secondary | ICD-10-CM | POA: Diagnosis present

## 2015-06-30 DIAGNOSIS — Z833 Family history of diabetes mellitus: Secondary | ICD-10-CM

## 2015-06-30 DIAGNOSIS — D631 Anemia in chronic kidney disease: Secondary | ICD-10-CM | POA: Diagnosis present

## 2015-06-30 DIAGNOSIS — L03314 Cellulitis of groin: Secondary | ICD-10-CM | POA: Diagnosis not present

## 2015-06-30 DIAGNOSIS — J45909 Unspecified asthma, uncomplicated: Secondary | ICD-10-CM | POA: Diagnosis present

## 2015-06-30 DIAGNOSIS — E1140 Type 2 diabetes mellitus with diabetic neuropathy, unspecified: Secondary | ICD-10-CM | POA: Diagnosis present

## 2015-06-30 LAB — GLUCOSE, CAPILLARY: GLUCOSE-CAPILLARY: 224 mg/dL — AB (ref 65–99)

## 2015-06-30 LAB — CBC WITH DIFFERENTIAL/PLATELET
BASOS ABS: 0 10*3/uL (ref 0.0–0.1)
BASOS PCT: 1 %
Eosinophils Absolute: 0.1 10*3/uL (ref 0.0–0.7)
Eosinophils Relative: 1 %
HEMATOCRIT: 33.6 % — AB (ref 39.0–52.0)
HEMOGLOBIN: 10.7 g/dL — AB (ref 13.0–17.0)
LYMPHS PCT: 17 %
Lymphs Abs: 1.2 10*3/uL (ref 0.7–4.0)
MCH: 28.8 pg (ref 26.0–34.0)
MCHC: 31.8 g/dL (ref 30.0–36.0)
MCV: 90.3 fL (ref 78.0–100.0)
MONOS PCT: 13 %
Monocytes Absolute: 0.9 10*3/uL (ref 0.1–1.0)
NEUTROS ABS: 4.8 10*3/uL (ref 1.7–7.7)
NEUTROS PCT: 68 %
PLATELETS: 263 10*3/uL (ref 150–400)
RBC: 3.72 MIL/uL — ABNORMAL LOW (ref 4.22–5.81)
RDW: 13.6 % (ref 11.5–15.5)
WBC: 7 10*3/uL (ref 4.0–10.5)

## 2015-06-30 LAB — BASIC METABOLIC PANEL
Anion gap: 6 (ref 5–15)
BUN: 17 mg/dL (ref 6–20)
CO2: 26 mmol/L (ref 22–32)
Calcium: 8 mg/dL — ABNORMAL LOW (ref 8.9–10.3)
Chloride: 102 mmol/L (ref 101–111)
Creatinine, Ser: 1.01 mg/dL (ref 0.61–1.24)
GFR calc Af Amer: >60 mL/min (ref >60)
GLUCOSE: 165 mg/dL — AB (ref 65–99)
POTASSIUM: 4 mmol/L (ref 3.5–5.1)
SODIUM: 134 mmol/L — AB (ref 135–145)

## 2015-06-30 LAB — PROCALCITONIN: Procalcitonin: 0.1 ng/mL

## 2015-06-30 LAB — D-DIMER, QUANTITATIVE: D-Dimer, Quant: 1.99 ug/mL-FEU — ABNORMAL HIGH (ref 0.00–0.50)

## 2015-06-30 LAB — BRAIN NATRIURETIC PEPTIDE: B Natriuretic Peptide: 168.6 pg/mL — ABNORMAL HIGH (ref 0.0–100.0)

## 2015-06-30 LAB — TSH: TSH: 1.919 u[IU]/mL (ref 0.350–4.500)

## 2015-06-30 MED ORDER — DEXTROSE 5 % IV SOLN
1.0000 g | Freq: Three times a day (TID) | INTRAVENOUS | Status: DC
  Administered 2015-06-30 – 2015-07-06 (×17): 1 g via INTRAVENOUS
  Filled 2015-06-30 (×18): qty 1

## 2015-06-30 MED ORDER — ONDANSETRON HCL 4 MG/2ML IJ SOLN: 4.0000 mg | Freq: Four times a day (QID) | INTRAMUSCULAR | Status: DC | PRN

## 2015-06-30 MED ORDER — FUROSEMIDE 40 MG PO TABS
40.0000 mg | ORAL_TABLET | Freq: Two times a day (BID) | ORAL | Status: DC
  Administered 2015-07-01: 40 mg via ORAL
  Filled 2015-06-30: qty 1

## 2015-06-30 MED ORDER — INSULIN ASPART 100 UNIT/ML ~~LOC~~ SOLN
0.0000 [IU] | Freq: Three times a day (TID) | SUBCUTANEOUS | Status: DC
  Administered 2015-07-01: 7 [IU] via SUBCUTANEOUS
  Administered 2015-07-01 (×2): 5 [IU] via SUBCUTANEOUS
  Administered 2015-07-02: 1 [IU] via SUBCUTANEOUS
  Administered 2015-07-03 – 2015-07-05 (×6): 2 [IU] via SUBCUTANEOUS
  Administered 2015-07-06 (×2): 1 [IU] via SUBCUTANEOUS
  Administered 2015-07-06: 2 [IU] via SUBCUTANEOUS
  Administered 2015-07-07 – 2015-07-09 (×5): 1 [IU] via SUBCUTANEOUS

## 2015-06-30 MED ORDER — ALBUTEROL SULFATE (2.5 MG/3ML) 0.083% IN NEBU
2.5000 mg | INHALATION_SOLUTION | RESPIRATORY_TRACT | Status: DC | PRN
  Administered 2015-07-01 – 2015-07-03 (×2): 2.5 mg via RESPIRATORY_TRACT
  Filled 2015-06-30 (×2): qty 3

## 2015-06-30 MED ORDER — ALBUTEROL SULFATE (2.5 MG/3ML) 0.083% IN NEBU
5.0000 mg | INHALATION_SOLUTION | Freq: Once | RESPIRATORY_TRACT | Status: AC
  Administered 2015-06-30: 5 mg via RESPIRATORY_TRACT
  Filled 2015-06-30: qty 6

## 2015-06-30 MED ORDER — METOPROLOL SUCCINATE ER 50 MG PO TB24
50.0000 mg | ORAL_TABLET | Freq: Two times a day (BID) | ORAL | Status: DC
  Administered 2015-06-30 – 2015-07-10 (×20): 50 mg via ORAL
  Filled 2015-06-30 (×20): qty 1

## 2015-06-30 MED ORDER — VANCOMYCIN HCL 10 G IV SOLR
2500.0000 mg | Freq: Once | INTRAVENOUS | Status: AC
  Administered 2015-06-30: 2500 mg via INTRAVENOUS
  Filled 2015-06-30: qty 2500

## 2015-06-30 MED ORDER — GABAPENTIN 300 MG PO CAPS
300.0000 mg | ORAL_CAPSULE | Freq: Three times a day (TID) | ORAL | Status: DC
  Administered 2015-06-30 – 2015-07-10 (×28): 300 mg via ORAL
  Filled 2015-06-30 (×28): qty 1

## 2015-06-30 MED ORDER — ONDANSETRON HCL 4 MG PO TABS: 4.0000 mg | ORAL_TABLET | Freq: Four times a day (QID) | ORAL | Status: DC | PRN

## 2015-06-30 MED ORDER — ASPIRIN 325 MG PO TABS
325.0000 mg | ORAL_TABLET | Freq: Every day | ORAL | Status: DC
  Administered 2015-07-01 – 2015-07-10 (×10): 325 mg via ORAL
  Filled 2015-06-30 (×10): qty 1

## 2015-06-30 MED ORDER — FLUOXETINE HCL 20 MG PO CAPS
40.0000 mg | ORAL_CAPSULE | Freq: Two times a day (BID) | ORAL | Status: DC
  Administered 2015-06-30 – 2015-07-10 (×20): 40 mg via ORAL
  Filled 2015-06-30 (×20): qty 2

## 2015-06-30 MED ORDER — DIPHENHYDRAMINE HCL 25 MG PO CAPS
25.0000 mg | ORAL_CAPSULE | Freq: Four times a day (QID) | ORAL | Status: DC | PRN
  Administered 2015-07-01: 25 mg via ORAL
  Filled 2015-06-30: qty 1

## 2015-06-30 MED ORDER — ALBUTEROL (5 MG/ML) CONTINUOUS INHALATION SOLN
10.0000 mg/h | INHALATION_SOLUTION | Freq: Once | RESPIRATORY_TRACT | Status: AC
  Administered 2015-06-30: 10 mg/h via RESPIRATORY_TRACT
  Filled 2015-06-30: qty 20

## 2015-06-30 MED ORDER — METHYLPREDNISOLONE SODIUM SUCC 125 MG IJ SOLR
125.0000 mg | Freq: Once | INTRAMUSCULAR | Status: AC
  Administered 2015-06-30: 125 mg via INTRAVENOUS
  Filled 2015-06-30: qty 2

## 2015-06-30 MED ORDER — SODIUM CHLORIDE 0.9 % IV SOLN
INTRAVENOUS | Status: DC
  Administered 2015-06-30: 18:00:00 via INTRAVENOUS

## 2015-06-30 MED ORDER — ACETAMINOPHEN 650 MG RE SUPP: 650.0000 mg | Freq: Four times a day (QID) | RECTAL | Status: DC | PRN

## 2015-06-30 MED ORDER — INSULIN NPH (HUMAN) (ISOPHANE) 100 UNIT/ML ~~LOC~~ SUSP
29.0000 [IU] | Freq: Two times a day (BID) | SUBCUTANEOUS | Status: DC
  Administered 2015-07-01: 29 [IU] via SUBCUTANEOUS
  Filled 2015-06-30: qty 10

## 2015-06-30 MED ORDER — AMLODIPINE BESYLATE 10 MG PO TABS
10.0000 mg | ORAL_TABLET | Freq: Every day | ORAL | Status: DC
  Administered 2015-07-01 – 2015-07-10 (×11): 10 mg via ORAL
  Filled 2015-06-30 (×11): qty 1

## 2015-06-30 MED ORDER — LEVOFLOXACIN IN D5W 750 MG/150ML IV SOLN
750.0000 mg | Freq: Once | INTRAVENOUS | Status: AC
  Administered 2015-06-30: 750 mg via INTRAVENOUS
  Filled 2015-06-30: qty 150

## 2015-06-30 MED ORDER — LEVOFLOXACIN IN D5W 750 MG/150ML IV SOLN
750.0000 mg | INTRAVENOUS | Status: DC
  Administered 2015-07-01 – 2015-07-02 (×2): 750 mg via INTRAVENOUS
  Filled 2015-06-30 (×2): qty 150

## 2015-06-30 MED ORDER — ACETAMINOPHEN 325 MG PO TABS: 650.0000 mg | ORAL_TABLET | Freq: Four times a day (QID) | ORAL | Status: DC | PRN

## 2015-06-30 MED ORDER — POTASSIUM 99 MG PO TABS: 99.0000 mg | ORAL_TABLET | Freq: Two times a day (BID) | ORAL | Status: DC | PRN

## 2015-06-30 MED ORDER — ENOXAPARIN SODIUM 80 MG/0.8ML ~~LOC~~ SOLN
65.0000 mg | Freq: Every day | SUBCUTANEOUS | Status: DC
  Administered 2015-06-30: 65 mg via SUBCUTANEOUS
  Filled 2015-06-30: qty 0.8

## 2015-06-30 MED ORDER — CLONIDINE HCL 0.1 MG PO TABS
0.1000 mg | ORAL_TABLET | Freq: Two times a day (BID) | ORAL | Status: DC
  Administered 2015-06-30 – 2015-07-10 (×20): 0.1 mg via ORAL
  Filled 2015-06-30 (×20): qty 1

## 2015-06-30 MED ORDER — VANCOMYCIN HCL 10 G IV SOLR
1250.0000 mg | Freq: Three times a day (TID) | INTRAVENOUS | Status: DC
  Administered 2015-07-01 – 2015-07-03 (×6): 1250 mg via INTRAVENOUS
  Filled 2015-06-30 (×8): qty 1250

## 2015-06-30 NOTE — ED Notes (Addendum)
Respiratory paged for CAT. Charge RN to attempt IV.

## 2015-06-30 NOTE — ED Provider Notes (Signed)
CSN: [id number]     Arrival date & time 06/30/15  1654 History   First MD Initiated Contact with Patient 06/30/15 1727     Chief Complaint  Patient presents with  . Shortness of Breath  . Asthma     (Consider location/radiation/quality/duration/timing/severity/associated sxs/prior Treatment) HPI Comments: Patient here complaining of two-day history of increasing asthma. Has not been his inhaler and complains of increased dyspnea without cough without fever or angina or CHF symptoms. Trigger has been from environmental allergies. Denies any leg pain or swelling. No medications used prior to arrival in arrival here he had audible wheezing with a pulse oximetry of 83%.  Patient is a 45 y.o. male presenting with shortness of breath and asthma. The history is provided by the patient.  Shortness of Breath Asthma Associated symptoms include shortness of breath.    Past Medical History  Diagnosis Date  . Hypertension   . Anxiety   . Shingles 2010  . Allergic rhinitis   . Diabetes mellitus     sees Dr. Jacelyn Pi   . Retinopathy of both eyes     sees Dr. Zadie Rhine   . Neuropathy in diabetes ([place])   . Cellulitis     legs - hx  . OSA (obstructive sleep apnea)     CPAP- not wearing  . Recurrent upper respiratory infection (URI)     frequent bronchcitis  . H/O hiatal hernia     pt thinks so  . Headache(784.0)     had migraine- not in years  . Retinopathy     right eye  . Asthma   . Chronic kidney disease   . Bladder infection     in June -Was on Cipro  . Heart murmur     Followed by Dr Stanford Breed  . Hyperlipidemia    Past Surgical History  Procedure Laterality Date  . Tonsillectomy  1983  . Eye surgery  2012    Cataract with lens  Right  . Pars plana vitrectomy w/ scleral buckle  09/01/2011    Procedure: PARS PLANA VITRECTOMY WITH LASER FOR MACULAR HOLE;  Surgeon: Hurman Horn, MD;  Location: [facility];  Service: Ophthalmology;  Laterality: Right;  Pars Plana Vitrectomy with  Laser and Membrane Peel; Insertion Silicone Oil  . Pars plana vitrectomy  12/04/2011    Procedure: PARS PLANA VITRECTOMY WITH 25 GAUGE;  Surgeon: Hurman Horn, MD;  Location: [facility];  Service: Ophthalmology;  Laterality: Left;  Insertion Silicon Oil [id number] CS and    . Vitrectomy  2013    per Dr. Zadie Rhine   Family History  Problem Relation Age of Onset  . Diabetes Mother   . Transient ischemic attack Father   . Cancer Father   . Anesthesia problems Neg Hx   . Diabetes Maternal Uncle   . Diabetes Maternal Grandmother   . Heart disease Maternal Grandfather    Social History  Substance Use Topics  . Smoking status: Former Smoker -- 0.30 packs/day for 29 years    Quit date: 08/16/2009  . Smokeless tobacco: Former Systems developer     Comment: started at age 76.  smoked to 30 or 6 cigs a day.   . Alcohol Use: No    Review of Systems  Respiratory: Positive for shortness of breath.   All other systems reviewed and are negative.     Allergies  Codeine; Erythromycin; Other; and Soap  Home Medications   Prior to Admission medications   Medication Sig Start Date  End Date Taking? Authorizing Provider  albuterol (PROVENTIL HFA;VENTOLIN HFA) 108 (90 BASE) MCG/ACT inhaler Inhale 2 puffs into the lungs every 4 (four) hours as needed for wheezing or shortness of breath. PRN:  Allergies/breathing 06/20/12   Laurey Morale, MD  Alcohol Swabs PADS Use up to 10 times per day for testing and insulin injections, diagnosis code is 250.00 Patient not taking: Reported on 06/17/2015 02/01/13   Laurey Morale, MD  amLODipine (NORVASC) 10 MG tablet Take 1 tablet (10 mg total) by mouth daily. 05/09/15   Donne Hazel, MD  aspirin 325 MG tablet Take 325 mg by mouth daily. Reported on 05/29/2015    Historical Provider, MD  cephALEXin (KEFLEX) 500 MG capsule Take 1 capsule (500 mg total) by mouth 4 (four) times daily. 06/24/15 07/04/15  Laurey Morale, MD  cloNIDine (CATAPRES) 0.1 MG tablet Take 1 tablet (0.1 mg total) by mouth 2  (two) times daily. 06/13/15   Laurey Morale, MD  diphenhydrAMINE (BENADRYL) 25 MG tablet Take 25 mg by mouth every 6 (six) hours as needed.    Historical Provider, MD  FLUoxetine (PROZAC) 40 MG capsule Take 1 capsule (40 mg total) by mouth daily. 05/29/14   Laurey Morale, MD  furosemide (LASIX) 40 MG tablet Take 2 tablets (80 mg total) by mouth 2 (two) times daily. 06/24/15   Laurey Morale, MD  gabapentin (NEURONTIN) 300 MG capsule Take 1 capsule (300 mg total) by mouth 3 (three) times daily. 12/13/13 05/05/16  Laurey Morale, MD  insulin NPH Human (HUMULIN N) 100 UNIT/ML injection Inject 0.25 mLs (25 Units total) into the skin 2 (two) times daily before a meal. Patient taking differently: Inject 29 Units into the skin 2 (two) times daily before a meal.  05/09/15   Donne Hazel, MD  insulin regular (HUMULIN R) 100 units/mL injection Inject 0.04 mLs (4 Units total) into the skin 3 (three) times daily before meals. Patient taking differently: Inject 4 Units into the skin 3 (three) times daily before meals. Takes at lunch 05/09/15   Donne Hazel, MD  metoprolol succinate (TOPROL-XL) 50 MG 24 hr tablet Take 1 tablet (50 mg total) by mouth daily. Take with or immediately following a meal. 05/21/15   Laurey Morale, MD  Needles & Syringes MISC Inject 2 ml intramuscular every 28 days. Patient not taking: Reported on 06/17/2015 07/26/14   Laurey Morale, MD  potassium chloride (KLOR-CON 10) 10 MEQ tablet Take 2 tablets (20 mEq total) by mouth 2 (two) times daily. 06/24/15   Laurey Morale, MD   BP 160/76 mmHg  Pulse 95  Temp(Src) 98.4 F (36.9 C) (Oral)  Resp 18  SpO2 86% Physical Exam  Constitutional: He is oriented to person, place, and time. He appears well-developed and well-nourished.  Non-toxic appearance. No distress.  HENT:  Head: Normocephalic and atraumatic.  Eyes: Conjunctivae, EOM and lids are normal. Pupils are equal, round, and reactive to light.  Neck: Normal range of motion. Neck supple. No  tracheal deviation present. No thyroid mass present.  Cardiovascular: Normal rate, regular rhythm and normal heart sounds.  Exam reveals no gallop.   No murmur heard. Pulmonary/Chest: Effort normal. No stridor. No respiratory distress. He has decreased breath sounds. He has wheezes. He has no rhonchi. He has no rales.  Abdominal: Soft. Normal appearance and bowel sounds are normal. He exhibits no distension. There is no tenderness. There is no rebound and no CVA tenderness.  Musculoskeletal:  Normal range of motion. He exhibits no edema or tenderness.  Neurological: He is alert and oriented to person, place, and time. He has normal strength. No cranial nerve deficit or sensory deficit. GCS eye subscore is 4. GCS verbal subscore is 5. GCS motor subscore is 6.  Skin: Skin is warm and dry. No abrasion and no rash noted.  Psychiatric: He has a normal mood and affect. His speech is normal and behavior is normal.  Nursing note and vitals reviewed.   ED Course  Procedures (including critical care time) Labs Review Labs Reviewed  CBC WITH DIFFERENTIAL/PLATELET  BASIC METABOLIC PANEL    Imaging Review No results found. I have personally reviewed and evaluated these images and lab results as part of my medical decision-making.   EKG Interpretation   Date/Time:  Sunday June 30 2015 17:16:16 EST Ventricular Rate:  100 PR Interval:  159 QRS Duration: 77 QT Interval:  332 QTC Calculation: 428 R Axis:   88 Text Interpretation:  Sinus tachycardia Confirmed by Zenia Resides  MD, Wissam Resor  ([id number]) on 06/30/2015 7:26:58 PM      MDM   Final diagnoses:  None    Patient given albuterol 10 mg continuous treatment  along with Solu-Medrol. Patient's chest x-ray consistent with pneumonia and patient started on Levaquin. Patient reassessed and breathing better. He is requiring oxygen at this time at 2 L per nasal cannula. He will be admitted to the hospitalist service    Lacretia Leigh, MD 06/30/15  [phone]

## 2015-06-30 NOTE — ED Notes (Signed)
Patient presents for asthma attack x2 days. Out of inhaler, no relief with albuterol treatment at home. O2 sats in triage 83. Audible wheezing in triage.

## 2015-06-30 NOTE — H&P (Signed)
[facility] History and Physical  Alex Ryan [id number] DOB: January 16, 1971 DOA: 06/30/2015  Referring physician: Alroy Dust. PCP: [name], MD  Specialists: Dr.Kumar. Endocrinologist.  Chief Complaint: Shortness of breath.  HPI: KUTLER GUPTA is a 45 y.o. male with history of diabetes mellitus type 2, hypertension, OSA and chronic anemia presents with a year because of increasing shortness of breath. Patient has been having shortness of breath over the last 2-3 days. Has been having some productive cough. Denies any fever or chills. Patient was recently admitted in December last 2 months ago for sepsis secondary to cellulitis. Since then patient has been on multiple doses of antibiotics for lower extremity cellulitis. Patient's Lasix also was recently increased by patient's primary care physician last week for increasing lower extremity edema and scrotal edema. Patient states he only had taken that dose of 80 mg of Lasix twice a day only one day but changed back to 40 twice a day which she was taking previously. He is unable to take the higher dose. Patient denies any chest pain. Chest x-ray shows pleural effusion and pneumonia. Patient has been admitted for further management.   Review of Systems: As presented in the history of presenting illness, rest negative.  Past Medical History  Diagnosis Date  . Hypertension   . Anxiety   . Shingles 2010  . Allergic rhinitis   . Diabetes mellitus     sees Dr. Dorisann Frames   . Retinopathy of both eyes     sees Dr. Luciana Axe   . Neuropathy in diabetes ([facility])   . Cellulitis     legs - hx  . OSA (obstructive sleep apnea)     CPAP- not wearing  . Recurrent upper respiratory infection (URI)     frequent bronchcitis  . H/O hiatal hernia     pt thinks so  . Headache(784.0)     had migraine- not in years  . Retinopathy     right eye  . Asthma   . Chronic kidney disease   . Bladder infection     in June -Was on Cipro  . Heart murmur      Followed by Dr Jens Som  . Hyperlipidemia    Past Surgical History  Procedure Laterality Date  . Tonsillectomy  1983  . Eye surgery  2012    Cataract with lens  Right  . Pars plana vitrectomy w/ scleral buckle  09/01/2011    Procedure: PARS PLANA VITRECTOMY WITH LASER FOR MACULAR HOLE;  Surgeon: Edmon Crape, MD;  Location: [facility];  Service: Ophthalmology;  Laterality: Right;  Pars Plana Vitrectomy with Laser and Membrane Peel; Insertion Silicone Oil  . Pars plana vitrectomy  12/04/2011    Procedure: PARS PLANA VITRECTOMY WITH 25 GAUGE;  Surgeon: Edmon Crape, MD;  Location: [facility] OR;  Service: Ophthalmology;  Laterality: Left;  Insertion Silicon Oil 5000 CS and    . Vitrectomy  2013    per Dr. Luciana Axe   Social History:  reports that he quit smoking about 5 years ago. He has quit using smokeless tobacco. He reports that he does not drink alcohol or use illicit drugs. Where does patient live home. Can patient participate in ADLs? Yes.  Allergies  Allergen Reactions  . Codeine Hives    Hyper  . Erythromycin Nausea And Vomiting  . Other     Pre-op cleaning wipes:. itching  . Soap Itching    "dial soap"    Family History:  Family History  Problem Relation Age of Onset  . Diabetes Mother   . Transient ischemic attack Father   . Cancer Father   . Anesthesia problems Neg Hx   . Diabetes Maternal Uncle   . Diabetes Maternal Grandmother   . Heart disease Maternal Grandfather       Prior to Admission medications   Medication Sig Start Date End Date Taking? Authorizing Provider  albuterol (PROVENTIL HFA;VENTOLIN HFA) 108 (90 BASE) MCG/ACT inhaler Inhale 2 puffs into the lungs every 4 (four) hours as needed for wheezing or shortness of breath. PRN:  Allergies/breathing 06/20/12  Yes [name], MD  Alcohol Swabs PADS Use up to 10 times per day for testing and insulin injections, diagnosis code is 250.00 02/01/13  Yes [name], MD  amLODipine (NORVASC) 10 MG tablet Take 1  tablet (10 mg total) by mouth daily. 05/09/15  Yes Jerald Kief, MD  aspirin 325 MG tablet Take 325 mg by mouth daily. Reported on 05/29/2015   Yes Historical Provider, MD  cephALEXin (KEFLEX) 500 MG capsule Take 1 capsule (500 mg total) by mouth 4 (four) times daily. 06/24/15 07/04/15 Yes [name], MD  cloNIDine (CATAPRES) 0.1 MG tablet Take 1 tablet (0.1 mg total) by mouth 2 (two) times daily. 06/13/15  Yes [name], MD  diphenhydrAMINE (BENADRYL) 25 MG tablet Take 25 mg by mouth every 6 (six) hours as needed for allergies.    Yes Historical Provider, MD  FLUoxetine (PROZAC) 40 MG capsule Take 1 capsule (40 mg total) by mouth daily. Patient taking differently: Take 40 mg by mouth 2 (two) times daily.  05/29/14  Yes [name], MD  furosemide (LASIX) 40 MG tablet Take 2 tablets (80 mg total) by mouth 2 (two) times daily. Patient taking differently: Take 40 mg by mouth 2 (two) times daily.  06/24/15  Yes [name], MD  insulin NPH Human (HUMULIN N) 100 UNIT/ML injection Inject 0.25 mLs (25 Units total) into the skin 2 (two) times daily before a meal. Patient taking differently: Inject 29 Units into the skin 2 (two) times daily before a meal.  05/09/15  Yes Jerald Kief, MD  insulin regular (HUMULIN R) 100 units/mL injection Inject 0.04 mLs (4 Units total) into the skin 3 (three) times daily before meals. Patient taking differently: Inject 4 Units into the skin 3 (three) times daily before meals. Takes at lunch 05/09/15  Yes Jerald Kief, MD  metoprolol succinate (TOPROL-XL) 50 MG 24 hr tablet Take 1 tablet (50 mg total) by mouth daily. Take with or immediately following a meal. Patient taking differently: Take 50 mg by mouth 2 (two) times daily. Take with or immediately following a meal. 05/21/15  Yes [name], MD  Needles & Syringes MISC Inject 2 ml intramuscular every 28 days. 07/26/14  Yes [name], MD  Potassium 99 MG TABS Take 99-396 mg by mouth 2 (two) times daily as  needed (for low potassium).   Yes Historical Provider, MD  gabapentin (NEURONTIN) 300 MG capsule Take 1 capsule (300 mg total) by mouth 3 (three) times daily. Patient not taking: Reported on 06/30/2015 12/13/13 05/05/16  [name], MD  potassium chloride (KLOR-CON 10) 10 MEQ tablet Take 2 tablets (20 mEq total) by mouth 2 (two) times daily. Patient not taking: Reported on 06/30/2015 06/24/15   [name], MD    Physical Exam: Filed Vitals:   06/30/15 1900 06/30/15 1930 06/30/15 2000 06/30/15 2126  BP: 171/57 178/58 151/55 166/64  Pulse: 120 122 116 112  Temp:    98.6 F (37 C)  TempSrc:    Oral  Resp: 28 25 28 20   Height:    5\' 10"  (1.778 m)  Weight:    133.085 kg (293 lb 6.4 oz)  SpO2: 98% 94% 92% 95%     General:  Moderately built and nourished.  Eyes: Anicteric no pallor.  ENT: No discharge from the ears eyes nose mouth.  Neck: No JVD appreciated.  Cardiovascular: S1-S2 heard.  Respiratory: No rhonchi or crepitations.  Abdomen: Distended nontender bowel sounds present.  Skin: Has dressing on the right lower extremity.  Musculoskeletal: Mild edema the lower extremities.  Psychiatric: Appears normal.  Neurologic: Alert awake oriented to time place and person. Moves all extremities.  Labs on Admission:  Basic Metabolic Panel:  Recent Labs Lab 06/30/15 1810  NA 134*  K 4.0  CL 102  CO2 26  GLUCOSE 165*  BUN 17  CREATININE 1.01  CALCIUM 8.0*   Liver Function Tests: No results for input(s): AST, ALT, ALKPHOS, BILITOT, PROT, ALBUMIN in the last 168 hours. No results for input(s): LIPASE, AMYLASE in the last 168 hours. No results for input(s): AMMONIA in the last 168 hours. CBC:  Recent Labs Lab 06/30/15 1810  WBC 7.0  NEUTROABS 4.8  HGB 10.7*  HCT 33.6*  MCV 90.3  PLT 263   Cardiac Enzymes: No results for input(s): CKTOTAL, CKMB, CKMBINDEX, TROPONINI in the last 168 hours.  BNP (last 3 results) No results for input(s): BNP in the last 8760  hours.  ProBNP (last 3 results) No results for input(s): PROBNP in the last 8760 hours.  CBG: No results for input(s): GLUCAP in the last 168 hours.  Radiological Exams on Admission: Dg Chest 2 View  06/30/2015  CLINICAL DATA:  Cough, shortness of breath EXAM: CHEST  2 VIEW COMPARISON:  12/04/2011 FINDINGS: Multifocal patchy opacities in the bilateral upper lobes (right greater than left) and bilateral lower lobes, suspicious for pneumonia. Suspected small right pleural effusion. No pneumothorax. The heart is normal in size. Mild degenerative changes of the visualized thoracolumbar spine. IMPRESSION: Multifocal patchy opacities, suspicious for pneumonia. Suspected small right pleural effusion. Electronically Signed   By: [name] M.D.   On: 06/30/2015 17:52     Assessment/Plan Active Problems:   Essential hypertension   OSA (obstructive sleep apnea)   CAP (community acquired pneumonia)   Acute respiratory failure with hypoxia (HCC)   Diabetes mellitus type 2, insulin dependent (HCC)   Acute respiratory failure (HCC)   HCAP (healthcare-associated pneumonia)   1. Acute respiratory failure with hypoxia - probably from pneumonia. However given the pleural effusion suspect patient may have some cupboard of fluid. Will check BNP and d-dimer and if d-dimer is positive we will get CT angiogram of the chest. I have placed patient on vancomycin and cefepime for healthcare associated pneumonia and add Levaquin for atypicals. Follow influenza PCR urine for strep and legionella antigen and HIV status. Follow sputum cultures. 2. Lower extremity edema - continue Lasix. Close to monitor intake output. Check BNP. 3. Diabetes mellitus type 2 - as per her recent endocrinologist notes patient's blood sugars improved after patient was compliant with diet. Follow CBGs with sliding scale coverage and patient is on NPH. 4. Hypertension - continue home medications. 5. OSA on CPAP. 6. Chronic anemia -  follow CBC. 7. Chronic lower extremity cellulitis with dressing of the right lower extremity - wound  team consult.   DVT Prophylaxis Lovenox.  Code Status: Full code.   Family Communication: Patient's wife at the bedside.  Disposition Plan: Admit to inpatient.    Len Kluver N. [facility] Pager [phone].  If 7PM-7AM, please contact night-coverage [email or web address] Password [facility] 06/30/2015, 10:19 PM

## 2015-06-30 NOTE — Progress Notes (Signed)
ANTIBIOTIC CONSULT NOTE - INITIAL  Pharmacy Consult for Vancomycin, Levaquin  Indication: pneumonia  Allergies  Allergen Reactions  . Codeine Hives    Hyper  . Erythromycin Nausea And Vomiting  . Other     Pre-op cleaning wipes:. itching  . Soap Itching    "dial soap"    Patient Measurements: Height: 5\' 10"  (177.8 cm) Weight: 293 lb 6.4 oz (133.085 kg) IBW/kg (Calculated) : 73  Vital Signs: Temp: 98.6 F (37 C) (02/12 2126) Temp Source: Oral (02/12 2126) BP: 166/64 mmHg (02/12 2126) Pulse Rate: 112 (02/12 2126) Intake/Output from previous day:   Intake/Output from this shift:    Labs:  Recent Labs  06/30/15 1810  WBC 7.0  HGB 10.7*  PLT 263  CREATININE 1.01   Estimated Creatinine Clearance: 126.7 mL/min (by C-G formula based on Cr of 1.01). No results for input(s): VANCOTROUGH, VANCOPEAK, VANCORANDOM, GENTTROUGH, GENTPEAK, GENTRANDOM, TOBRATROUGH, TOBRAPEAK, TOBRARND, AMIKACINPEAK, AMIKACINTROU, AMIKACIN in the last 72 hours.   Microbiology: No results found for this or any previous visit (from the past 720 hour(s)).  Medical History: Past Medical History  Diagnosis Date  . Hypertension   . Anxiety   . Shingles 2010  . Allergic rhinitis   . Diabetes mellitus     sees Dr. Jacelyn Pi   . Retinopathy of both eyes     sees Dr. Zadie Rhine   . Neuropathy in diabetes ([place])   . Cellulitis     legs - hx  . OSA (obstructive sleep apnea)     CPAP- not wearing  . Recurrent upper respiratory infection (URI)     frequent bronchcitis  . H/O hiatal hernia     pt thinks so  . Headache(784.0)     had migraine- not in years  . Retinopathy     right eye  . Asthma   . Chronic kidney disease   . Bladder infection     in June -Was on Cipro  . Heart murmur     Followed by Dr Stanford Breed  . Hyperlipidemia     Medications:  Scheduled:  . [START ON 07/01/2015] amLODipine  10 mg Oral Daily  . [START ON 07/01/2015] aspirin  325 mg Oral Daily  . ceFEPime (MAXIPIME)  IV  1 g Intravenous 3 times per day  . cloNIDine  0.1 mg Oral BID  . enoxaparin (LOVENOX) injection  65 mg Subcutaneous QHS  . FLUoxetine  40 mg Oral BID  . [START ON 07/01/2015] furosemide  40 mg Oral BID  . gabapentin  300 mg Oral TID  . [START ON 07/01/2015] insulin aspart  0-9 Units Subcutaneous TID WC  . [START ON 07/01/2015] insulin NPH Human  29 Units Subcutaneous BID AC  . [START ON 07/01/2015] levofloxacin (LEVAQUIN) IV  750 mg Intravenous Q24H  . metoprolol succinate  50 mg Oral BID  . [START ON 07/01/2015] vancomycin  1,250 mg Intravenous Q8H  . vancomycin  2,500 mg Intravenous Once   Infusions:   Assessment:  45 yr male with report of asthma attack x 2 days.  Chest Xray noted as consisted with pneumonia  Pharmacy consulted to dose for pnemonia: Vancomycin, Levaquin and adjust other antibiotics as needed based on renal function  Patient weight = 133 kg  CrCl (n) = 94 ml/min    2/12 >>Vanc >> 2/12 >>Levaquin >>  2/12 >> Cefepime >>   2/12 sputum:   Trough/Dose change info:   Goal of Therapy:  Vancomycin trough level 15-20 mcg/ml  Plan:  Measure antibiotic drug levels at steady state Follow up culture results   Levaquin 750mg  IV q24h  Vancomycin 2500mg  IV x 1 loading dose followed by 1250mg  IV q8h  Cefepime 1gm IV q8h per MD - continue as ordered  Alex Ryan, Toribio Harbour, PharmD 06/30/2015,10:29 PM

## 2015-07-01 ENCOUNTER — Inpatient Hospital Stay (HOSPITAL_COMMUNITY): Payer: Medicaid Other

## 2015-07-01 ENCOUNTER — Other Ambulatory Visit (HOSPITAL_COMMUNITY): Payer: Self-pay

## 2015-07-01 ENCOUNTER — Encounter (HOSPITAL_COMMUNITY): Payer: Self-pay | Admitting: *Deleted

## 2015-07-01 DIAGNOSIS — E119 Type 2 diabetes mellitus without complications: Secondary | ICD-10-CM

## 2015-07-01 DIAGNOSIS — M7989 Other specified soft tissue disorders: Secondary | ICD-10-CM

## 2015-07-01 DIAGNOSIS — Z794 Long term (current) use of insulin: Secondary | ICD-10-CM

## 2015-07-01 LAB — COMPREHENSIVE METABOLIC PANEL
ALK PHOS: 183 U/L — AB (ref 38–126)
ALT: 18 U/L (ref 17–63)
AST: 38 U/L (ref 15–41)
Albumin: 2.5 g/dL — ABNORMAL LOW (ref 3.5–5.0)
Anion gap: 8 (ref 5–15)
BUN: 23 mg/dL — ABNORMAL HIGH (ref 6–20)
CALCIUM: 7.8 mg/dL — AB (ref 8.9–10.3)
CO2: 25 mmol/L (ref 22–32)
CREATININE: 1.12 mg/dL (ref 0.61–1.24)
Chloride: 101 mmol/L (ref 101–111)
Glucose, Bld: 367 mg/dL — ABNORMAL HIGH (ref 65–99)
Potassium: 4.9 mmol/L (ref 3.5–5.1)
Sodium: 134 mmol/L — ABNORMAL LOW (ref 135–145)
TOTAL PROTEIN: 6.4 g/dL — AB (ref 6.5–8.1)
Total Bilirubin: 0.9 mg/dL (ref 0.3–1.2)

## 2015-07-01 LAB — CBC WITH DIFFERENTIAL/PLATELET
BASOS PCT: 0 %
Basophils Absolute: 0 10*3/uL (ref 0.0–0.1)
EOS ABS: 0 10*3/uL (ref 0.0–0.7)
Eosinophils Relative: 0 %
HCT: 32.2 % — ABNORMAL LOW (ref 39.0–52.0)
HEMOGLOBIN: 10 g/dL — AB (ref 13.0–17.0)
Lymphocytes Relative: 11 %
Lymphs Abs: 0.5 10*3/uL — ABNORMAL LOW (ref 0.7–4.0)
MCH: 28.8 pg (ref 26.0–34.0)
MCHC: 31.1 g/dL (ref 30.0–36.0)
MCV: 92.8 fL (ref 78.0–100.0)
Monocytes Absolute: 0.1 10*3/uL (ref 0.1–1.0)
Monocytes Relative: 3 %
NEUTROS PCT: 86 %
Neutro Abs: 4 10*3/uL (ref 1.7–7.7)
Platelets: 276 10*3/uL (ref 150–400)
RBC: 3.47 MIL/uL — AB (ref 4.22–5.81)
RDW: 14 % (ref 11.5–15.5)
WBC: 4.6 10*3/uL (ref 4.0–10.5)

## 2015-07-01 LAB — INFLUENZA PANEL BY PCR (TYPE A & B)
H1N1FLUPCR: NOT DETECTED
Influenza A By PCR: NEGATIVE
Influenza B By PCR: NEGATIVE

## 2015-07-01 LAB — HIV ANTIBODY (ROUTINE TESTING W REFLEX): HIV SCREEN 4TH GENERATION: NONREACTIVE

## 2015-07-01 LAB — STREP PNEUMONIAE URINARY ANTIGEN: STREP PNEUMO URINARY ANTIGEN: NEGATIVE

## 2015-07-01 LAB — GLUCOSE, CAPILLARY
GLUCOSE-CAPILLARY: 305 mg/dL — AB (ref 65–99)
GLUCOSE-CAPILLARY: 296 mg/dL — AB (ref 65–99)
Glucose-Capillary: 281 mg/dL — ABNORMAL HIGH (ref 65–99)

## 2015-07-01 MED ORDER — LORAZEPAM 2 MG/ML IJ SOLN
0.5000 mg | Freq: Once | INTRAMUSCULAR | Status: AC
  Administered 2015-07-01: 0.5 mg via INTRAVENOUS
  Filled 2015-07-01: qty 1

## 2015-07-01 MED ORDER — ENOXAPARIN SODIUM 60 MG/0.6ML ~~LOC~~ SOLN
60.0000 mg | Freq: Every day | SUBCUTANEOUS | Status: DC
  Administered 2015-07-01 – 2015-07-09 (×9): 60 mg via SUBCUTANEOUS
  Filled 2015-07-01 (×9): qty 0.6

## 2015-07-01 MED ORDER — FUROSEMIDE 10 MG/ML IJ SOLN: 40.0000 mg | Freq: Two times a day (BID) | INTRAMUSCULAR | Status: DC

## 2015-07-01 MED ORDER — FUROSEMIDE 10 MG/ML IJ SOLN
60.0000 mg | Freq: Two times a day (BID) | INTRAMUSCULAR | Status: DC
  Administered 2015-07-01 – 2015-07-02 (×2): 60 mg via INTRAVENOUS
  Filled 2015-07-01 (×2): qty 6

## 2015-07-01 MED ORDER — GUAIFENESIN-DM 100-10 MG/5ML PO SYRP
5.0000 mL | ORAL_SOLUTION | Freq: Four times a day (QID) | ORAL | Status: DC
  Administered 2015-07-01 – 2015-07-10 (×35): 5 mL via ORAL
  Filled 2015-07-01 (×35): qty 10

## 2015-07-01 MED ORDER — INSULIN NPH (HUMAN) (ISOPHANE) 100 UNIT/ML ~~LOC~~ SUSP
35.0000 [IU] | Freq: Two times a day (BID) | SUBCUTANEOUS | Status: DC
Start: 2015-07-01 — End: 2015-07-10
  Administered 2015-07-01 – 2015-07-10 (×16): 35 [IU] via SUBCUTANEOUS
  Filled 2015-07-01 (×2): qty 10

## 2015-07-01 MED ORDER — IOHEXOL 350 MG/ML SOLN
100.0000 mL | Freq: Once | INTRAVENOUS | Status: AC | PRN
  Administered 2015-07-01: 100 mL via INTRAVENOUS

## 2015-07-01 NOTE — Progress Notes (Addendum)
*  Preliminary Results* Bilateral lower extremity venous duplex completed. Study was technically limited due to patient body habitus and edema. Visualized veins of bilateral lower extremities are negative for deep vein thrombosis. There is evidence of acute superficial vein thrombosis involving a small segment of a varicose vein in the right medial proximal calf. There is no evidence of Baker's cyst bilaterally.  Incidental finding: there is evidence of a heterogenous area of the right groin measuring 4.5cm, suggestive of a possible enlarged inguinal lymph node.  Preliminary finding of superficial thrombus discussed with Joellen Jersey, RN.  07/01/2015  Maudry Mayhew, RVT, RDCS, RDMS

## 2015-07-01 NOTE — Progress Notes (Signed)
Patient stated that he could not wear an actual nasal mask at this time. RT encouraged patient to bring home mask from home. RT will continue to monitor.

## 2015-07-01 NOTE — Progress Notes (Addendum)
[facility] PROGRESS NOTE  Alex Ryan [id number] DOB: 11-27-1970 DOA: 06/30/2015 PCP: Laurey Morale, MD  Length of stay: 1   Assessment/Plan: Principal Problem:   Acute respiratory failure with hypoxia ([place]) Active Problems:   Essential hypertension   OSA (obstructive sleep apnea)   CAP (community acquired pneumonia)   Diabetes mellitus type 2, insulin dependent ([place])   Acute respiratory failure ([place])   HCAP (healthcare-associated pneumonia)   Brief summary 45 y.o. male with history of diabetes mellitus type 2, hypertension, OSA and chronic anemia presents with a year because of increasing shortness of breath. Patient has been having shortness of breath over the last 2-3 days. Has been having some productive cough. Denies any fever or chills. Patient was recently admitted in December last 2 months ago for sepsis secondary to cellulitis. Since then patient has been on multiple doses of antibiotics for lower extremity cellulitis. Patient's Lasix also was recently increased by patient's primary care physician last week for increasing lower extremity edema and scrotal edema. Patient states he only had taken that dose of 80 mg of Lasix twice a day only one day but changed back to 40 twice a day which he was taking previously.  Chest x-ray shows pleural effusion and pneumonia. CT of the chest showed bilateral patchy upper lobe infiltrates with probable reactive mediastinal adenopathy, large bilateral pleural effusions  Assessment and plan   Acute respiratory failure with hypoxia -secondary to pneumonia, bilateral pleural effusions on CT results. Continue treatment for pneumonia, continue treatment for bilateral pleural effusions and patient has been initiated on 60 mg IV Lasix twice a day.. Continue current antibiotics. Negative influenza PCR urine for strep negative and legionella antigen pending and HIV status. Follow sputum cultures.  Lower extremity edema - continue  Lasix. Close to monitor intake output. Check BNP. check Doppler to rule out DVT  Diabetes mellitus type 2 - most recent hemoglobin A1c greater than 12. Patient is on Humulin N and sliding scale insulin. Follow CBGs with sliding scale coverage.  Hypertension - continue home medications.  OSA on CPAP.  Chronic anemia - follow CBC.  Chronic lower extremity cellulitis with dressing of the right lower extremity - wound team consult  acute superficial vein thrombosis - start warm compresses to this area   DVT prophylaxsis Lovenox  Code Status:      Code Status Orders        Start     Ordered   06/30/15 2217  Full code   Continuous     06/30/15 2217    Code Status History    Date Active Date Inactive Code Status Order ID Comments User Context   05/07/2015 12:10 AM 05/09/2015  7:57 PM Full Code [id number]  Alex Bulls, MD Inpatient      Family Communication: Discussed in detail with the patient, all imaging results, lab results explained to the patient   Disposition Plan:  Continue treatment for pneumonia, follow culture results, anticipate discharge in 3-4 days     Consultants:  None  Procedures:  None  Antibiotics: Anti-infectives    Start     Dose/Rate Route Frequency Ordered Stop   07/01/15 1900  levofloxacin (LEVAQUIN) IVPB 750 mg     750 mg 100 mL/hr over 90 Minutes Intravenous Every 24 hours 06/30/15 2225     07/01/15 0800  vancomycin (VANCOCIN) 1,250 mg in sodium chloride 0.9 % 250 mL IVPB     1,250 mg 166.7 mL/hr over 90  Minutes Intravenous Every 8 hours 06/30/15 2229     06/30/15 2300  vancomycin (VANCOCIN) 2,500 mg in sodium chloride 0.9 % 500 mL IVPB     2,500 mg 250 mL/hr over 120 Minutes Intravenous  Once 06/30/15 2228 07/01/15 0140   06/30/15 2230  ceFEPIme (MAXIPIME) 1 g in dextrose 5 % 50 mL IVPB     1 g 100 mL/hr over 30 Minutes Intravenous 3 times per day 06/30/15 2217 07/08/15 2159   06/30/15 1845  levofloxacin (LEVAQUIN) IVPB 750 mg      750 mg 100 mL/hr over 90 Minutes Intravenous  Once 06/30/15 1834 06/30/15 2043         HPI/Subjective: Sitting comfortably in bed and eating, denies any chest pain but does complain of worsening dyspnea on exertion for the last 3-4 weeks  Objective: Filed Vitals:   06/30/15 2126 07/01/15 0537 07/01/15 1331 07/01/15 1421  BP: 166/64 167/72  139/68  Pulse: 112 87  86  Temp: 98.6 F (37 C) 98 F (36.7 C)  98.3 F (36.8 C)  TempSrc: Oral Oral  Oral  Resp: 20 20  20   Height: 5\' 10"  (1.778 m)     Weight: 133.085 kg (293 lb 6.4 oz) 132.677 kg (292 lb 8 oz)    SpO2: 95% 95% 90% 93%    Intake/Output Summary (Last 24 hours) at 07/01/15 1520 Last data filed at 07/01/15 1421  Gross per 24 hour  Intake    460 ml  Output      0 ml  Net    460 ml    Exam:  General: No acute respiratory distress Lungs: Clear to auscultation bilaterally without wheezes or crackles Cardiovascular: Regular rate and rhythm without murmur gallop or rub normal S1 and S2 Abdomen: Nontender, nondistended, soft, bowel sounds positive, no rebound, no ascites, no appreciable mass Musculoskeletal: Mild edema the lower extremities.     Data Review   Micro Results No results found for this or any previous visit (from the past 240 hour(s)).  Radiology Reports Dg Chest 2 View  06/30/2015  CLINICAL DATA:  Cough, shortness of breath EXAM: CHEST  2 VIEW COMPARISON:  12/04/2011 FINDINGS: Multifocal patchy opacities in the bilateral upper lobes (right greater than left) and bilateral lower lobes, suspicious for pneumonia. Suspected small right pleural effusion. No pneumothorax. The heart is normal in size. Mild degenerative changes of the visualized thoracolumbar spine. IMPRESSION: Multifocal patchy opacities, suspicious for pneumonia. Suspected small right pleural effusion. Electronically Signed   By: Julian Hy M.D.   On: 06/30/2015 17:52   Ct Angio Chest Pe W/cm &/or Wo Cm  07/01/2015  CLINICAL DATA:   Increasing shortness of breath for 2 days. EXAM: CT ANGIOGRAPHY CHEST WITH CONTRAST TECHNIQUE: Multidetector CT imaging of the chest was performed using the standard protocol during bolus administration of intravenous contrast. Multiplanar CT image reconstructions and MIPs were obtained to evaluate the vascular anatomy. CONTRAST:  142mL OMNIPAQUE IOHEXOL 350 MG/ML SOLN COMPARISON:  Chest x-ray dated 06/30/2015 FINDINGS: There are no pulmonary emboli. Heart size is normal. There is slight mediastinal adenopathy. There is a 13 mm azygos lymph node with multiple small lymph nodes adjacent to the arch of the aorta. There is a 2.5 cm nodal mass in the subcarinal region. There are large bilateral pleural effusions with compressive atelectasis at both lung bases, left more than right. There are patchy bilateral upper lobe pulmonary infiltrates. I suspect the adenopathy is secondary to these infiltrates. There is hepatomegaly, nonspecific. The  patient has subcutaneous edema in the upper abdomen and lower chest. Review of the MIP images confirms the above findings. IMPRESSION: 1. No pulmonary emboli. 2. Patchy bilateral upper lobe infiltrates with probable reactive mediastinal adenopathy. 3. Large bilateral pleural effusions with compressive atelectasis of the lower lobes. 4. Nonspecific hepatomegaly and anasarca. Electronically Signed   By: Lorriane Shire M.D.   On: 07/01/2015 09:43     CBC  Recent Labs Lab 06/30/15 1810 07/01/15 0547  WBC 7.0 4.6  HGB 10.7* 10.0*  HCT 33.6* 32.2*  PLT 263 276  MCV 90.3 92.8  MCH 28.8 28.8  MCHC 31.8 31.1  RDW 13.6 14.0  LYMPHSABS 1.2 0.5*  MONOABS 0.9 0.1  EOSABS 0.1 0.0  BASOSABS 0.0 0.0    Chemistries   Recent Labs Lab 06/30/15 1810 07/01/15 0547  NA 134* 134*  K 4.0 4.9  CL 102 101  CO2 26 25  GLUCOSE 165* 367*  BUN 17 23*  CREATININE 1.01 1.12  CALCIUM 8.0* 7.8*  AST  --  38  ALT  --  18  ALKPHOS  --  183*  BILITOT  --  0.9    ------------------------------------------------------------------------------------------------------------------ estimated creatinine clearance is 114.2 mL/min (by C-G formula based on Cr of 1.12). ------------------------------------------------------------------------------------------------------------------ No results for input(s): HGBA1C in the last 72 hours. ------------------------------------------------------------------------------------------------------------------ No results for input(s): CHOL, HDL, LDLCALC, TRIG, CHOLHDL, LDLDIRECT in the last 72 hours. ------------------------------------------------------------------------------------------------------------------  Recent Labs  06/30/15 2245  TSH 1.919   ------------------------------------------------------------------------------------------------------------------ No results for input(s): VITAMINB12, FOLATE, FERRITIN, TIBC, IRON, RETICCTPCT in the last 72 hours.  Coagulation profile No results for input(s): INR, PROTIME in the last 168 hours.   Recent Labs  06/30/15 2245  DDIMER 1.99*    Cardiac Enzymes No results for input(s): CKMB, TROPONINI, MYOGLOBIN in the last 168 hours.  Invalid input(s): CK ------------------------------------------------------------------------------------------------------------------ Invalid input(s): POCBNP   CBG:  Recent Labs Lab 06/30/15 2231 07/01/15 0753 07/01/15 1203  GLUCAP 224* 305* 296*       Studies: Dg Chest 2 View  06/30/2015  CLINICAL DATA:  Cough, shortness of breath EXAM: CHEST  2 VIEW COMPARISON:  12/04/2011 FINDINGS: Multifocal patchy opacities in the bilateral upper lobes (right greater than left) and bilateral lower lobes, suspicious for pneumonia. Suspected small right pleural effusion. No pneumothorax. The heart is normal in size. Mild degenerative changes of the visualized thoracolumbar spine. IMPRESSION: Multifocal patchy opacities, suspicious  for pneumonia. Suspected small right pleural effusion. Electronically Signed   By: Julian Hy M.D.   On: 06/30/2015 17:52   Ct Angio Chest Pe W/cm &/or Wo Cm  07/01/2015  CLINICAL DATA:  Increasing shortness of breath for 2 days. EXAM: CT ANGIOGRAPHY CHEST WITH CONTRAST TECHNIQUE: Multidetector CT imaging of the chest was performed using the standard protocol during bolus administration of intravenous contrast. Multiplanar CT image reconstructions and MIPs were obtained to evaluate the vascular anatomy. CONTRAST:  188mL OMNIPAQUE IOHEXOL 350 MG/ML SOLN COMPARISON:  Chest x-ray dated 06/30/2015 FINDINGS: There are no pulmonary emboli. Heart size is normal. There is slight mediastinal adenopathy. There is a 13 mm azygos lymph node with multiple small lymph nodes adjacent to the arch of the aorta. There is a 2.5 cm nodal mass in the subcarinal region. There are large bilateral pleural effusions with compressive atelectasis at both lung bases, left more than right. There are patchy bilateral upper lobe pulmonary infiltrates. I suspect the adenopathy is secondary to these infiltrates. There is hepatomegaly, nonspecific. The patient has subcutaneous edema in the  upper abdomen and lower chest. Review of the MIP images confirms the above findings. IMPRESSION: 1. No pulmonary emboli. 2. Patchy bilateral upper lobe infiltrates with probable reactive mediastinal adenopathy. 3. Large bilateral pleural effusions with compressive atelectasis of the lower lobes. 4. Nonspecific hepatomegaly and anasarca. Electronically Signed   By: Lorriane Shire M.D.   On: 07/01/2015 09:43      Lab Results  Component Value Date   HGBA1C 12.3* 05/06/2015   HGBA1C 11.6* 07/18/2014   HGBA1C 11.3* 12/11/2013   Lab Results  Component Value Date   MICROALBUR 67.4* 07/18/2014   LDLCALC 123* 09/21/2013   CREATININE 1.12 07/01/2015       Scheduled Meds: . amLODipine  10 mg Oral Daily  . aspirin  325 mg Oral Daily  .  ceFEPime (MAXIPIME) IV  1 g Intravenous 3 times per day  . cloNIDine  0.1 mg Oral BID  . enoxaparin (LOVENOX) injection  60 mg Subcutaneous QHS  . FLUoxetine  40 mg Oral BID  . furosemide  40 mg Oral BID  . gabapentin  300 mg Oral TID  . guaiFENesin-dextromethorphan  5 mL Oral 4 times per day  . insulin aspart  0-9 Units Subcutaneous TID WC  . insulin NPH Human  29 Units Subcutaneous BID AC  . levofloxacin (LEVAQUIN) IV  750 mg Intravenous Q24H  . metoprolol succinate  50 mg Oral BID  . vancomycin  1,250 mg Intravenous Q8H   Continuous Infusions:   Principal Problem:   Acute respiratory failure with hypoxia ([facility]) Active Problems:   Essential hypertension   OSA (obstructive sleep apnea)   CAP (community acquired pneumonia)   Diabetes mellitus type 2, insulin dependent ([place])   Acute respiratory failure ([place])   HCAP (healthcare-associated pneumonia)    Time spent: 48 minutes   [facility] Pager [phone]. If 7PM-7AM, please contact night-coverage at [email or web address], password [facility] 07/01/2015, 3:20 PM  LOS: 1 day

## 2015-07-01 NOTE — Progress Notes (Signed)
Patient refused hospital CPAP.  Acknowledged that he can change his mind at any time.

## 2015-07-01 NOTE — Progress Notes (Signed)
The preliminary results of dopplers showed:There is evidence of acute superficial vein thrombosis involving a small segment of a varicose vein in the right medial proximal calf.  MD made aware, will continue to monitor closely.

## 2015-07-01 NOTE — Consult Note (Signed)
WOC wound consult note Reason for Consult: Chronic venous insufficiency with nonintact lesions to right lower leg, just below the knee.  Right dorsal leg, below knee and right posterior calf.  LEft leg is intact with chronic skin changes consistent with venous insufficiency.  Has worn compression wraps before and is noncompliant with removable compression garment at home.   Wound type:Venous  Pressure Ulcer POA: N/A Measurement:Right leg just below knee 1 cm x 1 cm x 0.1 cm lesion Right posterior lower leg 2 cm x 1.5 cm raised pink lesion Right dorsal lower leg 2 linear abrasions (consistent with compression wrinkles) 0.3 cm x 4 cm raised and pink Wound bed:100% pink and moist Drainage (amount, consistency, odor) Scant serous.  No odor Periwound:Bilateral lower legs with dry skin.  Both are cleansed with soap and water and moisturized.  Dressing procedure/placement/frequency:Cleanse bilateral lower legs with soap and water and pat gently dry. Moisturize both legs with barrier cream.  Apply Aquacel Ag to nonintact lesions on right leg.  Moisten with NS. Apply zinc layer secured with self adhering wrap.  [place].  Will not follow at this time.  Please re-consult if needed.  Domenic Moras RN BSN [place] Pager [phone]

## 2015-07-02 ENCOUNTER — Inpatient Hospital Stay (HOSPITAL_COMMUNITY): Payer: Medicaid Other

## 2015-07-02 DIAGNOSIS — R079 Chest pain, unspecified: Secondary | ICD-10-CM

## 2015-07-02 DIAGNOSIS — R601 Generalized edema: Secondary | ICD-10-CM

## 2015-07-02 LAB — LEGIONELLA ANTIGEN, URINE

## 2015-07-02 LAB — CBC
HCT: 33.7 % — ABNORMAL LOW (ref 39.0–52.0)
HEMOGLOBIN: 10.4 g/dL — AB (ref 13.0–17.0)
MCH: 29.1 pg (ref 26.0–34.0)
MCHC: 30.9 g/dL (ref 30.0–36.0)
MCV: 94.1 fL (ref 78.0–100.0)
PLATELETS: 324 10*3/uL (ref 150–400)
RBC: 3.58 MIL/uL — AB (ref 4.22–5.81)
RDW: 13.9 % (ref 11.5–15.5)
WBC: 9.1 10*3/uL (ref 4.0–10.5)

## 2015-07-02 LAB — GLUCOSE, CAPILLARY
GLUCOSE-CAPILLARY: 95 mg/dL (ref 65–99)
GLUCOSE-CAPILLARY: 103 mg/dL — AB (ref 65–99)
Glucose-Capillary: 191 mg/dL — ABNORMAL HIGH (ref 65–99)
Glucose-Capillary: 146 mg/dL — ABNORMAL HIGH (ref 65–99)

## 2015-07-02 LAB — COMPREHENSIVE METABOLIC PANEL
ALK PHOS: 157 U/L — AB (ref 38–126)
ALT: 22 U/L (ref 17–63)
AST: 49 U/L — AB (ref 15–41)
Albumin: 2.5 g/dL — ABNORMAL LOW (ref 3.5–5.0)
Anion gap: 7 (ref 5–15)
BUN: 30 mg/dL — AB (ref 6–20)
CALCIUM: 8.1 mg/dL — AB (ref 8.9–10.3)
CHLORIDE: 103 mmol/L (ref 101–111)
CO2: 27 mmol/L (ref 22–32)
CREATININE: 1.28 mg/dL — AB (ref 0.61–1.24)
GFR calc Af Amer: >60 mL/min (ref >60)
GFR calc non Af Amer: >60 mL/min (ref >60)
Glucose, Bld: 110 mg/dL — ABNORMAL HIGH (ref 65–99)
Potassium: 4.7 mmol/L (ref 3.5–5.1)
SODIUM: 137 mmol/L (ref 135–145)
Total Bilirubin: 0.8 mg/dL (ref 0.3–1.2)
Total Protein: 6.2 g/dL — ABNORMAL LOW (ref 6.5–8.1)

## 2015-07-02 LAB — PROCALCITONIN: Procalcitonin: 0.13 ng/mL

## 2015-07-02 MED ORDER — FUROSEMIDE 10 MG/ML IJ SOLN
40.0000 mg | Freq: Two times a day (BID) | INTRAMUSCULAR | Status: DC
  Administered 2015-07-02 – 2015-07-03 (×2): 40 mg via INTRAVENOUS
  Filled 2015-07-02 (×2): qty 4

## 2015-07-02 NOTE — Progress Notes (Signed)
*  PRELIMINARY RESULTS* Echocardiogram 2D Echocardiogram has been performed.  Leavy Cella 07/02/2015, 10:05 AM

## 2015-07-02 NOTE — Progress Notes (Signed)
Pt has refused CPAP for the night.  RT to monitor and assess as needed.  

## 2015-07-02 NOTE — Progress Notes (Signed)
PROGRESS NOTE    Alex Ryan  [id number]  DOB: 18-Apr-1971  DOA: 06/30/2015 PCP: Laurey Morale, MD Outpatient Specialists: Cardiology: Dr. Pershing Proud course: 45 year old male patient with history of HTN, DM 2 with peripheral neuropathy, OSA-non-compliant with CPAP, HLD, chronic kidney disease, anemia, persistent lower extremity edema with recurrent cellulitis presented to [facility] long ED on 06/30/15 with worsening dyspnea of 2-3 days duration, productive cough without fever or chills. Chest x-ray showed pleural effusion and pneumonia. Admitted for further management.  Assessment & Plan:   1. Possible healthcare associated pneumonia: Hospitalized in December 2016 for lower extremity cellulitis. CTA chest: Negative for PE but showed patchy bilateral upper lobe infiltrates with probable reactive mediastinal adenopathy. Treating empirically with vancomycin, cefepime and levofloxacin. Pro-calcitonin 0.10 > 0.13. Urine Legionella and streptococcal antigen negative. HIV antibody: Nonreactive. Flu panel PCR: Negative. Improving. Consider transitioning to oral antibiotics in a.m. 2. Large bilateral pleural effusions: By CT chest. Associated with compressive atelectasis.? Acute on chronic diastolic CHF. Continue diuresis and follow chest x-ray in a.m. 3. Acute respiratory failure with hypoxia: Secondary to pneumonia complicating underlying OSA, bilateral pleural effusion seen on CT. Treatment as above. Evaluate for home oxygen needs prior to discharge. 4. Anasarca/Lower extremity edema/chronic venous insufficiency:? Acute on chronic diastolic CHF. Edema extends up lower anterior abdominal wall. Has bilateral lower extremity wraps. Treating with IV Lasix-good diuresis but creatinine has slightly increased. Reduced Lasix to 40 mg twice a day. Strict intake output and daily weights. [place] input 2/13 appreciated. Follows outpatient with wound center. Lower extremity venous Dopplers-difficult  study but negative for DVT and positive for acute superficial vein thrombosis involving a small segment of a varicose vein in the right medial proximal calf. 5. DM 2 with peripheral neuropathy: Fluctuating and mildly uncontrolled. Continue NPH insulin and sliding scale. 6. OSA: Non-compliant with CPAP. States that his hose needs replacement. Case management consult. 7. Anemia: Stable 8. Essential hypertension: Controlled. 9. 6. NSVT on 2/13: Continue monitoring on telemetry. 2-D echo shows normal EF. Continue metoprolol. Potassium >4. Check magnesium in a.m.  DVT prophylaxis: Lovenox Code Status: Full Family Communication: Discussed with patient's mother, sister and stepson at bedside on 2/14. Disposition Plan: DC home possibly in the next 1-2 days.   Consultants:  None  Procedures:  2-D echo 07/02/15: Study Conclusions  - Left ventricle: The cavity size was normal. There was mild concentric hypertrophy. Systolic function was normal. The estimated ejection fraction was in the range of 55% to 60%. Images were inadequate for LV wall motion assessment. - Mitral valve: There was trivial regurgitation. - Recommendations: Limited study with definity contrast to assess wall motion.  Antimicrobials:  IV cefepime 2/12 >  IV levofloxacin 2/12 >  IV vancomycin 2/12 >  Subjective: Feels much better. Improved dyspnea. Cough with minimal green sputum. No chest pain. Urinating well.  Objective: Filed Vitals:   07/01/15 1421 07/01/15 2137 07/02/15 0621 07/02/15 1401  BP: 139/68 149/56 159/67 146/74  Pulse: 86 65 67 75  Temp: 98.3 F (36.8 C) 98 F (36.7 C) 98 F (36.7 C) 98.4 F (36.9 C)  TempSrc: Oral Oral Oral Oral  Resp: 20 20 20 18   Height:      Weight:      SpO2: 93% 95% 94% 93%    Intake/Output Summary (Last 24 hours) at 07/02/15 1639 Last data filed at 07/02/15 1030  Gross per 24 hour  Intake   1540 ml  Output    650 ml  Net    890 ml   Filed Weights    06/30/15 2126 07/01/15 0537  Weight: 133.085 kg (293 lb 6.4 oz) 132.677 kg (292 lb 8 oz)    Exam:  General exam: Moderately built and obese young male sitting comfortably propped up in bed. Respiratory system: Clear. No increased work of breathing. Cardiovascular system: S1 & S2 heard, RRR. No JVD, murmurs, gallops, clicks. 2+ pitting bilateral leg edema extending up to lower anterior abdominal wall. Telemetry: Sinus rhythm. 6. NSVT on 07/01/15 at 9:17 PM. Gastrointestinal system: Abdomen is nondistended, soft and nontender. Normal bowel sounds heard. Central nervous system: Alert and oriented. No focal neurological deficits. Extremities: Symmetric 5 x 5 power.   Data Reviewed: Basic Metabolic Panel:  Recent Labs Lab 06/30/15 1810 07/01/15 0547 07/02/15 0514  NA 134* 134* 137  K 4.0 4.9 4.7  CL 102 101 103  CO2 26 25 27   GLUCOSE 165* 367* 110*  BUN 17 23* 30*  CREATININE 1.01 1.12 1.28*  CALCIUM 8.0* 7.8* 8.1*   Liver Function Tests:  Recent Labs Lab 07/01/15 0547 07/02/15 0514  AST 38 49*  ALT 18 22  ALKPHOS 183* 157*  BILITOT 0.9 0.8  PROT 6.4* 6.2*  ALBUMIN 2.5* 2.5*   No results for input(s): LIPASE, AMYLASE in the last 168 hours. No results for input(s): AMMONIA in the last 168 hours. CBC:  Recent Labs Lab 06/30/15 1810 07/01/15 0547 07/02/15 0514  WBC 7.0 4.6 9.1  NEUTROABS 4.8 4.0  --   HGB 10.7* 10.0* 10.4*  HCT 33.6* 32.2* 33.7*  MCV 90.3 92.8 94.1  PLT 263 276 324   Cardiac Enzymes: No results for input(s): CKTOTAL, CKMB, CKMBINDEX, TROPONINI in the last 168 hours. BNP (last 3 results) No results for input(s): PROBNP in the last 8760 hours. CBG:  Recent Labs Lab 07/01/15 1203 07/01/15 1737 07/01/15 2134 07/02/15 0742 07/02/15 1202  GLUCAP 296* 281* 191* 95 103*    No results found for this or any previous visit (from the past 240 hour(s)).       Studies: Dg Chest 2 View  06/30/2015  CLINICAL DATA:  Cough, shortness of breath  EXAM: CHEST  2 VIEW COMPARISON:  12/04/2011 FINDINGS: Multifocal patchy opacities in the bilateral upper lobes (right greater than left) and bilateral lower lobes, suspicious for pneumonia. Suspected small right pleural effusion. No pneumothorax. The heart is normal in size. Mild degenerative changes of the visualized thoracolumbar spine. IMPRESSION: Multifocal patchy opacities, suspicious for pneumonia. Suspected small right pleural effusion. Electronically Signed   By: Julian Hy M.D.   On: 06/30/2015 17:52   Ct Angio Chest Pe W/cm &/or Wo Cm  07/01/2015  CLINICAL DATA:  Increasing shortness of breath for 2 days. EXAM: CT ANGIOGRAPHY CHEST WITH CONTRAST TECHNIQUE: Multidetector CT imaging of the chest was performed using the standard protocol during bolus administration of intravenous contrast. Multiplanar CT image reconstructions and MIPs were obtained to evaluate the vascular anatomy. CONTRAST:  117mL OMNIPAQUE IOHEXOL 350 MG/ML SOLN COMPARISON:  Chest x-ray dated 06/30/2015 FINDINGS: There are no pulmonary emboli. Heart size is normal. There is slight mediastinal adenopathy. There is a 13 mm azygos lymph node with multiple small lymph nodes adjacent to the arch of the aorta. There is a 2.5 cm nodal mass in the subcarinal region. There are large bilateral pleural effusions with compressive atelectasis at both lung bases, left more than right. There are patchy bilateral upper lobe pulmonary infiltrates. I suspect the adenopathy is secondary to  these infiltrates. There is hepatomegaly, nonspecific. The patient has subcutaneous edema in the upper abdomen and lower chest. Review of the MIP images confirms the above findings. IMPRESSION: 1. No pulmonary emboli. 2. Patchy bilateral upper lobe infiltrates with probable reactive mediastinal adenopathy. 3. Large bilateral pleural effusions with compressive atelectasis of the lower lobes. 4. Nonspecific hepatomegaly and anasarca. Electronically Signed   By:  Lorriane Shire M.D.   On: 07/01/2015 09:43        Scheduled Meds: . amLODipine  10 mg Oral Daily  . aspirin  325 mg Oral Daily  . ceFEPime (MAXIPIME) IV  1 g Intravenous 3 times per day  . cloNIDine  0.1 mg Oral BID  . enoxaparin (LOVENOX) injection  60 mg Subcutaneous QHS  . FLUoxetine  40 mg Oral BID  . furosemide  40 mg Intravenous BID  . gabapentin  300 mg Oral TID  . guaiFENesin-dextromethorphan  5 mL Oral 4 times per day  . insulin aspart  0-9 Units Subcutaneous TID WC  . insulin NPH Human  35 Units Subcutaneous BID AC & HS  . levofloxacin (LEVAQUIN) IV  750 mg Intravenous Q24H  . metoprolol succinate  50 mg Oral BID  . vancomycin  1,250 mg Intravenous Q8H   Continuous Infusions:   Principal Problem:   Acute respiratory failure with hypoxia (H[facility]) Active Problems:   Essential hypertension   OSA (obstructive sleep apnea)   CAP (community acquired pneumonia)   Diabetes mellitus type 2, insulin dependent ([place])   Acute respiratory failure ([place])   HCAP (healthcare-associated pneumonia)    Time spent: 45 minutes.    Vernell Leep, MD, FACP, FHM. [facility] Pager [phone]  If 7PM-7AM, please contact night-coverage [email or web address] Password [facility] 07/02/2015, 4:39 PM    LOS: 2 days

## 2015-07-03 ENCOUNTER — Inpatient Hospital Stay (HOSPITAL_COMMUNITY): Payer: Medicaid Other

## 2015-07-03 DIAGNOSIS — I10 Essential (primary) hypertension: Secondary | ICD-10-CM

## 2015-07-03 LAB — VANCOMYCIN, TROUGH: VANCOMYCIN TR: 34 ug/mL — AB (ref 10.0–20.0)

## 2015-07-03 LAB — GLUCOSE, CAPILLARY
GLUCOSE-CAPILLARY: 156 mg/dL — AB (ref 65–99)
GLUCOSE-CAPILLARY: 78 mg/dL (ref 65–99)
GLUCOSE-CAPILLARY: 180 mg/dL — AB (ref 65–99)
Glucose-Capillary: 151 mg/dL — ABNORMAL HIGH (ref 65–99)
Glucose-Capillary: 188 mg/dL — ABNORMAL HIGH (ref 65–99)

## 2015-07-03 LAB — BASIC METABOLIC PANEL
ANION GAP: 7 (ref 5–15)
BUN: 32 mg/dL — ABNORMAL HIGH (ref 6–20)
CALCIUM: 8.2 mg/dL — AB (ref 8.9–10.3)
CO2: 30 mmol/L (ref 22–32)
CREATININE: 1.13 mg/dL (ref 0.61–1.24)
Chloride: 103 mmol/L (ref 101–111)
Glucose, Bld: 101 mg/dL — ABNORMAL HIGH (ref 65–99)
Potassium: 4.3 mmol/L (ref 3.5–5.1)
Sodium: 140 mmol/L (ref 135–145)

## 2015-07-03 LAB — EXPECTORATED SPUTUM ASSESSMENT W REFEX TO RESP CULTURE

## 2015-07-03 LAB — EXPECTORATED SPUTUM ASSESSMENT W GRAM STAIN, RFLX TO RESP C

## 2015-07-03 MED ORDER — ALBUTEROL SULFATE (2.5 MG/3ML) 0.083% IN NEBU
2.5000 mg | INHALATION_SOLUTION | Freq: Three times a day (TID) | RESPIRATORY_TRACT | Status: DC
  Administered 2015-07-03 – 2015-07-09 (×16): 2.5 mg via RESPIRATORY_TRACT
  Filled 2015-07-03 (×15): qty 3

## 2015-07-03 MED ORDER — FUROSEMIDE 40 MG PO TABS
40.0000 mg | ORAL_TABLET | Freq: Two times a day (BID) | ORAL | Status: DC
  Administered 2015-07-03 – 2015-07-10 (×14): 40 mg via ORAL
  Filled 2015-07-03 (×14): qty 1

## 2015-07-03 NOTE — Progress Notes (Signed)
Pt has decline use of Hospital provided CPAP. RT to monitor and assess as needed.

## 2015-07-03 NOTE — Progress Notes (Signed)
Pharmacy Antibiotic Note  Alex Ryan is a 45 y.o. male admitted on 06/30/2015, currently on day 3 (full) of vancomycin, cefepime, and levofloxacin for pneumonia  Today, 07/03/2015: Temp: afebrile WBC: wnl Renal: SCr bump overnight 2/14, resolving today; CrCl 83N CXR with persistent bilat infiltrates c/w PNA  Plan:  Hold vancomycin d/t supratherapeutic trough  Will schedule random vancomycin level for tomorrow, but anticipate narrowing to PO abx per MD assessment yesterday  Continue cefepime and levaquin as ordered for now.  Would consider narrowing to oral monotherapy for remainder of course (Augmentin, Vantin, Levaquin)  Height: 5\' 10"  (177.8 cm) Weight: 292 lb 8 oz (132.677 kg) IBW/kg (Calculated) : 73  Temp (24hrs), Avg:98.3 F (36.8 C), Min:97.7 F (36.5 C), Max:98.8 F (37.1 C)   Recent Labs Lab 06/30/15 1810 07/01/15 0547 07/02/15 0514 07/03/15 0450 07/03/15 0748  WBC 7.0 4.6 9.1  --   --   CREATININE 1.01 1.12 1.28* 1.13  --   VANCOTROUGH  --   --   --   --  34*    Estimated Creatinine Clearance: 113.1 mL/min (by C-G formula based on Cr of 1.13).    Allergies  Allergen Reactions  . Codeine Hives    Hyper  . Erythromycin Nausea And Vomiting  . Other     Pre-op cleaning wipes:. itching  . Soap Itching    "dial soap"    Antimicrobials this admission: 2/12 >> vanc >> 2/12 >> levaquin >>  2/12 >> cefepime >>  Dose adjustments this admission: 2/15 0730 VT = 34 on 1250 mg q8 hr > holding  Microbiology results: 2/12 sputum: no result S. pneumo UAg: neg Legionella UAg: neg   Thank you for allowing pharmacy to be a part of this patient's care.   Reuel Boom, PharmD, BCPS Pager: [phone] 07/03/2015, 9:32 AM

## 2015-07-03 NOTE — Progress Notes (Signed)
Patient ID: Alex Ryan, male   DOB: 1971/05/06, 45 y.o.   MRN: [id number] [facility] PROGRESS NOTE  Alex Ryan:[id number] DOB: [birth year] DOA: 06/30/2015 PCP: [name], MD  Brief narrative:    45 year old male patient with history of HTN, DM 2 with peripheral neuropathy, OSA-non-compliant with CPAP, HLD, chronic kidney disease, anemia, persistent lower extremity edema with recurrent cellulitis who presented to [facility] long ED on 06/30/15 with worsening dyspnea of 2-3 days duration, productive cough without fever or chills. Chest x-ray showed pleural effusion and pneumonia.   His hospital course is complicated with persistent pneumonia seen on chest x-ray.   Assessment/Plan:    Principal problem: Acute respiratory failure with hypoxia / healthcare associated pneumonia, unspecified organism - Hypoxia likely related to persistent pneumonia - Chest x-ray this morning shows persistent bilateral pulmonary infiltrates consistent with pneumonia - CT angio chest on admission ruled out PE - He is on oxygen support with nasal cannula to keep oxygen saturation above 90% - We will continue cefepime but Levaquin and vancomycin can be stopped today - Influenza is negative, strep pneumonia and legionella are negative. HIV is nonreactive - Continue current nebulizer treatment   Active problems:  Large bilateral pleural effusions / LE swelling  - Was on IV Lasix 40 mg IV every 12 hours but we'll change to by mouth regimen per home dosing - Lower extremity venous Doppler - difficult study but negative for DVT and positive for acute superficial vein thrombosis involving a small segment of a varicose vein in the right medial proximal calf. 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  Family Communication:  plan of care discussed with the patient and his wife at the bedside  Disposition Plan: Home when stable, likely by 07/08/2015   IV access:  Peripheral IV  Procedures and diagnostic studies:    Dg Chest 2 View 07/03/2015  Persistent BILATERAL pulmonary infiltrates consistent with pneumonia. BILATERAL pleural effusions. Electronically Signed   By: [name] M.D.   On: 07/03/2015 08:41   Dg Chest 2 View 06/30/2015  Multifocal patchy opacities, suspicious for pneumonia. Suspected small right pleural effusion. Electronically Signed   By: [name] M.D.   On: 06/30/2015 17:52   Ct Angio Chest Pe W/cm &/or Wo Cm 07/01/2015  1. No pulmonary emboli. 2. Patchy bilateral upper lobe infiltrates with probable reactive mediastinal adenopathy. 3. Large bilateral pleural effusions with compressive atelectasis of the lower lobes. 4. Nonspecific hepatomegaly and anasarca. Electronically Signed   By: [name] M.D.   On: 07/01/2015 09:43   Medical Consultants:  None   Other Consultants:  Diabetic coordinator  IAnti-Infectives:   Cefepime 2/12 > Levofloxacin 2/12 > 2/15 Vancomycin 2/12 > 2/15   [name], MD  [facility] Pager  [phone]  Time spent in minutes: 25 minutes  If 7PM-7AM, please contact night-coverage [email or web address] Password [facility] 07/03/2015, 12:26 PM   LOS: 3 days    HPI/Subjective: No acute overnight events. Patient  reports he feels better since yesterday but still feels short of breath with exertion.  Objective: Filed Vitals:   07/03/15 4540 07/03/15 0927 07/03/15 0928 07/03/15 1027  BP: 161/73     Pulse: 67   70  Temp: 97.7 F (36.5 C)     TempSrc: Oral     Resp: 18   20  Height:      Weight:      SpO2: 95% 75% 91% 94%    Intake/Output Summary (Last 24 hours) at 07/03/15 1226 Last data filed at 07/03/15 0900  Gross per 24 hour  Intake   1080 ml  Output      0 ml  Net   1080 ml    Exam:   General:  Pt is alert, follows commands appropriately, not in acute distress  Cardiovascular: Regular rate and rhythm, S1/S2, no murmurs  Respiratory: Diminished, rhonchi in mid lung lobes  Abdomen: Soft, non tender, non distended, bowel sounds present  Extremities: LE pitting edema, pulses DP and PT palpable bilaterally  Neuro: Grossly nonfocal  Data Reviewed: Basic Metabolic Panel:  Recent Labs Lab 06/30/15 1810 07/01/15 0547 07/02/15 0514 07/03/15 0450  NA 134* 134* 137 140  K 4.0 4.9 4.7 4.3  CL 102 101 103 103  CO2 26 25 27 30   GLUCOSE 165* 367* 110* 101*  BUN 17 23* 30* 32*  CREATININE 1.01 1.12 1.28* 1.13  CALCIUM 8.0* 7.8* 8.1* 8.2*   Liver Function Tests:  Recent Labs Lab 07/01/15 0547 07/02/15 0514  AST 38 49*  ALT 18 22  ALKPHOS 183* 157*  BILITOT 0.9 0.8  PROT 6.4* 6.2*  ALBUMIN 2.5* 2.5*   No results for input(s): LIPASE, AMYLASE in the last 168 hours. No results for input(s): AMMONIA in the last 168 hours. CBC:  Recent Labs Lab 06/30/15 1810 07/01/15 0547 07/02/15 0514  WBC 7.0 4.6 9.1  NEUTROABS 4.8 4.0  --   HGB 10.7* 10.0* 10.4*  HCT 33.6* 32.2* 33.7*  MCV 90.3 92.8 94.1  PLT 263 276 324   Cardiac Enzymes: No results for input(s): CKTOTAL, CKMB, CKMBINDEX, TROPONINI in the last 168 hours. BNP: Invalid input(s): POCBNP CBG:  Recent Labs Lab 07/02/15 0742 07/02/15 1202 07/02/15 1700 07/02/15 2119 07/03/15 0754  GLUCAP 95 103* 146*  156* 78    No results found for this or any previous visit (from the past 240 hour(s)).   Scheduled Meds: . albuterol  2.5 mg Nebulization TID  . amLODipine  10 mg Oral Daily  . aspirin  325 mg Oral Daily  . ceFEPime (MAXIPIME) IV  1 g Intravenous 3 times per day  . cloNIDine  0.1 mg Oral BID  . enoxaparin (LOVENOX) injection  60 mg Subcutaneous QHS  . FLUoxetine  40 mg Oral BID  . furosemide  40 mg Oral BID  . gabapentin  300 mg Oral TID  . guaiFENesin-dextromethorphan  5 mL Oral 4 times per day  . insulin aspart  0-9 Units Subcutaneous TID WC  . insulin NPH Human  35 Units Subcutaneous BID AC & HS  . metoprolol succinate  50 mg Oral BID   Continuous Infusions:

## 2015-07-03 NOTE — Progress Notes (Signed)
CRITICAL VALUE ALERT  Critical value received:  Vancomycin trough 34  Date of notification:  07/03/2015   Time of notification:  [phone]  Critical value read back: yes  Nurse who received alert:  Kaylyn Layer   MD notified (1st page):  Magnus Ivan MD  Time of first page:  Called via telephone at [phone]  MD notified (2nd page): n/a  Time of second page:  Responding MD:   Magnus Ivan MD  Time MD responded:  [phone]  Telephone order to hold scheduled dose of Vancomycin that was due at 0800 and he would discontinue the vancomycin order.

## 2015-07-04 DIAGNOSIS — D631 Anemia in chronic kidney disease: Secondary | ICD-10-CM

## 2015-07-04 DIAGNOSIS — N182 Chronic kidney disease, stage 2 (mild): Secondary | ICD-10-CM

## 2015-07-04 LAB — GLUCOSE, CAPILLARY
GLUCOSE-CAPILLARY: 145 mg/dL — AB (ref 65–99)
Glucose-Capillary: 164 mg/dL — ABNORMAL HIGH (ref 65–99)
Glucose-Capillary: 183 mg/dL — ABNORMAL HIGH (ref 65–99)
Glucose-Capillary: 207 mg/dL — ABNORMAL HIGH (ref 65–99)

## 2015-07-04 MED ORDER — GUAIFENESIN ER 600 MG PO TB12
600.0000 mg | ORAL_TABLET | Freq: Two times a day (BID) | ORAL | Status: DC
  Administered 2015-07-04 – 2015-07-10 (×13): 600 mg via ORAL
  Filled 2015-07-04 (×13): qty 1

## 2015-07-04 NOTE — Progress Notes (Signed)
Patient refused CPAP.

## 2015-07-04 NOTE — Progress Notes (Signed)
Patient ID: Alex Ryan, male   DOB: 08/14/1970, 45 y.o.   MRN: [id number] [facility] PROGRESS NOTE  Alex Ryan [id number] DOB: 01-06-1971 DOA: 06/30/2015 PCP: Laurey Morale, MD  Brief narrative:    45 year old male patient with history of HTN, DM 2 with peripheral neuropathy, OSA-non-compliant with CPAP, HLD, chronic kidney disease, anemia, persistent lower extremity edema with recurrent cellulitis who presented to [facility] long ED on 06/30/15 with worsening dyspnea of 2-3 days duration, productive cough without fever or chills. Chest x-ray showed pleural effusion and pneumonia.   His hospital course is complicated with persistent pneumonia seen on chest x-ray.   Assessment/Plan:    Principal problem: Acute respiratory failure with hypoxia / healthcare associated pneumonia, unspecified organism - Hypoxia likely related to persistent pneumonia - Chest x-ray 2/15 showed persistent bilateral pulmonary infiltrates consistent with pneumonia - CT angio chest on admission showed no pulmonary embolism - He is currently on cefepime. Levaquin and vancomycin stopped 07/03/2015 - Influenza is negative, strep pneumonia and legionella are negative. HIV is nonreactive - Continue oxygen support via nasal cannula to keep oxygen saturation above 90%   Active problems:  Large bilateral pleural effusions / LE swelling  - Lasix changed to by mouth regimen per home dosing 40 mg twice daily - His weight is 132.6 kg on 07/01/2015 -Lower extremity venous Doppler - difficult study but negative for DVT and positive for acute superficial vein thrombosis involving a small segment of a varicose vein in the right medial proximal calf. 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 - Continue NPH insulin 35 units twice daily along with sliding scale insulin - Continue gabapentin for  neuropathy - CBG's in past 24 hours: 188, 180, 145  Obstructive sleep apnea - Not compliant with CPAP  Essential hypertension  - Continue Norvasc 10 mg daily, clonidine 0.1 mg twice daily  - Continue Lasix per home regimen, 40 mg PO BID - Continue metoprolol 50 mg twice daily   Chronic kidney disease, stage II - Baseline creatinine 1.3 about one month ago - Creatinine improved since admission   Anemia of chronic renal failure stage II - Hemoglobin stable at 10.4  Depression - Continue Prozac - Stable - Not depressed    DVT Prophylaxis  - Lovenox subQ in hospital   Code Status: Full.  Family Communication:  plan of care discussed with the patient and his wife at the bedside  Disposition Plan: Home when stable, likely by 07/08/2015   IV access:  Peripheral IV  Procedures and diagnostic studies:    Dg Chest 2 View 07/03/2015  Persistent BILATERAL pulmonary infiltrates consistent with pneumonia. BILATERAL pleural effusions. Electronically Signed   By: Lavonia Dana M.D.   On: 07/03/2015 08:41   Dg Chest 2 View 06/30/2015  Multifocal patchy opacities, suspicious for pneumonia. Suspected small right pleural effusion. Electronically Signed   By: Julian Hy M.D.   On: 06/30/2015 17:52   Ct Angio Chest Pe W/cm &/or Wo Cm 07/01/2015  1. No pulmonary emboli. 2. Patchy bilateral upper lobe infiltrates with probable reactive mediastinal adenopathy. 3. Large bilateral pleural effusions with compressive atelectasis of the lower lobes. 4. Nonspecific hepatomegaly and anasarca. Electronically Signed   By: Lorriane Shire M.D.   On: 07/01/2015 09:43   Medical Consultants:  None   Other Consultants:  Diabetic coordinator  IAnti-Infectives:   Cefepime 2/12 > Levofloxacin 2/12 >  2/15 Vancomycin 2/12 > 2/15   Leisa Lenz, MD  [facility] Pager [phone]  Time spent in minutes: 25 minutes  If 7PM-7AM, please contact night-coverage [email or web address] Password [facility] 07/04/2015,  11:52 AM   LOS: 4 days    HPI/Subjective: No acute overnight events. Patient reports he feels better.  Objective: Filed Vitals:   07/03/15 2049 07/03/15 2110 07/04/15 0634 07/04/15 1046  BP:  158/73 158/76   Pulse:  74 75   Temp:  98.3 F (36.8 C) 97.7 F (36.5 C)   TempSrc:  Oral Oral   Resp:  18 18   Height:      Weight:      SpO2: 93% 93% 93% 95%    Intake/Output Summary (Last 24 hours) at 07/04/15 1152 Last data filed at 07/04/15 0900  Gross per 24 hour  Intake    410 ml  Output      1 ml  Net    409 ml    Exam:   General:  Pt is not in acute distress  Cardiovascular: RRR, S1/S2 (+)  Respiratory: rhonchi and wheezing in upper lung lobes  Abdomen: (+) BS, non tender   Extremities: LE pitting edema, pulses palpable   Neuro: Nonfocal  Data Reviewed: Basic Metabolic Panel:  Recent Labs Lab 06/30/15 1810 07/01/15 0547 07/02/15 0514 07/03/15 0450  NA 134* 134* 137 140  K 4.0 4.9 4.7 4.3  CL 102 101 103 103  CO2 26 25 27 30   GLUCOSE 165* 367* 110* 101*  BUN 17 23* 30* 32*  CREATININE 1.01 1.12 1.28* 1.13  CALCIUM 8.0* 7.8* 8.1* 8.2*   Liver Function Tests:  Recent Labs Lab 07/01/15 0547 07/02/15 0514  AST 38 49*  ALT 18 22  ALKPHOS 183* 157*  BILITOT 0.9 0.8  PROT 6.4* 6.2*  ALBUMIN 2.5* 2.5*   No results for input(s): LIPASE, AMYLASE in the last 168 hours. No results for input(s): AMMONIA in the last 168 hours. CBC:  Recent Labs Lab 06/30/15 1810 07/01/15 0547 07/02/15 0514  WBC 7.0 4.6 9.1  NEUTROABS 4.8 4.0  --   HGB 10.7* 10.0* 10.4*  HCT 33.6* 32.2* 33.7*  MCV 90.3 92.8 94.1  PLT 263 276 324   Cardiac Enzymes: No results for input(s): CKTOTAL, CKMB, CKMBINDEX, TROPONINI in the last 168 hours. BNP: Invalid input(s): POCBNP CBG:  Recent Labs Lab 07/03/15 0754 07/03/15 1256 07/03/15 1746 07/03/15 2115 07/04/15 0734  GLUCAP 78 151* 188* 180* 145*    Recent Results (from the past 240 hour(s))  Culture,  sputum-assessment     Status: None   Collection Time: 07/03/15  1:57 PM  Result Value Ref Range Status   Specimen Description SPUTUM  Final   Special Requests NONE  Final   Sputum evaluation   Final    MICROSCOPIC FINDINGS SUGGEST THAT THIS SPECIMEN IS NOT REPRESENTATIVE OF LOWER RESPIRATORY SECRETIONS. PLEASE RECOLLECT. RESULTS CALLED TO, READ BACK BY AND VERIFIED WITH JESSICA DEUTSCH,RN [id number] @ [id number] BY J SCOTTON    Report Status 07/03/2015 FINAL  Final     Scheduled Meds: . albuterol  2.5 mg Nebulization TID  . amLODipine  10 mg Oral Daily  . aspirin  325 mg Oral Daily  . ceFEPime (MAXIPIME) IV  1 g Intravenous 3 times per day  . cloNIDine  0.1 mg Oral BID  . enoxaparin (LOVENOX) injection  60 mg Subcutaneous QHS  . FLUoxetine  40 mg Oral BID  . furosemide  40 mg Oral BID  .  gabapentin  300 mg Oral TID  . guaiFENesin-dextromethorphan  5 mL Oral 4 times per day  . insulin aspart  0-9 Units Subcutaneous TID WC  . insulin NPH Human  35 Units Subcutaneous BID AC & HS  . metoprolol succinate  50 mg Oral BID

## 2015-07-05 DIAGNOSIS — G4733 Obstructive sleep apnea (adult) (pediatric): Secondary | ICD-10-CM

## 2015-07-05 LAB — GLUCOSE, CAPILLARY
GLUCOSE-CAPILLARY: 164 mg/dL — AB (ref 65–99)
GLUCOSE-CAPILLARY: 99 mg/dL (ref 65–99)
GLUCOSE-CAPILLARY: 113 mg/dL — AB (ref 65–99)
Glucose-Capillary: 101 mg/dL — ABNORMAL HIGH (ref 65–99)

## 2015-07-05 NOTE — Progress Notes (Signed)
Patient ID: Alex Ryan, male   DOB: 1970/10/22, 46 y.o.   MRN: [id number] [facility] PROGRESS NOTE  Alex Ryan [id number] DOB: 27-Apr-1971 DOA: 06/30/2015 PCP: Laurey Morale, MD  Brief narrative:    45 year old male patient with history of HTN, DM 2 with peripheral neuropathy, OSA-non-compliant with CPAP, HLD, chronic kidney disease, anemia, persistent lower extremity edema with recurrent cellulitis who presented to [facility] long ED on 06/30/15 with worsening dyspnea of 2-3 days duration, productive cough without fever or chills. Chest x-ray showed pleural effusion and pneumonia.   His hospital course is complicated with persistent pneumonia seen on chest x-ray.   Assessment/Plan:    Principal problem: Acute respiratory failure with hypoxia / healthcare associated pneumonia, unspecified organism - Chest x-ray 2/15 showed persistent bilateral pulmonary infiltrates consistent with pneumonia - CT angio chest on admission showed no pulmonary embolism - Continue cefepime. Levaquin and vancomycin stopped 07/03/2015 - Influenza is negative, strep pneumonia and legionella are negative.  - HIV is nonreactive - Continue oxygen support via nasal cannula to keep oxygen saturation above 90%   Active problems:  Large bilateral pleural effusions / LE swelling  - Lasix changed to by mouth regimen 40 mg twice daily - Weight is 132.6 kg on 07/01/2015 -Lower extremity venous Doppler - difficult study but negative for DVT and positive for acute superficial vein thrombosis involving a small segment of a varicose vein in the right medial proximal calf. 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  Family Communication:  plan of care discussed with the patient and his wife at the bedside  Disposition Plan: Home when stable, likely by 07/07/2015   IV access:  Peripheral IV  Procedures and diagnostic studies:    Dg Chest 2 View 07/03/2015  Persistent BILATERAL pulmonary infiltrates consistent with pneumonia. BILATERAL pleural effusions. Electronically Signed   By: Lavonia Dana M.D.   On: 07/03/2015 08:41   Dg Chest 2 View 06/30/2015  Multifocal patchy opacities, suspicious for pneumonia. Suspected small right pleural effusion. Electronically Signed   By: Julian Hy M.D.   On: 06/30/2015 17:52   Ct Angio Chest Pe W/cm &/or Wo Cm 07/01/2015  1. No pulmonary emboli. 2. Patchy bilateral upper lobe infiltrates with probable reactive mediastinal adenopathy. 3. Large bilateral pleural effusions with compressive atelectasis of the lower lobes. 4. Nonspecific hepatomegaly and anasarca. Electronically Signed   By: Lorriane Shire M.D.   On: 07/01/2015 09:43   Medical Consultants:  None   Other Consultants:  Diabetic coordinator  IAnti-Infectives:   Cefepime 2/12 > Levofloxacin 2/12 > 2/15 Vancomycin 2/12 > 2/15   Leisa Lenz, MD  [facility] Pager [phone]  Time spent in minutes: 25 minutes  If 7PM-7AM, please contact night-coverage [email or web address] Password [facility] 07/05/2015, 12:39 PM  LOS: 5 days    HPI/Subjective: No acute overnight events. Says his O2 sat drops at night. He is not wearing CPAP in hospital due to difference in mask.  Objective: Filed Vitals:    07/04/15 2056 07/04/15 2207 07/05/15 0540 07/05/15 0823  BP: 169/83     Pulse: 70 68 73   Temp: 97.7 F (36.5 C)  97.7 F (36.5 C)   TempSrc: Oral  Oral   Resp: 20 20 20    Height:      Weight:   130.908 kg (288 lb 9.6 oz)   SpO2: 96% 94% 92% 94%    Intake/Output Summary (Last 24 hours) at 07/05/15 1239 Last data filed at 07/05/15 0540  Gross per 24 hour  Intake   1400 ml  Output      0 ml  Net   1400 ml    Exam:   General:  Pt is alert, awake  Cardiovascular: Rate controlled, S1/S2 appreciated   Respiratory: less wheezing, no rhonchi   Abdomen: (+) BS, non distended, non tender   Extremities: LE pitting edema, bilateral pulses   Neuro: No focal deficits   Data Reviewed: Basic Metabolic Panel:  Recent Labs Lab 06/30/15 1810 07/01/15 0547 07/02/15 0514 07/03/15 0450  NA 134* 134* 137 140  K 4.0 4.9 4.7 4.3  CL 102 101 103 103  CO2 26 25 27 30   GLUCOSE 165* 367* 110* 101*  BUN 17 23* 30* 32*  CREATININE 1.01 1.12 1.28* 1.13  CALCIUM 8.0* 7.8* 8.1* 8.2*   Liver Function Tests:  Recent Labs Lab 07/01/15 0547 07/02/15 0514  AST 38 49*  ALT 18 22  ALKPHOS 183* 157*  BILITOT 0.9 0.8  PROT 6.4* 6.2*  ALBUMIN 2.5* 2.5*   No results for input(s): LIPASE, AMYLASE in the last 168 hours. No results for input(s): AMMONIA in the last 168 hours. CBC:  Recent Labs Lab 06/30/15 1810 07/01/15 0547 07/02/15 0514  WBC 7.0 4.6 9.1  NEUTROABS 4.8 4.0  --   HGB 10.7* 10.0* 10.4*  HCT 33.6* 32.2* 33.7*  MCV 90.3 92.8 94.1  PLT 263 276 324   Cardiac Enzymes: No results for input(s): CKTOTAL, CKMB, CKMBINDEX, TROPONINI in the last 168 hours. BNP: Invalid input(s): POCBNP CBG:  Recent Labs Lab 07/04/15 1156 07/04/15 1639 07/04/15 2054 07/05/15 0753 07/05/15 1222  GLUCAP 164* 183* 207* 164* 99    Recent Results (from the past 240 hour(s))  Culture, sputum-assessment     Status: None   Collection Time: 07/03/15  1:57 PM  Result Value Ref Range  Status   Specimen Description SPUTUM  Final   Special Requests NONE  Final   Sputum evaluation   Final    MICROSCOPIC FINDINGS SUGGEST THAT THIS SPECIMEN IS NOT REPRESENTATIVE OF LOWER RESPIRATORY SECRETIONS. PLEASE RECOLLECT. RESULTS CALLED TO, READ BACK BY AND VERIFIED WITH JESSICA DEUTSCH,RN [id number] @ [id number] BY J SCOTTON    Report Status 07/03/2015 FINAL  Final     Scheduled Meds: . albuterol  2.5 mg Nebulization TID  . amLODipine  10 mg Oral Daily  . aspirin  325 mg Oral Daily  . ceFEPime (MAXIPIME) IV  1 g Intravenous 3 times per day  . cloNIDine  0.1 mg Oral BID  . enoxaparin (LOVENOX) injection  60 mg Subcutaneous QHS  . FLUoxetine  40 mg Oral BID  . furosemide  40 mg Oral BID  . gabapentin  300 mg Oral TID  . guaiFENesin  600 mg Oral BID  .  guaiFENesin-dextromethorphan  5 mL Oral 4 times per day  . insulin aspart  0-9 Units Subcutaneous TID WC  . insulin NPH Human  35 Units Subcutaneous BID AC & HS  . metoprolol succinate  50 mg Oral BID

## 2015-07-05 NOTE — Progress Notes (Signed)
Patient continues to decline nocturnal CPAP.  

## 2015-07-05 NOTE — Progress Notes (Signed)
Dressing on patient arm changed due to weeping

## 2015-07-06 ENCOUNTER — Inpatient Hospital Stay (HOSPITAL_COMMUNITY): Payer: Medicaid Other

## 2015-07-06 DIAGNOSIS — J90 Pleural effusion, not elsewhere classified: Secondary | ICD-10-CM | POA: Diagnosis present

## 2015-07-06 LAB — BASIC METABOLIC PANEL
Anion gap: 6 (ref 5–15)
BUN: 21 mg/dL — AB (ref 6–20)
CHLORIDE: 102 mmol/L (ref 101–111)
CO2: 31 mmol/L (ref 22–32)
Calcium: 8.2 mg/dL — ABNORMAL LOW (ref 8.9–10.3)
Creatinine, Ser: 0.86 mg/dL (ref 0.61–1.24)
GFR calc Af Amer: >60 mL/min (ref >60)
GFR calc non Af Amer: >60 mL/min (ref >60)
GLUCOSE: 119 mg/dL — AB (ref 65–99)
POTASSIUM: 4.3 mmol/L (ref 3.5–5.1)
Sodium: 139 mmol/L (ref 135–145)

## 2015-07-06 LAB — GLUCOSE, CAPILLARY
GLUCOSE-CAPILLARY: 134 mg/dL — AB (ref 65–99)
GLUCOSE-CAPILLARY: 183 mg/dL — AB (ref 65–99)
Glucose-Capillary: 63 mg/dL — ABNORMAL LOW (ref 65–99)
Glucose-Capillary: 77 mg/dL (ref 65–99)
Glucose-Capillary: 124 mg/dL — ABNORMAL HIGH (ref 65–99)
Glucose-Capillary: 210 mg/dL — ABNORMAL HIGH (ref 65–99)

## 2015-07-06 LAB — CBC
HCT: 35.8 % — ABNORMAL LOW (ref 39.0–52.0)
Hemoglobin: 10.9 g/dL — ABNORMAL LOW (ref 13.0–17.0)
MCH: 28.5 pg (ref 26.0–34.0)
MCHC: 30.4 g/dL (ref 30.0–36.0)
MCV: 93.5 fL (ref 78.0–100.0)
PLATELETS: 359 10*3/uL (ref 150–400)
RBC: 3.83 MIL/uL — AB (ref 4.22–5.81)
RDW: 13.5 % (ref 11.5–15.5)
WBC: 6.7 10*3/uL (ref 4.0–10.5)

## 2015-07-06 MED ORDER — LEVOFLOXACIN 750 MG PO TABS
750.0000 mg | ORAL_TABLET | Freq: Every day | ORAL | Status: DC
  Administered 2015-07-06 – 2015-07-10 (×5): 750 mg via ORAL
  Filled 2015-07-06 (×5): qty 1

## 2015-07-06 NOTE — Progress Notes (Signed)
Patient ID: Alex Ryan, male   DOB: [birth year], 45 y.o.   MRN: [id number] [facility] PROGRESS NOTE  Alex Ryan [id number] DOB: Dec 06, 1970 DOA: 06/30/2015 PCP: Laurey Morale, MD  Brief narrative:    45 year old male patient with history of HTN, DM 2 with peripheral neuropathy, OSA-non-compliant with CPAP, HLD, chronic kidney disease, anemia, persistent lower extremity edema with recurrent cellulitis who presented to [facility] long ED on 06/30/15 with worsening dyspnea of 2-3 days duration, productive cough without fever or chills. Chest x-ray showed pleural effusion and pneumonia.   His hospital course is complicated with persistent pneumonia seen on chest x-ray. Plan to repeat CXR this am.  Assessment/Plan:    Principal problem: Acute respiratory failure with hypoxia / healthcare associated pneumonia, unspecified organism - Chest x-ray 2/15 showed persistent bilateral pulmonary infiltrates consistent with pneumonia - CT angio chest on admission showed no pulmonary embolism - Lost IV access so cefepime not given today. Start levaquin by mouth - Pulmonary consulted, appreciate their input - Vancomycin was stopped 07/03/2015 - Influenza is negative, strep pneumonia and legionella are negative.  - HIV is nonreactive - Continue oxygen support via nasal cannula to keep oxygen saturation above 90%  Active problems:  Large bilateral pleural effusions / LE swelling  - Lasix changed to 40 mg twice daily which he is normally taking at home  - Weight 132.6 kg on 07/01/2015 - weight today 127 kg  - this below is the trend since 2/16 [facility] Weights   07/04/15 0634 07/05/15 0540 07/06/15 0333  Weight: 131.18 kg (289 lb 3.2 oz) 130.908 kg (288 lb 9.6 oz) 127 kg (279 lb 15.8 oz)  -Lower extremity venous Doppler - difficult study but negative for DVT and positive for acute superficial vein thrombosis involving a small segment of a varicose vein in the right medial proximal calf. 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 - Continue NPH insulin 35 units twice daily along with sliding scale insulin - Continue gabapentin for neuropathy - CBG's: 77, 124, 134  Obstructive sleep apnea - Not compliant with CPAP  Essential hypertension  - Continue Norvasc 10 mg daily, clonidine 0.1 mg twice daily  - Continue Lasix 40 mg PO BID - Continue metoprolol 50 mg twice daily   Chronic kidney disease, stage II - Baseline creatinine 1.3 about one month ago - Creatinine improved to normal   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  Family Communication:  plan of care discussed with the patient and his wife at the bedside  Disposition Plan: Home 2/20  IV access:  Peripheral IV  Procedures and diagnostic studies:    Dg Chest 2 View 07/03/2015  Persistent BILATERAL pulmonary infiltrates consistent with pneumonia. BILATERAL pleural effusions. Electronically Signed   By: Lavonia Dana M.D.   On: 07/03/2015 08:41   Dg Chest 2 View 06/30/2015  Multifocal patchy opacities, suspicious for pneumonia. Suspected small right pleural effusion. Electronically Signed   By: Julian Hy M.D.   On: 06/30/2015 17:52   Ct Angio Chest Pe W/cm &/or Wo Cm 07/01/2015  1. No pulmonary emboli. 2. Patchy bilateral upper lobe infiltrates with probable reactive mediastinal adenopathy. 3.  Large bilateral pleural effusions with compressive atelectasis of the lower lobes. 4. Nonspecific hepatomegaly and anasarca. Electronically Signed   By: Lorriane Shire M.D.   On: 07/01/2015 09:43   Medical Consultants:  None   Other Consultants:  Diabetic coordinator  IAnti-Infectives:   Cefepime 2/12  >2/18 Levofloxacin 2/12 > 2/15; 2/18 --> Vancomycin 2/12 > 2/15   Leisa Lenz, MD  [facility] Pager [phone]  Time spent in minutes: 25 minutes  If 7PM-7AM, please contact night-coverage [email or web address] Password [facility] 07/06/2015, 12:26 PM   LOS: 6 days    HPI/Subjective: No acute overnight events. Says he feels better.  Objective: Filed Vitals:   07/05/15 1449 07/05/15 2157 07/06/15 0333 07/06/15 1028  BP: 162/82 174/83 139/67   Pulse: 67 70 66 68  Temp: 97.8 F (36.6 C) 97.6 F (36.4 C) 97.7 F (36.5 C)   TempSrc: Oral Oral Oral   Resp: 20 20 20 20   Height:      Weight:   127 kg (279 lb 15.8 oz)   SpO2: 96% 97% 95% 95%    Intake/Output Summary (Last 24 hours) at 07/06/15 1226 Last data filed at 07/06/15 0533  Gross per 24 hour  Intake    990 ml  Output      0 ml  Net    990 ml    Exam:   General:  Pt is not in distress  Cardiovascular: RRR, appreciate S1, S2   Respiratory: diminished with rhonchi  Abdomen: non tender, non distended   Extremities: LE pitting edema, palpable pulses   Neuro: Nonfocal   Data Reviewed: Basic Metabolic Panel:  Recent Labs Lab 06/30/15 1810 07/01/15 0547 07/02/15 0514 07/03/15 0450 07/06/15 0536  NA 134* 134* 137 140 139  K 4.0 4.9 4.7 4.3 4.3  CL 102 101 103 103 102  CO2 26 25 27 30 31   GLUCOSE 165* 367* 110* 101* 119*  BUN 17 23* 30* 32* 21*  CREATININE 1.01 1.12 1.28* 1.13 0.86  CALCIUM 8.0* 7.8* 8.1* 8.2* 8.2*   Liver Function Tests:  Recent Labs Lab 07/01/15 0547 07/02/15 0514  AST 38 49*  ALT 18 22  ALKPHOS 183* 157*  BILITOT 0.9 0.8  PROT 6.4* 6.2*  ALBUMIN 2.5* 2.5*   No results for input(s): LIPASE, AMYLASE in the last 168 hours. No results for input(s): AMMONIA in the last 168 hours. CBC:  Recent Labs Lab 06/30/15 1810 07/01/15 0547 07/02/15 0514 07/06/15 0536  WBC 7.0 4.6 9.1 6.7  NEUTROABS 4.8 4.0  --   --   HGB 10.7* 10.0* 10.4* 10.9*  HCT 33.6* 32.2* 33.7* 35.8*   MCV 90.3 92.8 94.1 93.5  PLT 263 276 324 359   Cardiac Enzymes: No results for input(s): CKTOTAL, CKMB, CKMBINDEX, TROPONINI in the last 168 hours. BNP: Invalid input(s): POCBNP CBG:  Recent Labs Lab 07/05/15 2201 07/06/15 0328 07/06/15 0359 07/06/15 0746 07/06/15 1200  GLUCAP 113* 63* 77 124* 134*    Recent Results (from the past 240 hour(s))  Culture, sputum-assessment     Status: None   Collection Time: 07/03/15  1:57 PM  Result Value Ref Range Status   Specimen Description SPUTUM  Final   Special Requests NONE  Final   Sputum evaluation   Final    MICROSCOPIC FINDINGS SUGGEST THAT THIS SPECIMEN IS NOT REPRESENTATIVE OF LOWER RESPIRATORY SECRETIONS. PLEASE RECOLLECT. RESULTS CALLED TO, READ BACK BY AND VERIFIED WITH JESSICA DEUTSCH,RN [id number] @ [place]    Report Status 07/03/2015  FINAL  Final     Scheduled Meds: . albuterol  2.5 mg Nebulization TID  . amLODipine  10 mg Oral Daily  . aspirin  325 mg Oral Daily  . cloNIDine  0.1 mg Oral BID  . enoxaparin (LOVENOX) injection  60 mg Subcutaneous QHS  . FLUoxetine  40 mg Oral BID  . furosemide  40 mg Oral BID  . gabapentin  300 mg Oral TID  . guaiFENesin  600 mg Oral BID  . guaiFENesin-dextromethorphan  5 mL Oral 4 times per day  . insulin aspart  0-9 Units Subcutaneous TID WC  . insulin NPH Human  35 Units Subcutaneous BID AC & HS  . levofloxacin  750 mg Oral Daily  . metoprolol succinate  50 mg Oral BID

## 2015-07-06 NOTE — Progress Notes (Signed)
Pharmacy Antibiotic Note  Alex Ryan is a 45 y.o. male admitted on 06/30/2015 with pneumonia.  Pharmacy has been consulted for Levaquin oral dosing.  Plan:  Levaquin 750mg  PO q24h.  Today is Day #7 of broad spectrum antibiotics.  Recommend adding stop date to complete 7 days total.  Dosage remains stable and need for further dosage adjustment appears unlikely at present.  Pharmacy will sign off at this time.  Please reconsult if a change in clinical status warrants re-evaluation of dosage.   Height: 5\' 10"  (177.8 cm) Weight: 279 lb 15.8 oz (127 kg) IBW/kg (Calculated) : 73  Temp (24hrs), Avg:97.7 F (36.5 C), Min:97.6 F (36.4 C), Max:97.8 F (36.6 C)   Recent Labs Lab 06/30/15 1810 07/01/15 0547 07/02/15 0514 07/03/15 0450 07/03/15 0748 07/06/15 0536  WBC 7.0 4.6 9.1  --   --  6.7  CREATININE 1.01 1.12 1.28* 1.13  --  0.86  VANCOTROUGH  --   --   --   --  34*  --     Estimated Creatinine Clearance: 145.1 mL/min (by C-G formula based on Cr of 0.86).     Antimicrobials this admission: 2/12 >> vanc >> 2/15 2/12 >> levaquin >> 2/15 2/12 >> cefepime >> 2/18 2/18 >> levaquin PO >>  Dose adjustments this admission:  Microbiology results: 2/12 sputum: no result S. pneumo UAg: neg Legionella UAg: neg  Thank you for allowing pharmacy to be a part of this patient's care.  Gretta Arab PharmD, BCPS Pager [phone] 07/06/2015 12:19 PM

## 2015-07-06 NOTE — Progress Notes (Signed)
Pt refuses cpap for tonight, encouraged to call if he changes his mind.

## 2015-07-07 LAB — BASIC METABOLIC PANEL
Anion gap: 6 (ref 5–15)
BUN: 19 mg/dL (ref 6–20)
CHLORIDE: 101 mmol/L (ref 101–111)
CO2: 36 mmol/L — ABNORMAL HIGH (ref 22–32)
CREATININE: 0.77 mg/dL (ref 0.61–1.24)
Calcium: 8.2 mg/dL — ABNORMAL LOW (ref 8.9–10.3)
GFR calc Af Amer: >60 mL/min (ref >60)
Glucose, Bld: 111 mg/dL — ABNORMAL HIGH (ref 65–99)
Potassium: 4.2 mmol/L (ref 3.5–5.1)
SODIUM: 143 mmol/L (ref 135–145)

## 2015-07-07 LAB — GLUCOSE, CAPILLARY
GLUCOSE-CAPILLARY: 93 mg/dL (ref 65–99)
Glucose-Capillary: 129 mg/dL — ABNORMAL HIGH (ref 65–99)
Glucose-Capillary: 141 mg/dL — ABNORMAL HIGH (ref 65–99)
Glucose-Capillary: 142 mg/dL — ABNORMAL HIGH (ref 65–99)

## 2015-07-07 LAB — CBC
HCT: 34.6 % — ABNORMAL LOW (ref 39.0–52.0)
Hemoglobin: 10.7 g/dL — ABNORMAL LOW (ref 13.0–17.0)
MCH: 28.3 pg (ref 26.0–34.0)
MCHC: 30.9 g/dL (ref 30.0–36.0)
MCV: 91.5 fL (ref 78.0–100.0)
PLATELETS: 335 10*3/uL (ref 150–400)
RBC: 3.78 MIL/uL — ABNORMAL LOW (ref 4.22–5.81)
RDW: 13.1 % (ref 11.5–15.5)
WBC: 7 10*3/uL (ref 4.0–10.5)

## 2015-07-07 NOTE — Progress Notes (Signed)
Patient ID: Alex Ryan, male   DOB: 04/20/1971, 45 y.o.   MRN: [id number] [facility] PROGRESS NOTE  Alex Ryan [id number] DOB: 12/31/1970 DOA: 06/30/2015 PCP: Laurey Morale, MD  Brief narrative:    45 year old male patient with history of HTN, DM 2 with peripheral neuropathy, OSA-non-compliant with CPAP, HLD, chronic kidney disease, anemia, persistent lower extremity edema with recurrent cellulitis who presented to [facility] long ED on 06/30/15 with worsening dyspnea of 2-3 days duration, productive cough without fever or chills. Chest x-ray showed pleural effusion and pneumonia.   His hospital course is complicated with persistent pneumonia seen on chest x-ray. Plan to repeat CXR this am.  Assessment/Plan:    Principal problem: Acute respiratory failure with hypoxia / healthcare associated pneumonia, unspecified organism - Chest x-ray 2/15 showed persistent bilateral pulmonary infiltrates consistent with pneumonia - Repeat CXR 2/18 with increased right basilar pneumonia but improved right perihilar and left basilar pneumonia  - CT angio chest on admission showed no pulmonary embolism - Changed cefepime to Levaquin 2/18 - Vancomycin was stopped 07/03/2015 - Influenza is negative, strep pneumonia and legionella are negative.  - HIV is nonreactive - Continue oxygen support via nasal cannula to keep oxygen saturation above 90%  Active problems:  Large bilateral pleural effusions / LE swelling  - Lasix changed to 40 mg twice daily which he is normally taking at home  - Weight 132.6 kg on 07/01/2015; this am wt is 128 kg  -Lower extremity venous Doppler - difficult study but negative for DVT and positive for acute superficial vein thrombosis involving a small segment of a varicose vein in the right medial proximal calf. 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 with long term insulin  use - Continue NPH insulin 35 units twice daily along with sliding scale insulin - Continue gabapentin for neuropathy  Obstructive sleep apnea - Not compliant with CPAP  Essential hypertension  - Continue Norvasc 10 mg daily, clonidine 0.1 mg twice daily  - Continue Lasix 40 mg PO BID - Continue metoprolol 50 mg twice daily   Chronic kidney disease, stage II - Baseline creatinine 1.3 about one month ago - Creatinine normalized   Anemia of chronic renal failure stage II - Hemoglobin stable at 10.9  Depression - Continue Prozac   DVT Prophylaxis  - Lovenox subQ   Code Status: Full.  Family Communication:  plan of care discussed with the patient and his wife at the bedside  Disposition Plan: Home 2/20  IV access:  Peripheral IV  Procedures and diagnostic studies:    Dg Chest 2 View 07/03/2015  Persistent BILATERAL pulmonary infiltrates consistent with pneumonia. BILATERAL pleural effusions. Electronically Signed   By: Lavonia Dana M.D.   On: 07/03/2015 08:41   Dg Chest 2 View 06/30/2015  Multifocal patchy opacities, suspicious for pneumonia. Suspected small right pleural effusion. Electronically Signed   By: Julian Hy M.D.   On: 06/30/2015 17:52   Ct Angio Chest Pe W/cm &/or Wo Cm 07/01/2015  1. No pulmonary emboli. 2. Patchy bilateral upper lobe infiltrates with probable reactive mediastinal adenopathy. 3. Large bilateral pleural effusions with compressive atelectasis of the lower lobes. 4. Nonspecific hepatomegaly and anasarca. Electronically Signed   By: Lorriane Shire M.D.   On: 07/01/2015 09:43   Medical Consultants:  None   Other Consultants:  Diabetic coordinator  IAnti-Infectives:   Cefepime 2/12 >2/18 Levofloxacin 2/12 > 2/15; 2/18 -->  Vancomycin 2/12 > 2/15   Leisa Lenz, MD  [facility] Pager [phone]  Time spent in minutes: 25 minutes  If 7PM-7AM, please contact night-coverage [email or web address] Password [email or web address] 07/07/2015, 2:07 PM   LOS: 7  days    HPI/Subjective: No acute overnight events. Feels better.   Objective: Filed Vitals:   07/06/15 1400 07/06/15 2134 07/07/15 0614 07/07/15 1044  BP: 148/75  149/76 163/80  Pulse: 67  62 62  Temp: 98.1 F (36.7 C)  97.8 F (36.6 C)   TempSrc: Oral  Oral   Resp: 20  20   Height:      Weight:   128 kg (282 lb 3 oz)   SpO2: 98% 96% 97%     Intake/Output Summary (Last 24 hours) at 07/07/15 1407 Last data filed at 07/07/15 0755  Gross per 24 hour  Intake    480 ml  Output      0 ml  Net    480 ml    Exam:   General:  Pt is alert, awake   Cardiovascular: Rate controlled, appreciate S1 S2    Respiratory: wheezing, no rhonchi   Abdomen: (+) BS, non tender   Extremities: LE pitting edema appreciate, palpable pulses   Neuro: No focal deficits   Data Reviewed: Basic Metabolic Panel:  Recent Labs Lab 07/01/15 0547 07/02/15 0514 07/03/15 0450 07/06/15 0536 07/07/15 0550  NA 134* 137 140 139 143  K 4.9 4.7 4.3 4.3 4.2  CL 101 103 103 102 101  CO2 25 27 30 31  36*  GLUCOSE 367* 110* 101* 119* 111*  BUN 23* 30* 32* 21* 19  CREATININE 1.12 1.28* 1.13 0.86 0.77  CALCIUM 7.8* 8.1* 8.2* 8.2* 8.2*   Liver Function Tests:  Recent Labs Lab 07/01/15 0547 07/02/15 0514  AST 38 49*  ALT 18 22  ALKPHOS 183* 157*  BILITOT 0.9 0.8  PROT 6.4* 6.2*  ALBUMIN 2.5* 2.5*   No results for input(s): LIPASE, AMYLASE in the last 168 hours. No results for input(s): AMMONIA in the last 168 hours. CBC:  Recent Labs Lab 06/30/15 1810 07/01/15 0547 07/02/15 0514 07/06/15 0536 07/07/15 0550  WBC 7.0 4.6 9.1 6.7 7.0  NEUTROABS 4.8 4.0  --   --   --   HGB 10.7* 10.0* 10.4* 10.9* 10.7*  HCT 33.6* 32.2* 33.7* 35.8* 34.6*  MCV 90.3 92.8 94.1 93.5 91.5  PLT 263 276 324 359 335   Cardiac Enzymes: No results for input(s): CKTOTAL, CKMB, CKMBINDEX, TROPONINI in the last 168 hours. BNP: Invalid input(s): POCBNP CBG:  Recent Labs Lab 07/06/15 1200 07/06/15 1624  07/06/15 2136 07/07/15 0738 07/07/15 1146  GLUCAP 134* 183* 210* 93 129*    Recent Results (from the past 240 hour(s))  Culture, sputum-assessment     Status: None   Collection Time: 07/03/15  1:57 PM  Result Value Ref Range Status   Specimen Description SPUTUM  Final   Special Requests NONE  Final   Sputum evaluation   Final    MICROSCOPIC FINDINGS SUGGEST THAT THIS SPECIMEN IS NOT REPRESENTATIVE OF LOWER RESPIRATORY SECRETIONS. PLEASE RECOLLECT. RESULTS CALLED TO, READ BACK BY AND VERIFIED WITH JESSICA DEUTSCH,RN [id number] @ [id number] BY J SCOTTON    Report Status 07/03/2015 FINAL  Final     Scheduled Meds: . albuterol  2.5 mg Nebulization TID  . amLODipine  10 mg Oral Daily  . aspirin  325 mg Oral Daily  . cloNIDine  0.1 mg Oral BID  . enoxaparin (LOVENOX)  injection  60 mg Subcutaneous QHS  . FLUoxetine  40 mg Oral BID  . furosemide  40 mg Oral BID  . gabapentin  300 mg Oral TID  . guaiFENesin  600 mg Oral BID  . guaiFENesin-dextromethorphan  5 mL Oral 4 times per day  . insulin aspart  0-9 Units Subcutaneous TID WC  . insulin NPH Human  35 Units Subcutaneous BID AC & HS  . levofloxacin  750 mg Oral Daily  . metoprolol succinate  50 mg Oral BID

## 2015-07-07 NOTE — Progress Notes (Signed)
Dressing changed to right upper arm due to weeping. Fluid filled blister present.

## 2015-07-07 NOTE — Progress Notes (Signed)
Pt refused cpap tonight.  Pt was advised that RT is available all night should he change his mind. 

## 2015-07-08 ENCOUNTER — Inpatient Hospital Stay (HOSPITAL_COMMUNITY): Payer: Medicaid Other

## 2015-07-08 DIAGNOSIS — J189 Pneumonia, unspecified organism: Principal | ICD-10-CM

## 2015-07-08 DIAGNOSIS — J9601 Acute respiratory failure with hypoxia: Secondary | ICD-10-CM

## 2015-07-08 DIAGNOSIS — J90 Pleural effusion, not elsewhere classified: Secondary | ICD-10-CM

## 2015-07-08 LAB — PROTEIN, TOTAL: TOTAL PROTEIN: 6.2 g/dL — AB (ref 6.5–8.1)

## 2015-07-08 LAB — GLUCOSE, CAPILLARY
GLUCOSE-CAPILLARY: 128 mg/dL — AB (ref 65–99)
GLUCOSE-CAPILLARY: 197 mg/dL — AB (ref 65–99)
Glucose-Capillary: 62 mg/dL — ABNORMAL LOW (ref 65–99)
Glucose-Capillary: 82 mg/dL (ref 65–99)
Glucose-Capillary: 124 mg/dL — ABNORMAL HIGH (ref 65–99)

## 2015-07-08 LAB — BODY FLUID CELL COUNT WITH DIFFERENTIAL
Eos, Fluid: 1 %
Lymphs, Fluid: 65 %
Monocyte-Macrophage-Serous Fluid: 23 % — ABNORMAL LOW (ref 50–90)
Neutrophil Count, Fluid: 11 % (ref 0–25)
WBC FLUID: 916 uL (ref 0–1000)

## 2015-07-08 LAB — LACTATE DEHYDROGENASE, PLEURAL OR PERITONEAL FLUID: LD, Fluid: 90 U/L — ABNORMAL HIGH (ref 3–23)

## 2015-07-08 LAB — PROTEIN, BODY FLUID: Total protein, fluid: <3 g/dL

## 2015-07-08 LAB — LACTATE DEHYDROGENASE: LDH: 156 U/L (ref 98–192)

## 2015-07-08 LAB — ALBUMIN, FLUID (OTHER)

## 2015-07-08 MED ORDER — DIAZEPAM 5 MG PO TABS
5.0000 mg | ORAL_TABLET | Freq: Once | ORAL | Status: AC
  Administered 2015-07-08: 5 mg via ORAL
  Filled 2015-07-08: qty 1

## 2015-07-08 NOTE — Progress Notes (Signed)
Hypoglycemic Event  CBG: 62  Treatment: 15 GM carbohydrate snack & Breakfast  Symptoms: None  Follow-up CBG: Time:0800 CBG Result:82  Possible Reasons for Event: Inadequate meal intake  Comments/MD notified: MD notified    Cathie Hoops

## 2015-07-08 NOTE — Procedures (Signed)
Thoracentesis Procedure Note  Pre-operative Diagnosis: parapneumonic effusion on Right  Post-operative Diagnosis: same  Indications: Pneumonia, dyspnea, effusion  Procedure Details  Consent: Informed consent was obtained. Risks of the procedure were discussed including: infection, bleeding, pain, pneumothorax.  Under sterile conditions the patient was positioned. Betadine solution and sterile drapes were utilized.  1% buffered lidocaine was used to anesthetize the 9th rib space. Fluid was obtained without any difficulties and minimal blood loss.  A dressing was applied to the wound and wound care instructions were provided.   Findings 1100 ml of bloody pleural fluid was obtained. A sample was sent to Pathology for cytogenetics, flow, and cell counts, as well as for infection analysis.  Complications:  None; patient tolerated the procedure well.          Condition: stable  Plan A follow up chest x-ray was ordered. Bed Rest for 0 hours. Tylenol 650 mg. for pain.  Attending Attestation: I performed the procedure.  Roselie Awkward, MD [place] [facility] Pager: [phone] Cell: [phone] After 3pm or if no response, call [phone]

## 2015-07-08 NOTE — Progress Notes (Addendum)
Patient ID: RAEL PHI, male   DOB: March 20, 1971, 45 y.o.   MRN: [id number] [facility] PROGRESS NOTE  NICOLO SZABLEWSKI [id number] DOB: 08/28/1970 DOA: 06/30/2015 PCP: Laurey Morale, MD  Brief narrative:    45 year old male patient with history of HTN, DM 2 with peripheral neuropathy, OSA-non-compliant with CPAP, HLD, chronic kidney disease, anemia, persistent lower extremity edema with recurrent cellulitis who presented to [facility] ED on 06/30/15 with worsening dyspnea of 2-3 days duration, productive cough without fever or chills. Chest x-ray showed pleural effusion and pneumonia.   His hospital course is complicated with persistent pneumonia seen on chest x-ray. He will be seen by pulmonary and may need thoracentesis. He is not stable for discharge at this point.   Assessment/Plan:    Principal problem: Acute respiratory failure with hypoxia / healthcare associated pneumonia, unspecified organism - Chest x-ray 2/15 showed persistent bilateral pulmonary infiltrates consistent with pneumonia - Repeat CXR 07/13/2022 with increased right basilar pneumonia but improved right perihilar and left basilar pneumonia  - CT angio chest on admission showed no pulmonary embolism - Changed cefepime to Levaquin 2022-07-13 - Vancomycin was stopped 07/03/2015 - Appreciate pulmonary consult and their recommendations very much  - Influenza is negative, strep pneumonia and legionella are negative.  - HIV is nonreactive - Continue oxygen support via nasal cannula to keep oxygen saturation above 90%  Active problems:  Large bilateral pleural effusions / LE swelling  - Lasix changed to 40 mg twice daily which he is normally taking at home  - Weight on admission:132.6 kg. Weight in past 48 hours: 128 kg -- > 127.9 kg -Lower extremity venous Doppler - difficult study but negative for DVT and positive for acute superficial vein thrombosis involving a small segment of a varicose vein in the right medial proximal  calf. 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 with long term insulin use - Continue NPH insulin 35 units twice daily along with sliding scale insulin - Continue gabapentin for neuropathy - Monitor for hypoglycemic events; lowest CBG in past 24 hours 62 so we held am NPH  Obstructive sleep apnea - Not compliant with CPAP  Essential hypertension  - Continue Norvasc 10 mg daily, clonidine 0.1 mg twice daily  - Continue Lasix 40 mg PO BID - Continue metoprolol 50 mg twice daily   Chronic kidney disease, stage II - Baseline creatinine 1.3 about one month ago - Creatinine normalized   Anemia of chronic renal failure stage II - Hemoglobin stable at 10.7  Depression - Continue Prozac   DVT Prophylaxis  - Lovenox subQ in hospital   Code Status: Full.  Family Communication:  plan of care discussed with the patient and his wife at the bedside  Disposition Plan: Home 2/22  IV access:  Peripheral IV  Procedures and diagnostic studies:    Dg Chest Port 1 View 2015-07-14 1. Mildly increased right basilar pneumonia and right pleural fluid. 2. Improved right perihilar and left basilar atelectasis or pneumonia. 3. Stable small left pleural effusion. 4. Mild increase in pulmonary vascular congestion and mild interstitial pulmonary edema.   Dg Chest 2 View 07/03/2015  Persistent BILATERAL pulmonary infiltrates consistent with pneumonia. BILATERAL pleural effusions. Electronically Signed   By: Lavonia Dana M.D.   On: 07/03/2015 08:41   Dg Chest 2 View 06/30/2015  Multifocal patchy opacities, suspicious for pneumonia. Suspected small right pleural effusion. Electronically Signed   By:  Julian Hy M.D.   On: 06/30/2015 17:52   Ct Angio Chest Pe W/cm &/or Wo Cm 07/01/2015  1. No pulmonary emboli. 2. Patchy bilateral upper lobe infiltrates with probable reactive mediastinal adenopathy. 3. Large  bilateral pleural effusions with compressive atelectasis of the lower lobes. 4. Nonspecific hepatomegaly and anasarca. Electronically Signed   By: Lorriane Shire M.D.   On: 07/01/2015 09:43   Medical Consultants:  Pulmonary    Other Consultants:  Diabetic coordinator  IAnti-Infectives:   Cefepime 2/12 >2/18 Levofloxacin 2/12 > 2/15; 2/18 --> Vancomycin 2/12 > 2/15   Leisa Lenz, MD  [facility] Pager [phone]  Time spent in minutes: 25 minutes  If 7PM-7AM, please contact night-coverage [email or web address] Password [email or web address] 07/08/2015, 1:36 PM   LOS: 8 days    HPI/Subjective: No acute overnight events. Feels okay.  Objective: Filed Vitals:   07/07/15 2140 07/07/15 2143 07/08/15 0539 07/08/15 0915  BP:  132/72 134/75   Pulse:  60 60   Temp:  98 F (36.7 C) 97.2 F (36.2 C)   TempSrc:  Oral Oral   Resp:  22 21   Height:      Weight:   127.9 kg (281 lb 15.5 oz)   SpO2: 99% 99% 99% 97%    Intake/Output Summary (Last 24 hours) at 07/08/15 1336 Last data filed at 07/08/15 0700  Gross per 24 hour  Intake    480 ml  Output      0 ml  Net    480 ml    Exam:   General:  Pt is alert, no distress  Cardiovascular: RRR, (+) S1, S2  Respiratory: no rhonchi, diminished   Abdomen: non tender, non distended    Extremities: LE pitting edema, palpable pulses   Neuro: Nonfocal  Data Reviewed: Basic Metabolic Panel:  Recent Labs Lab 07/02/15 0514 07/03/15 0450 07/06/15 0536 07/07/15 0550  NA 137 140 139 143  K 4.7 4.3 4.3 4.2  CL 103 103 102 101  CO2 27 30 31  36*  GLUCOSE 110* 101* 119* 111*  BUN 30* 32* 21* 19  CREATININE 1.28* 1.13 0.86 0.77  CALCIUM 8.1* 8.2* 8.2* 8.2*   Liver Function Tests:  Recent Labs Lab 07/02/15 0514  AST 49*  ALT 22  ALKPHOS 157*  BILITOT 0.8  PROT 6.2*  ALBUMIN 2.5*   No results for input(s): LIPASE, AMYLASE in the last 168 hours. No results for input(s): AMMONIA in the last 168 hours. CBC:  Recent Labs Lab  07/02/15 0514 07/06/15 0536 07/07/15 0550  WBC 9.1 6.7 7.0  HGB 10.4* 10.9* 10.7*  HCT 33.7* 35.8* 34.6*  MCV 94.1 93.5 91.5  PLT 324 359 335   Cardiac Enzymes: No results for input(s): CKTOTAL, CKMB, CKMBINDEX, TROPONINI in the last 168 hours. BNP: Invalid input(s): POCBNP CBG:  Recent Labs Lab 07/07/15 1651 07/07/15 2142 07/08/15 0742 07/08/15 0800 07/08/15 1200  GLUCAP 141* 142* 62* 82 124*    Recent Results (from the past 240 hour(s))  Culture, sputum-assessment     Status: None   Collection Time: 07/03/15  1:57 PM  Result Value Ref Range Status   Specimen Description SPUTUM  Final   Special Requests NONE  Final   Sputum evaluation   Final    MICROSCOPIC FINDINGS SUGGEST THAT THIS SPECIMEN IS NOT REPRESENTATIVE OF LOWER RESPIRATORY SECRETIONS. PLEASE RECOLLECT. RESULTS CALLED TO, READ BACK BY AND VERIFIED WITH JESSICA DEUTSCH,RN [id number] @ [place]    Report Status 07/03/2015 FINAL  Final     Scheduled Meds: . albuterol  2.5 mg Nebulization TID  . amLODipine  10 mg Oral Daily  . aspirin  325 mg Oral Daily  . cloNIDine  0.1 mg Oral BID  . enoxaparin (LOVENOX) injection  60 mg Subcutaneous QHS  . FLUoxetine  40 mg Oral BID  . furosemide  40 mg Oral BID  . gabapentin  300 mg Oral TID  . guaiFENesin  600 mg Oral BID  . guaiFENesin-dextromethorphan  5 mL Oral 4 times per day  . insulin aspart  0-9 Units Subcutaneous TID WC  . insulin NPH Human  35 Units Subcutaneous BID AC & HS  . levofloxacin  750 mg Oral Daily  . metoprolol succinate  50 mg Oral BID

## 2015-07-08 NOTE — Consult Note (Signed)
Name: Alex Ryan MRN: [id number] DOB: 05/16/1971    ADMISSION DATE:  06/30/2015 CONSULTATION DATE:  2/20  REFERRING MD :  Charlies Silvers   CHIEF COMPLAINT:  Abnormal CXR and pleural effusions   BRIEF PATIENT DESCRIPTION:  This is a 45 year old male w/ sig h/o DM, peripheral neuropathy, CKD, and recurrent cellulitis. Was admitted on 2/12 w/ working dx of PNA and effusions. Clinically improved but subsequent CXR showed worsening effusions. Diuresis was started but he was still O2 dependent. PCCM was asked to see on 2/20 given abnormal CXR and question re: further evaluation of pleural effusions.   SIGNIFICANT EVENTS    STUDIES:     HISTORY OF PRESENT ILLNESS:   This is a 45 year old male w/ sig h/o DM, peripheral neuropathy, CKD, and recurrent cellulitis. Was admitted on 2/12 w/ 2-3 d h/o shortness of breath and cough productive of clear sputum. Denied fever. Did have some chest pain on the right. Had several potential sick exposures from multiple family members. Noted symptoms were preceded by nasal congestion and sore throat. Admitted w/ working dx of PNA and bilateral effusions. Treated w/ IV antibiotics and IVFs. Clinically improved but subsequent CXR showed worsening effusions. Diuresis was started but he was still O2 dependent. PCCM was asked to see on 2/20 given abnormal CXR and question re: further evaluation of pleural effusions.   PAST MEDICAL HISTORY :   has a past medical history of Hypertension; Anxiety; Shingles (2010); Allergic rhinitis; Diabetes mellitus; Retinopathy of both eyes; Neuropathy in diabetes ([place]); Cellulitis; OSA (obstructive sleep apnea); Recurrent upper respiratory infection (URI); H/O hiatal hernia; Headache(784.0); Retinopathy; Asthma; Chronic kidney disease; Bladder infection; Heart murmur; and Hyperlipidemia.  has past surgical history that includes Tonsillectomy (1983); Eye surgery (2012); Pars plana vitrectomy w/ scleral buckle (09/01/2011); Pars plana  vitrectomy (12/04/2011); and Vitrectomy (2013). Prior to Admission medications   Medication Sig Start Date End Date Taking? Authorizing Provider  albuterol (PROVENTIL HFA;VENTOLIN HFA) 108 (90 BASE) MCG/ACT inhaler Inhale 2 puffs into the lungs every 4 (four) hours as needed for wheezing or shortness of breath. PRN:  Allergies/breathing 06/20/12  Yes Laurey Morale, MD  Alcohol Swabs PADS Use up to 10 times per day for testing and insulin injections, diagnosis code is 250.00 02/01/13  Yes Laurey Morale, MD  amLODipine (NORVASC) 10 MG tablet Take 1 tablet (10 mg total) by mouth daily. 05/09/15  Yes Donne Hazel, MD  aspirin 325 MG tablet Take 325 mg by mouth daily. Reported on 05/29/2015   Yes Historical Provider, MD  cloNIDine (CATAPRES) 0.1 MG tablet Take 1 tablet (0.1 mg total) by mouth 2 (two) times daily. 06/13/15  Yes Laurey Morale, MD  diphenhydrAMINE (BENADRYL) 25 MG tablet Take 25 mg by mouth every 6 (six) hours as needed for allergies.    Yes Historical Provider, MD  FLUoxetine (PROZAC) 40 MG capsule Take 1 capsule (40 mg total) by mouth daily. Patient taking differently: Take 40 mg by mouth 2 (two) times daily.  05/29/14  Yes Laurey Morale, MD  furosemide (LASIX) 40 MG tablet Take 2 tablets (80 mg total) by mouth 2 (two) times daily. Patient taking differently: Take 40 mg by mouth 2 (two) times daily.  06/24/15  Yes Laurey Morale, MD  insulin NPH Human (HUMULIN N) 100 UNIT/ML injection Inject 0.25 mLs (25 Units total) into the skin 2 (two) times daily before a meal. Patient taking differently: Inject 29 Units into the skin 2 (two)  times daily before a meal.  05/09/15  Yes Donne Hazel, MD  insulin regular (HUMULIN R) 100 units/mL injection Inject 0.04 mLs (4 Units total) into the skin 3 (three) times daily before meals. Patient taking differently: Inject 4 Units into the skin 3 (three) times daily before meals. Takes at lunch 05/09/15  Yes Donne Hazel, MD  metoprolol succinate (TOPROL-XL) 50  MG 24 hr tablet Take 1 tablet (50 mg total) by mouth daily. Take with or immediately following a meal. Patient taking differently: Take 50 mg by mouth 2 (two) times daily. Take with or immediately following a meal. 05/21/15  Yes Laurey Morale, MD  Needles & Syringes MISC Inject 2 ml intramuscular every 28 days. 07/26/14  Yes Laurey Morale, MD  Potassium 99 MG TABS Take 99-396 mg by mouth 2 (two) times daily as needed (for low potassium).   Yes Historical Provider, MD  gabapentin (NEURONTIN) 300 MG capsule Take 1 capsule (300 mg total) by mouth 3 (three) times daily. Patient not taking: Reported on 06/30/2015 12/13/13 05/05/16  Laurey Morale, MD  potassium chloride (KLOR-CON 10) 10 MEQ tablet Take 2 tablets (20 mEq total) by mouth 2 (two) times daily. Patient not taking: Reported on 06/30/2015 06/24/15   Laurey Morale, MD   Allergies  Allergen Reactions  . Codeine Hives    Hyper  . Erythromycin Nausea And Vomiting  . Other     Pre-op cleaning wipes:. itching  . Soap Itching    "dial soap"    FAMILY HISTORY:  family history includes Cancer in his father; Diabetes in his maternal grandmother, maternal uncle, and mother; Heart disease in his maternal grandfather; Transient ischemic attack in his father. There is no history of Anesthesia problems. SOCIAL HISTORY:  reports that he quit smoking about 5 years ago. He has quit using smokeless tobacco. He reports that he does not drink alcohol or use illicit drugs.  REVIEW OF SYSTEMS:   Constitutional: Negative for fever, chills, weight loss, malaise/fatigue and diaphoresis.  HENT: Negative for hearing loss, ear pain, nosebleeds, congestion, sore throat, neck pain, tinnitus and ear discharge.   Eyes: Negative for blurred vision, double vision, photophobia, pain, discharge and redness.  Respiratory: resolved cough, hemoptysis, no sputum production, shortness of breath improved, wheezing and stridor.   Cardiovascular: Negative for chest pain, palpitations,  orthopnea, claudication, leg swelling and PND.  Gastrointestinal: Negative for heartburn, nausea, vomiting, abdominal pain, diarrhea, constipation, blood in stool and melena.  Genitourinary: Negative for dysuria, urgency, frequency, hematuria and flank pain.  Musculoskeletal: Negative for myalgias, back pain, joint pain and falls.  Skin: Negative for itching and rash. concerned about scrotal swelling and right arm swelling  Neurological: Negative for dizziness, tingling, tremors, sensory change, speech change, focal weakness, seizures, loss of consciousness, weakness and headaches.  Endo/Heme/Allergies: Negative for environmental allergies and polydipsia. Does not bruise/bleed easily.  SUBJECTIVE:  Feels better  VITAL SIGNS: Temp:  [97.2 F (36.2 C)-98 F (36.7 C)] 98 F (36.7 C) (02/20 1423) Pulse Rate:  [60-68] 68 (02/20 1423) Resp:  [20-22] 20 (02/20 1423) BP: (132-139)/(72-75) 139/72 mmHg (02/20 1423) SpO2:  [97 %-99 %] 97 % (02/20 1423) Weight:  [281 lb 15.5 oz (127.9 kg)] 281 lb 15.5 oz (127.9 kg) (02/20 0539)  PHYSICAL EXAMINATION: General:  46 year old white male, resting comfortably no distress  Neuro:  Awake, alert, no focal def  HEENT:  MMM, no JVD  Cardiovascular:  rrr w/out MRG  Lungs:  Decreased  on the right >left base. No wheeze or rhonchi  Abdomen:  Soft, not tender + bowel sounds  Musculoskeletal:  Good strength and bulk  Skin:  Scrotal edema, Right arm swollen and wrapped w/ kerlex    Recent Labs Lab 07/03/15 0450 07/06/15 0536 07/07/15 0550  NA 140 139 143  K 4.3 4.3 4.2  CL 103 102 101  CO2 30 31 36*  BUN 32* 21* 19  CREATININE 1.13 0.86 0.77  GLUCOSE 101* 119* 111*    Recent Labs Lab 07/02/15 0514 07/06/15 0536 07/07/15 0550  HGB 10.4* 10.9* 10.7*  HCT 33.7* 35.8* 34.6*  WBC 9.1 6.7 7.0  PLT 324 359 335   No results found.  ASSESSMENT / PLAN:  Acute Hypoxic respiratory failure in setting of HCAP w/ bilateral pleural effusions.  Could  still be residual volume overload but given what seems a compelling story for CAP needs diagnostic evaluation of right pleural fluid.  Plan Cont current abx Diagnostic/therapeutic thoracentesis to evaluate right pleural fluid... Will send complete fluid analysis IF exudate will need thoracic eval  Repeat CXR in am  Wean FIO2 Mobilize   OSA Plan O2 at night   HTN, stage 2 CKD, DM, anemia of chronic disease -->per primary service.   Erick Colace ACNP-BC [place] Pager # [phone] OR # [phone] if no answer   07/08/2015, 3:03 PM

## 2015-07-08 NOTE — Progress Notes (Signed)
Dressing change complete on legs. 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 Patient tolerated well.

## 2015-07-09 DIAGNOSIS — J189 Pneumonia, unspecified organism: Secondary | ICD-10-CM | POA: Insufficient documentation

## 2015-07-09 LAB — PH, BODY FLUID: pH, Body Fluid: 8.2

## 2015-07-09 LAB — GLUCOSE, CAPILLARY
GLUCOSE-CAPILLARY: 128 mg/dL — AB (ref 65–99)
Glucose-Capillary: 73 mg/dL (ref 65–99)
Glucose-Capillary: 90 mg/dL (ref 65–99)

## 2015-07-09 MED ORDER — IBUPROFEN 200 MG PO TABS
400.0000 mg | ORAL_TABLET | Freq: Four times a day (QID) | ORAL | Status: DC | PRN
  Filled 2015-07-09: qty 2

## 2015-07-09 MED ORDER — INSULIN NPH (HUMAN) (ISOPHANE) 100 UNIT/ML ~~LOC~~ SUSP
17.0000 [IU] | Freq: Once | SUBCUTANEOUS | Status: AC
  Administered 2015-07-09: 17 [IU] via SUBCUTANEOUS
  Filled 2015-07-09: qty 10

## 2015-07-09 MED ORDER — ALBUTEROL SULFATE (2.5 MG/3ML) 0.083% IN NEBU
2.5000 mg | INHALATION_SOLUTION | Freq: Two times a day (BID) | RESPIRATORY_TRACT | Status: DC
Start: 2015-07-09 — End: 2015-07-10
  Administered 2015-07-09 – 2015-07-10 (×2): 2.5 mg via RESPIRATORY_TRACT
  Filled 2015-07-09 (×2): qty 3

## 2015-07-09 MED ORDER — DOXYCYCLINE HYCLATE 100 MG PO TABS
100.0000 mg | ORAL_TABLET | Freq: Two times a day (BID) | ORAL | Status: DC
  Administered 2015-07-09 – 2015-07-10 (×3): 100 mg via ORAL
  Filled 2015-07-09 (×3): qty 1

## 2015-07-09 NOTE — Progress Notes (Addendum)
Patient ID: Alex Ryan, male   DOB: Sep 28, 1970, 45 y.o.   MRN: [id number] [facility] PROGRESS NOTE  JONHATAN GILKISON [id number] DOB: [birth year] DOA: 06/30/2015 PCP: Laurey Morale, MD  Brief narrative:    45 year old male patient with history of HTN, DM 2 with peripheral neuropathy, OSA-non-compliant with CPAP, HLD, chronic kidney disease, anemia, persistent lower extremity edema with recurrent cellulitis who presented to [facility] ED on 06/30/15 with worsening dyspnea of 2-3 days duration, productive cough without fever or chills. Chest x-ray showed pleural effusion and pneumonia.   His hospital course is complicated with persistent pneumonia seen on chest x-ray.   He underwent thoracentesis 2/20 with 1.1 L fluid removed and sent for analysis.  Assessment/Plan:    Principal problem: Acute respiratory failure with hypoxia / healthcare associated pneumonia, unspecified organism - Patient with persistent pneumonia had a repeat chest x-ray 08/03/22 with increased right basilar infiltrates - Pulmonary consulted and patient underwent thoracentesis 07/08/2015 with 1.1 L fluid removed and sent for analysis. Post thoracentesis x-ray with no pneumothorax - He was on broad-spectrum antibiotics, vancomycin and cefepime. Vancomycin was stopped 07/03/2015 and we changed cefepime to Levaquin on 04-Aug-2015 - Influenza is negative, strep pneumonia and legionella are negative.  - HIV is nonreactive - Continue oxygen support via nasal cannula to keep oxygen saturation above 90%  Active problems:  Large bilateral pleural effusions / LE swelling  - Continue Lasix 40 mg by mouth twice daily per home regimen - Continue daily weight - Lower extremity Doppler negative for DVT - Weight on admission:132.6 kg. 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 with long  term insulin use - We will continue NPH insulin 35 units twice daily along with sliding scale insulin - We will continue gabapentin for neuropathy   Groin cellulitis - Start doxy 100 mg BID  Obstructive sleep apnea - Not compliant with CPAP  Essential hypertension  - Continue Norvasc, Clonidine, Lasix, metoprolol  Chronic kidney disease, stage II - Baseline creatinine 1.3 about one month ago - Creatinine normalized   Anemia of chronic renal failure stage II - Hemoglobin stable  - No current indications for transfusion  Depression - Continue Prozac  DVT Prophylaxis  - Lovenox subQ ordered   Code Status: Full.  Family Communication:  plan of care discussed with the patient and his wife at the bedside  Disposition Plan: Home likely 07/10/2015.   IV access:  Peripheral IV  Procedures and diagnostic studies:    Dg Chest Port 1 View 08-04-2015 1. Mildly increased right basilar pneumonia and right pleural fluid. 2. Improved right perihilar and left basilar atelectasis or pneumonia. 3. Stable small left pleural effusion. 4. Mild increase in pulmonary vascular congestion and mild interstitial pulmonary edema.   Dg Chest 2 View 07/03/2015  Persistent BILATERAL pulmonary infiltrates consistent with pneumonia. BILATERAL pleural effusions. Electronically Signed   By: Lavonia Dana M.D.   On: 07/03/2015 08:41   Dg Chest 2 View 06/30/2015  Multifocal patchy opacities, suspicious for pneumonia. Suspected small right pleural effusion. Electronically Signed   By: Julian Hy M.D.   On: 06/30/2015 17:52   Ct Angio Chest Pe W/cm &/or Wo Cm 07/01/2015  1. No pulmonary emboli. 2. Patchy bilateral upper lobe infiltrates with probable reactive mediastinal adenopathy. 3. Large bilateral pleural effusions with compressive atelectasis of the lower lobes. 4.  Nonspecific hepatomegaly and anasarca. Electronically Signed   By: Lorriane Shire M.D.   On: 07/01/2015 09:43   Thoracentesis 07/08/2015 with 1.1 l  fluid removed, bloody pleural  Dg Chest Port 1 View 07/08/2015  1. No pneumothorax after right thoracentesis. 2. Near complete resolution of the right pleural effusion with improved aeration in the right lower lobe. Only minimal linear atelectasis persists. 3. Stable moderate-sized left pleural effusion and associated dense passive atelectasis and/or pneumonia in the left lower lobe. 4. Interval resolution of interstitial pulmonary edema.   Medical Consultants:  Pulmonary    Other Consultants:  Diabetic coordinator  IAnti-Infectives:   Cefepime 2/12 >2/18 Levofloxacin 2/12 > 2/15; 2/18 --> Vancomycin 2/12 > 2/15   Leisa Lenz, MD  [facility] Pager [phone]  Time spent in minutes: 15 minutes  If 7PM-7AM, please contact night-coverage [email or web address] Password [facility] 07/09/2015, 12:15 PM   LOS: 9 days    HPI/Subjective: No acute overnight events. Feels better this am.   Objective: Filed Vitals:   07/08/15 2051 07/08/15 2110 07/09/15 0400 07/09/15 0859  BP:  136/68 138/73   Pulse:  65 63   Temp:  98.1 F (36.7 C) 98 F (36.7 C)   TempSrc:  Oral Oral   Resp:  20 20   Height:      Weight:   127.4 kg (280 lb 13.9 oz)   SpO2: 98% 98% 97% 96%   No intake or output data in the 24 hours ending 07/09/15 1215  Exam:   General:  Pt is not in distress   Cardiovascular: Rate controlled, S1, S2 appreciated   Respiratory: better air entry, no wheezing   Abdomen: (+) BS, non tender   Extremities: LE pitting edema stable, (+1), palpable pulses   Neuro: No focal deficits   Data Reviewed: Basic Metabolic Panel:  Recent Labs Lab 07/03/15 0450 07/06/15 0536 07/07/15 0550  NA 140 139 143  K 4.3 4.3 4.2  CL 103 102 101  CO2 30 31 36*  GLUCOSE 101* 119* 111*  BUN 32* 21* 19  CREATININE 1.13 0.86 0.77  CALCIUM 8.2* 8.2* 8.2*   Liver Function Tests:  Recent Labs Lab 07/08/15 1950  PROT 6.2*   No results for input(s): LIPASE, AMYLASE in the last 168  hours. No results for input(s): AMMONIA in the last 168 hours. CBC:  Recent Labs Lab 07/06/15 0536 07/07/15 0550  WBC 6.7 7.0  HGB 10.9* 10.7*  HCT 35.8* 34.6*  MCV 93.5 91.5  PLT 359 335   Cardiac Enzymes: No results for input(s): CKTOTAL, CKMB, CKMBINDEX, TROPONINI in the last 168 hours. BNP: Invalid input(s): POCBNP CBG:  Recent Labs Lab 07/08/15 1200 07/08/15 1706 07/08/15 2106 07/09/15 0748 07/09/15 1200  GLUCAP 124* 128* 197* 73 128*    Recent Results (from the past 240 hour(s))  Culture, sputum-assessment     Status: None   Collection Time: 07/03/15  1:57 PM  Result Value Ref Range Status   Specimen Description SPUTUM  Final   Special Requests NONE  Final   Sputum evaluation   Final    MICROSCOPIC FINDINGS SUGGEST THAT THIS SPECIMEN IS NOT REPRESENTATIVE OF LOWER RESPIRATORY SECRETIONS. PLEASE RECOLLECT. RESULTS CALLED TO, READ BACK BY AND VERIFIED WITH JESSICA DEUTSCH,RN [id number] @ [id number] BY J SCOTTON    Report Status 07/03/2015 FINAL  Final  Body fluid culture     Status: None (Preliminary result)   Collection Time: 07/08/15  5:06 PM  Result Value Ref Range Status   Specimen  Description PLEURAL RIGHT  Final   Special Requests Normal  Final   Gram Stain   Final    RARE WBC PRESENT, PREDOMINANTLY MONONUCLEAR NO ORGANISMS SEEN Performed at [facility]    Culture PENDING  Incomplete   Report Status PENDING  Incomplete     Scheduled Meds: . albuterol  2.5 mg Nebulization TID  . amLODipine  10 mg Oral Daily  . aspirin  325 mg Oral Daily  . cloNIDine  0.1 mg Oral BID  . enoxaparin (LOVENOX) injection  60 mg Subcutaneous QHS  . FLUoxetine  40 mg Oral BID  . furosemide  40 mg Oral BID  . gabapentin  300 mg Oral TID  . guaiFENesin  600 mg Oral BID  . guaiFENesin-dextromethorphan  5 mL Oral 4 times per day  . insulin aspart  0-9 Units Subcutaneous TID WC  . insulin NPH Human  35 Units Subcutaneous BID AC & HS  . levofloxacin  750 mg Oral Daily  .  metoprolol succinate  50 mg Oral BID

## 2015-07-09 NOTE — Progress Notes (Signed)
   Name: Alex Ryan MRN: [id number] DOB: 1970/09/30    ADMISSION DATE:  06/30/2015 CONSULTATION DATE:  2/20  REFERRING MD :  Charlies Silvers   CHIEF COMPLAINT:  Abnormal CXR and pleural effusions   BRIEF PATIENT DESCRIPTION:  This is a 45 year old male w/ sig h/o DM, peripheral neuropathy, CKD, and recurrent cellulitis. Was admitted on 2/12 w/ working dx of PNA and effusions. Clinically improved but subsequent CXR showed worsening effusions. Diuresis was started but he was still O2 dependent. PCCM was asked to see on 2/20 given abnormal CXR and question re: further evaluation of pleural effusions.   SIGNIFICANT EVENTS    STUDIES:     SUBJECTIVE:  Feels well     VITAL SIGNS: Temp:  [98 F (36.7 C)-98.1 F (36.7 C)] 98 F (36.7 C) (02/21 0400) Pulse Rate:  [63-68] 63 (02/21 0400) Resp:  [20] 20 (02/21 0400) BP: (136-139)/(68-73) 138/73 mmHg (02/21 0400) SpO2:  [96 %-98 %] 96 % (02/21 0859) Weight:  [127.4 kg (280 lb 13.9 oz)] 127.4 kg (280 lb 13.9 oz) (02/21 0400)  PHYSICAL EXAMINATION: General:  No distress HENT: NCAT OP Clear PULM: CTA R, diminished left base CV: RRR, no mgr GI: BS+, soft, nontender MSK: normal bulk, tone Derm: UNA boots bilaterally, chronic edema Neuro: A&OX4, maew   Recent Labs Lab 07/03/15 0450 07/06/15 0536 07/07/15 0550  NA 140 139 143  K 4.3 4.3 4.2  CL 103 102 101  CO2 30 31 36*  BUN 32* 21* 19  CREATININE 1.13 0.86 0.77  GLUCOSE 101* 119* 111*    Recent Labs Lab 07/06/15 0536 07/07/15 0550  HGB 10.9* 10.7*  HCT 35.8* 34.6*  WBC 6.7 7.0  PLT 359 335   2/20 CXR images personally reviewed> left effusion (small), R effusion now gone with no residual airspace disease  Pleural fluid: WBC 916, 65% lymph, Albumin < 1.0, LDH 90, Total protein < 3.0  ASSESSMENT / PLAN:  Pleural effusion> transudate.  Ddx includes CHF, nephrosis, cirrhosis.  Given the 07/2014 urine microalbumin value I think he has nephrotic syndrome likely from  diabetic nephropathy; however would consider working up for cirrhosis.  If no evidence of nephrosis or cirrhosis then would considr a right heart catherization.   Acute Hypoxic respiratory failure>I think this is most likely pulmonary edema, doubt pneumonia  Plan Wean O2 Out of bed Incentive spirometry 24 hour urine cr/protein ratio> is there nephrotic syndrome? RUQ ultrasound> is there cirrhosis? Would diurese Would consider stopping antibiotics   OSA Plan O2 at night   HTN, stage 2 CKD, DM, anemia of chronic disease -->per primary service.   Will follow  Roselie Awkward, MD  PCCM Pager: [phone] Cell: [phone] After 3pm or if no response, call [phone]   07/09/2015, 12:39 PM

## 2015-07-10 ENCOUNTER — Inpatient Hospital Stay (HOSPITAL_COMMUNITY): Payer: Medicaid Other

## 2015-07-10 DIAGNOSIS — J90 Pleural effusion, not elsewhere classified: Secondary | ICD-10-CM | POA: Insufficient documentation

## 2015-07-10 LAB — BASIC METABOLIC PANEL
ANION GAP: 6 (ref 5–15)
BUN: 18 mg/dL (ref 6–20)
CO2: 34 mmol/L — AB (ref 22–32)
Calcium: 8.3 mg/dL — ABNORMAL LOW (ref 8.9–10.3)
Chloride: 100 mmol/L — ABNORMAL LOW (ref 101–111)
Creatinine, Ser: 1.02 mg/dL (ref 0.61–1.24)
GLUCOSE: 246 mg/dL — AB (ref 65–99)
POTASSIUM: 4.7 mmol/L (ref 3.5–5.1)
Sodium: 140 mmol/L (ref 135–145)

## 2015-07-10 LAB — CBC
HEMATOCRIT: 34.8 % — AB (ref 39.0–52.0)
Hemoglobin: 10.6 g/dL — ABNORMAL LOW (ref 13.0–17.0)
MCH: 28.6 pg (ref 26.0–34.0)
MCHC: 30.5 g/dL (ref 30.0–36.0)
MCV: 93.8 fL (ref 78.0–100.0)
Platelets: 303 10*3/uL (ref 150–400)
RBC: 3.71 MIL/uL — AB (ref 4.22–5.81)
RDW: 13.4 % (ref 11.5–15.5)
WBC: 6.8 10*3/uL (ref 4.0–10.5)

## 2015-07-10 LAB — GLUCOSE, CAPILLARY
GLUCOSE-CAPILLARY: 190 mg/dL — AB (ref 65–99)
GLUCOSE-CAPILLARY: 212 mg/dL — AB (ref 65–99)
GLUCOSE-CAPILLARY: 65 mg/dL (ref 65–99)
Glucose-Capillary: 205 mg/dL — ABNORMAL HIGH (ref 65–99)
Glucose-Capillary: 48 mg/dL — ABNORMAL LOW (ref 65–99)
Glucose-Capillary: 106 mg/dL — ABNORMAL HIGH (ref 65–99)

## 2015-07-10 MED ORDER — FUROSEMIDE 40 MG PO TABS
40.0000 mg | ORAL_TABLET | Freq: Two times a day (BID) | ORAL | Status: DC
Start: 2015-07-10 — End: 2016-01-31

## 2015-07-10 MED ORDER — GABAPENTIN 300 MG PO CAPS: 300.0000 mg | ORAL_CAPSULE | Freq: Three times a day (TID) | ORAL | Status: DC

## 2015-07-10 MED ORDER — POTASSIUM CHLORIDE ER 20 MEQ PO TBCR: 20.0000 meq | EXTENDED_RELEASE_TABLET | Freq: Every day | ORAL | Status: DC

## 2015-07-10 MED ORDER — DOXYCYCLINE HYCLATE 100 MG PO TABS: 100.0000 mg | ORAL_TABLET | Freq: Two times a day (BID) | ORAL | Status: DC

## 2015-07-10 MED ORDER — ALBUTEROL SULFATE HFA 108 (90 BASE) MCG/ACT IN AERS: 2.0000 | INHALATION_SPRAY | RESPIRATORY_TRACT | Status: AC | PRN

## 2015-07-10 NOTE — Progress Notes (Signed)
Patient given discharge, medication, and follow up instructions, verbalized understanding, prescriptions given, No IV access at time of discharge, family to transport home

## 2015-07-10 NOTE — Discharge Instructions (Signed)
Doxycycline delayed-release tablets What is this medicine? DOXYCYCLINE (dox i SYE kleen) is a tetracycline antibiotic. It kills certain bacteria or stops their growth. It is used to treat many kinds of infections, like dental, skin, respiratory, and urinary tract infections. It also treats acne, Lyme disease, malaria, and certain sexually transmitted infections. This medicine may be used for other purposes; ask your health care provider or pharmacist if you have questions. What should I tell my health care provider before I take this medicine? They need to know if you have any of these conditions: -bowel disease like colitis -liver disease -long exposure to sunlight like working outdoors -an unusual or allergic reaction to doxycycline, tetracycline antibiotics, other medicines, foods, dyes, or preservatives -pregnant or trying to get pregnant -breast-feeding How should I use this medicine? Take this medicine by mouth with a full glass of water. Follow the directions on the prescription label. Do not crush or chew. It is best to take this medicine without food, but if it upsets your stomach take it with food. Take your medicine at regular intervals. Do not take your medicine more often than directed. Take all of your medicine as directed even if you think your are better. Do not skip doses or stop your medicine early. Talk to your pediatrician regarding the use of this medicine in children. While this drug may be prescribed for selected conditions, precautions do apply. Overdosage: If you think you have taken too much of this medicine contact a poison control center or emergency room at once. NOTE: This medicine is only for you. Do not share this medicine with others. What if I miss a dose? If you miss a dose, take it as soon as you can. If it is almost time for your next dose, take only that dose. Do not take double or extra doses. What may interact with this  medicine? -antacids -barbiturates -birth control pills -bismuth subsalicylate -carbamazepine -methoxyflurane -other antibiotics -phenytoin -vitamins that contain iron -warfarin This list may not describe all possible interactions. Give your health care provider a list of all the medicines, herbs, non-prescription drugs, or dietary supplements you use. Also tell them if you smoke, drink alcohol, or use illegal drugs. Some items may interact with your medicine. What should I watch for while using this medicine? Tell your doctor or health care professional if your symptoms do not improve. Do not treat diarrhea with over the counter products. Contact your doctor if you have diarrhea that lasts more than 2 days or if it is severe and watery. Do not take this medicine just before going to bed. It may not dissolve properly when you lay down and can cause pain in your throat. Drink plenty of fluids while taking this medicine to also help reduce irritation in your throat. This medicine can make you more sensitive to the sun. Keep out of the sun. If you cannot avoid being in the sun, wear protective clothing and use sunscreen. Do not use sun lamps or tanning beds/booths. Birth control pills may not work properly while you are taking this medicine. Talk to your doctor about using an extra method of birth control. If you are being treated for a sexually transmitted infection, avoid sexual contact until you have finished your treatment. Your sexual partner may also need treatment. Avoid antacids, aluminum, calcium, magnesium, and iron products for 4 hours before and 2 hours after taking a dose of this medicine. If you are using this medicine to prevent malaria, you should still  protect yourself from contact with mosquitos. Stay in screened-in areas, use mosquito nets, keep your body covered, and use an insect repellent. What side effects may I notice from receiving this medicine? Side effects that you  should report to your doctor or health care professional as soon as possible: -allergic reactions like skin rash, itching or hives, swelling of the face, lips, or tongue -difficulty breathing -fever -itching in the rectal or genital area -pain on swallowing -redness, blistering, peeling or loosening of the skin, including inside the mouth -severe stomach pain or cramps -unusual bleeding or bruising -unusually weak or tired -yellowing of the eyes or skin Side effects that usually do not require medical attention (report to your doctor or health care professional if they continue or are bothersome): -diarrhea -loss of appetite -nausea, vomiting This list may not describe all possible side effects. Call your doctor for medical advice about side effects. You may report side effects to FDA at 1-800-FDA-1088. Where should I keep my medicine? Keep out of the reach of children. Store at room temperature between 15 and 30 degrees C (59 and 86 degrees F). Protect from light. Keep container tightly closed. Throw away any unused medicine after the expiration date. Taking this medicine after the expiration date can make you seriously ill. NOTE: This sheet is a summary. It may not cover all possible information. If you have questions about this medicine, talk to your doctor, pharmacist, or health care provider.    2016, [employer]. (2014-08-24 12:07:47)

## 2015-07-10 NOTE — Progress Notes (Signed)
Pt CBG 212 this am. Pt NPO at current time. Decreased HS insulin per MD ordered due to NPO status. Will cont to monitor. SRP, RN

## 2015-07-10 NOTE — Progress Notes (Signed)
   Name: Alex Ryan MRN: [id number] DOB: 04/15/1971    ADMISSION DATE:  06/30/2015 CONSULTATION DATE:  2/20  REFERRING MD :  Charlies Silvers   CHIEF COMPLAINT:  Abnormal CXR and pleural effusions   BRIEF PATIENT DESCRIPTION:  This is a 45 year old male w/ sig h/o DM, peripheral neuropathy, CKD, and recurrent cellulitis. Was admitted on 2/12 w/ working dx of PNA and effusions. Clinically improved but subsequent CXR showed worsening effusions. Diuresis was started but he was still O2 dependent. PCCM was asked to see on 2/20 given abnormal CXR and question re: further evaluation of pleural effusions.   SIGNIFICANT EVENTS    STUDIES:     SUBJECTIVE:  Feels better     VITAL SIGNS: Temp:  [98 F (36.7 C)] 98 F (36.7 C) (02/21 2102) Pulse Rate:  [65] 65 (02/21 2102) Resp:  [18] 18 (02/21 2102) BP: (129-147)/(64-83) 129/64 mmHg (02/22 0935) SpO2:  [91 %-100 %] 96 % (02/22 0957)  PHYSICAL EXAMINATION: General:  No distress HENT: NCAT OP Clear PULM: CTA R, diminished left base CV: RRR, no mgr GI: BS+, soft, nontender MSK: normal bulk, tone Derm: UNA boots bilaterally, chronic edema Neuro: A&OX4, maew   Recent Labs Lab 07/06/15 0536 07/07/15 0550 07/10/15 0504  NA 139 143 140  K 4.3 4.2 4.7  CL 102 101 100*  CO2 31 36* 34*  BUN 21* 19 18  CREATININE 0.86 0.77 1.02  GLUCOSE 119* 111* 246*    Recent Labs Lab 07/06/15 0536 07/07/15 0550 07/10/15 0504  HGB 10.9* 10.7* 10.6*  HCT 35.8* 34.6* 34.8*  WBC 6.7 7.0 6.8  PLT 359 335 303   2/20 CXR images personally reviewed> left effusion (small), R effusion now gone with no residual airspace disease  Pleural fluid: WBC 916, 65% lymph, Albumin < 1.0, LDH 90, Total protein < 3.0  ASSESSMENT / PLAN:  Pleural effusion> transudate.  Ddx includes CHF, nephrosis, cirrhosis.  Given the 07/2014 urine microalbumin value I think he has nephrotic syndrome likely from diabetic nephropathy; however would consider working up for  cirrhosis.  If no evidence of nephrosis or cirrhosis then would considr a right heart catherization.  Sadly, last night someone working on the nursing unit dumped his 24 hour urine collection so we were unable to see if he has nephrotic range proteinuria.    Acute Hypoxic respiratory failure>I think this is most likely pulmonary edema, doubt pneumonia  Plan F/u with [facility] with CXR for pleural effusion 24 hour urine cr/protein ratio> will need to be done as outpatient  Fatty liver on RUQ ultrasound > suspect this is due to metabolic syndrome, likely needs to see GI as outpatient  OSA Plan O2 at night   Roselie Awkward, MD [place] [facility] Pager: [phone] Cell: [phone] After 3pm or if no response, call [phone]   07/10/2015, 6:21 PM

## 2015-07-10 NOTE — Discharge Summary (Signed)
Physician Discharge Summary  Alex Ryan [id number] DOB: 11-08-1970 DOA: 06/30/2015  PCP: Laurey Morale, MD  Admit date: 06/30/2015 Discharge date: 07/10/2015  Recommendations for Outpatient Follow-up:  Patient will follow up in pulmonary office per scheduled appointment Patient will continue doxycycline for 7-10 days on discharge for groin cellulitis   Discharge Diagnoses:  Principal Problem:   Acute respiratory failure with hypoxia ([place]) Active Problems:   HCAP (healthcare-associated pneumonia)   Pleural effusion, bilateral   Essential hypertension   OSA (obstructive sleep apnea)   Acute respiratory failure (HCC)   CAP (community acquired pneumonia)   Discharge Condition: stable   Diet recommendation: as tolerated   History of present illness:  45 year old male patient with history of HTN, DM 2 with peripheral neuropathy, OSA-non-compliant with CPAP, HLD, chronic kidney disease, anemia, persistent lower extremity edema with recurrent cellulitis who presented to [facility] long ED on 06/30/15 with worsening dyspnea of 2-3 days duration, productive cough without fever or chills. Chest x-ray showed pleural effusion and pneumonia.   His hospital course is complicated with persistent pneumonia seen on chest x-ray.   He underwent thoracentesis 2/20 with 1.1 L fluid removed and sent for analysis.  Hospital Course:   Assessment/Plan:    Principal problem: Acute respiratory failure with hypoxia / healthcare associated pneumonia, unspecified organism - Patient with persistent pneumonia had a repeat chest x-ray 2/17 with increased right basilar infiltrates - Patient underwent thoracentesis 07/08/2015 with 1.1 L fluid removed and sent for analysis. Post thoracentesis x-ray with no pneumothorax - Patient was on broad-spectrum antibiotics, vancomycin and cefepime. Vancomycin was stopped 07/03/2015 and we changed cefepime to Levaquin on 07/06/2015 - Per pulmonary will stop Levaquin  prior to discharge  - Influenza is negative, strep pneumonia and legionella are negative.  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 - Continue insulin per home regimen  - Continue gabapentin for neuropathy  Groin cellulitis - Prescription provided for doxycycline 100 mg twice daily for groin cellulitis which he will take for 7 days and he cellulitis not better he will continue for 3 more days after that   Obstructive sleep apnea - Not compliant with CPAP in hospital   Essential hypertension  - Continue Norvasc, Clonidine, Lasix, metoprolol on discharge   Chronic kidney disease, stage II - Baseline creatinine 1.3 about one month ago - Creatinine normalized   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  Family Communication: plan of care discussed with the patient and his wife at the bedside    IV access:  Peripheral IV  Procedures and diagnostic studies:   Dg Chest Port 1 View 07/06/2015 1. Mildly increased right basilar pneumonia and right pleural fluid. 2. Improved right perihilar and left basilar atelectasis or pneumonia. 3. Stable small left pleural effusion. 4. Mild increase in pulmonary vascular congestion and mild interstitial pulmonary edema.   Dg Chest 2 View 07/03/2015 Persistent BILATERAL pulmonary infiltrates consistent with pneumonia. BILATERAL pleural effusions. Electronically Signed By: Lavonia Dana M.D. On: 07/03/2015 08:41   Dg Chest 2 View 06/30/2015 Multifocal  patchy opacities, suspicious for  pneumonia. Suspected small right pleural effusion. Electronically Signed By: Julian Hy M.D. On: 06/30/2015 17:52   Ct Angio Chest Pe W/cm &/or Wo Cm 07/01/2015 1. No pulmonary emboli. 2. Patchy bilateral upper lobe infiltrates with probable reactive mediastinal adenopathy. 3. Large bilateral pleural effusions with compressive atelectasis of the lower lobes. 4. Nonspecific hepatomegaly and anasarca. Electronically Signed By: Lorriane Shire M.D. On: 07/01/2015 09:43   Thoracentesis 07/08/2015 with 1.1 l fluid removed, bloody pleural  Dg Chest Port 1 View 07/08/2015 1. No pneumothorax after right thoracentesis. 2. Near complete resolution of the right pleural effusion with improved aeration in the right lower lobe. Only minimal linear atelectasis persists. 3. Stable moderate-sized left pleural effusion and associated dense passive atelectasis and/or pneumonia in the left lower lobe. 4. Interval resolution of interstitial pulmonary edema.   Medical Consultants:  Pulmonary   Other Consultants:  Diabetic coordinator  IAnti-Infectives:   Cefepime 2/12 >2/18 Levofloxacin 2/12 > 2/15; 2/18 --> 07/10/2015 Vancomycin 2/12 > 2/15   Signed:  Leisa Lenz, MD  [facility] 07/10/2015, 12:32 PM  Pager #: [phone]  Time spent in minutes: more than 30 minutes   Discharge Exam: Filed Vitals:   07/09/15 2102 07/10/15 0935  BP: 147/83 129/64  Pulse: 65   Temp: 98 F (36.7 C)   Resp: 18    Filed Vitals:   07/09/15 2102 07/09/15 2130 07/10/15 0935 07/10/15 0957  BP: 147/83  129/64   Pulse: 65     Temp: 98 F (36.7 C)     TempSrc: Oral     Resp: 18     Height:      Weight:      SpO2: 100% 91%  96%    General: Pt is alert, follows commands appropriately, not in acute distress Cardiovascular: Regular rate and rhythm, S1/S2 + Respiratory: Clear to auscultation bilaterally, no wheezing, no crackles, no rhonchi Abdominal: Soft, non tender, non distended,  bowel sounds +, no guarding Extremities: no cyanosis, pulses palpable bilaterally DP and PT Neuro: Grossly nonfocal  Discharge Instructions  Discharge Instructions    Call MD for:  difficulty breathing, headache or visual disturbances    Complete by:  As directed      Call MD for:  persistant dizziness or light-headedness    Complete by:  As directed      Call MD for:  persistant nausea and vomiting    Complete by:  As directed      Call MD for:  severe uncontrolled pain    Complete by:  As directed      Diet - low sodium heart healthy    Complete by:  As directed      Increase activity slowly    Complete by:  As directed             Medication List    STOP taking these medications        cephALEXin 500 MG capsule  Commonly known as:  KEFLEX     Potassium 99 MG Tabs      TAKE these medications        albuterol 108 (90 Base) MCG/ACT inhaler  Commonly known as:  PROVENTIL HFA;VENTOLIN HFA  Inhale 2 puffs into the lungs every 4 (four) hours as needed for wheezing or shortness of breath. PRN:  Allergies/breathing     Alcohol Swabs Pads  Use up to 10 times per day for testing and insulin injections, diagnosis code is 250.00  amLODipine 10 MG tablet  Commonly known as:  NORVASC  Take 1 tablet (10 mg total) by mouth daily.     aspirin 325 MG tablet  Take 325 mg by mouth daily. Reported on 05/29/2015     cloNIDine 0.1 MG tablet  Commonly known as:  CATAPRES  Take 1 tablet (0.1 mg total) by mouth 2 (two) times daily.     diphenhydrAMINE 25 MG tablet  Commonly known as:  BENADRYL  Take 25 mg by mouth every 6 (six) hours as needed for allergies.     doxycycline 100 MG tablet  Commonly known as:  VIBRA-TABS  Take 1 tablet (100 mg total) by mouth every 12 (twelve) hours.     FLUoxetine 40 MG capsule  Commonly known as:  PROZAC  Take 1 capsule (40 mg total) by mouth daily.     furosemide 40 MG tablet  Commonly known as:  LASIX  Take 1 tablet (40 mg total) by  mouth 2 (two) times daily.     gabapentin 300 MG capsule  Commonly known as:  NEURONTIN  Take 1 capsule (300 mg total) by mouth 3 (three) times daily.     insulin NPH Human 100 UNIT/ML injection  Commonly known as:  HUMULIN N  Inject 0.25 mLs (25 Units total) into the skin 2 (two) times daily before a meal.     insulin regular 100 units/mL injection  Commonly known as:  HUMULIN R  Inject 0.04 mLs (4 Units total) into the skin 3 (three) times daily before meals.     metoprolol succinate 50 MG 24 hr tablet  Commonly known as:  TOPROL-XL  Take 1 tablet (50 mg total) by mouth daily. Take with or immediately following a meal.     Needles & Syringes Misc  Inject 2 ml intramuscular every 28 days.     Potassium Chloride ER 20 MEQ Tbcr  Commonly known as:  KLOR-CON 10  Take 20 mEq by mouth daily.           Follow-up Information    Follow up with FRY,STEPHEN A, MD. Schedule an appointment as soon as possible for a visit in 1 week.   Specialty:  Family Medicine   Why:  Follow up appt after recent hospitalization   Contact information:   [address] [place] [address] [phone]        The results of significant diagnostics from this hospitalization (including imaging, microbiology, ancillary and laboratory) are listed below for reference.    Significant Diagnostic Studies: Dg Chest 2 View  07/03/2015  CLINICAL DATA:  Shortness of breath, recent pneumonia, pleural effusion, history hypertension, diabetes mellitus, asthma, former smoker EXAM: CHEST  2 VIEW COMPARISON:  06/30/2015 ; correlation interval CT chest 07/01/2015 FINDINGS: Normal heart size mediastinal contours, and pulmonary vascularity. Patchy BILATERAL pulmonary infiltrates are again identified, especially RIGHT upper lobe, RIGHT base, and LEFT lower lobe. Findings most likely represent pneumonia. BILATERAL pleural effusions. No pneumothorax. Bones demineralized. IMPRESSION: Persistent BILATERAL pulmonary  infiltrates consistent with pneumonia. BILATERAL pleural effusions. Electronically Signed   By: Lavonia Dana M.D.   On: 07/03/2015 08:41   Dg Chest 2 View  06/30/2015  CLINICAL DATA:  Cough, shortness of breath EXAM: CHEST  2 VIEW COMPARISON:  12/04/2011 FINDINGS: Multifocal patchy opacities in the bilateral upper lobes (right greater than left) and bilateral lower lobes, suspicious for pneumonia. Suspected small right pleural effusion. No pneumothorax. The heart is normal in size. Mild degenerative changes of the visualized  thoracolumbar spine. IMPRESSION: Multifocal patchy opacities, suspicious for pneumonia. Suspected small right pleural effusion. Electronically Signed   By: Julian Hy M.D.   On: 06/30/2015 17:52   Ct Angio Chest Pe W/cm &/or Wo Cm  07/01/2015  CLINICAL DATA:  Increasing shortness of breath for 2 days. EXAM: CT ANGIOGRAPHY CHEST WITH CONTRAST TECHNIQUE: Multidetector CT imaging of the chest was performed using the standard protocol during bolus administration of intravenous contrast. Multiplanar CT image reconstructions and MIPs were obtained to evaluate the vascular anatomy. CONTRAST:  143mL OMNIPAQUE IOHEXOL 350 MG/ML SOLN COMPARISON:  Chest x-ray dated 06/30/2015 FINDINGS: There are no pulmonary emboli. Heart size is normal. There is slight mediastinal adenopathy. There is a 13 mm azygos lymph node with multiple small lymph nodes adjacent to the arch of the aorta. There is a 2.5 cm nodal mass in the subcarinal region. There are large bilateral pleural effusions with compressive atelectasis at both lung bases, left more than right. There are patchy bilateral upper lobe pulmonary infiltrates. I suspect the adenopathy is secondary to these infiltrates. There is hepatomegaly, nonspecific. The patient has subcutaneous edema in the upper abdomen and lower chest. Review of the MIP images confirms the above findings. IMPRESSION: 1. No pulmonary emboli. 2. Patchy bilateral upper lobe  infiltrates with probable reactive mediastinal adenopathy. 3. Large bilateral pleural effusions with compressive atelectasis of the lower lobes. 4. Nonspecific hepatomegaly and anasarca. Electronically Signed   By: Lorriane Shire M.D.   On: 07/01/2015 09:43   Dg Chest Port 1 View  07/08/2015  CLINICAL DATA:  Post right thoracentesis. EXAM: PORTABLE CHEST 1 VIEW COMPARISON:  07/06/2015 and earlier. FINDINGS: No evidence of pneumothorax after right thoracentesis. Near complete resolution of right pleural effusion. Improved aeration in the right lung base with only minimal linear atelectasis persisting. Stable moderate-sized left pleural effusion and associated dense consolidation in the left lower lobe. Interval resolution of interstitial pulmonary edema. IMPRESSION: 1. No pneumothorax after right thoracentesis. 2. Near complete resolution of the right pleural effusion with improved aeration in the right lower lobe. Only minimal linear atelectasis persists. 3. Stable moderate-sized left pleural effusion and associated dense passive atelectasis and/or pneumonia in the left lower lobe. 4. Interval resolution of interstitial pulmonary edema. Electronically Signed   By: Evangeline Dakin M.D.   On: 07/08/2015 17:23   Dg Chest Port 1 View  07/06/2015  CLINICAL DATA:  Productive cough and fever for the past 6 days. Pneumonia. EXAM: PORTABLE CHEST 1 VIEW COMPARISON:  07/03/2015. FINDINGS: Mild increase in patchy opacity at the right lung base with a mild increase in right pleural fluid. Mildly decreased right perihilar and left basilar opacity. Mild increase in prominence of the pulmonary vasculature and interstitial markings. Stable borderline cardiomegaly. No significant change in a small left pleural effusion. IMPRESSION: 1. Mildly increased right basilar pneumonia and right pleural fluid. 2. Improved right perihilar and left basilar atelectasis or pneumonia. 3. Stable small left pleural effusion. 4. Mild increase in  pulmonary vascular congestion and mild interstitial pulmonary edema. Electronically Signed   By: Claudie Revering M.D.   On: 07/06/2015 10:21   US Abdomen Limited Ruq  07/10/2015  CLINICAL DATA:  Hepatomegaly, possible cirrhosis EXAM: US ABDOMEN LIMITED - RIGHT UPPER QUADRANT COMPARISON:  CT scan of the chest 07/01/2015 FINDINGS: Gallbladder: No shadowing gallstones are noted within gallbladder. A gallbladder wall polyp measures 7 mm. No thickening of gallbladder wall. No sonographic Murphy's sign. Common bile duct: Diameter: 6.8 mm in diameter mild prominent in  size. Liver: No focal hepatic mass. There is heterogeneous mild increased echogenicity of the liver suspicious for fatty infiltration. No intrahepatic biliary ductal dilatation. IMPRESSION: 1. No shadowing gallstones are noted within gallbladder. There is 7 mm gallbladder wall polyp. No thickening of gallbladder wall. No sonographic Murphy's sign. Mild prominent size CBD measures 6.8 mm in diameter. 2. Diffuse mild increased echogenicity of the liver suspicious for mild fatty infiltration. No focal hepatic mass. No intrahepatic biliary ductal dilatation. Electronically Signed   By: Lahoma Crocker M.D.   On: 07/10/2015 10:28    Microbiology: Recent Results (from the past 240 hour(s))  Culture, sputum-assessment     Status: None   Collection Time: 07/03/15  1:57 PM  Result Value Ref Range Status   Specimen Description SPUTUM  Final   Special Requests NONE  Final   Sputum evaluation   Final    MICROSCOPIC FINDINGS SUGGEST THAT THIS SPECIMEN IS NOT REPRESENTATIVE OF LOWER RESPIRATORY SECRETIONS. PLEASE RECOLLECT. RESULTS CALLED TO, READ BACK BY AND VERIFIED WITH JESSICA DEUTSCH,RN [id number] @ [place]    Report Status 07/03/2015 FINAL  Final  Body fluid culture     Status: None (Preliminary result)   Collection Time: 07/08/15  5:06 PM  Result Value Ref Range Status   Specimen Description PLEURAL RIGHT  Final   Special Requests Normal  Final    Gram Stain   Final    RARE WBC PRESENT, PREDOMINANTLY MONONUCLEAR NO ORGANISMS SEEN Performed at [facility]    Culture PENDING  Incomplete   Report Status PENDING  Incomplete     Labs: Basic Metabolic Panel:  Recent Labs Lab 07/06/15 0536 07/07/15 0550 07/10/15 0504  NA 139 143 140  K 4.3 4.2 4.7  CL 102 101 100*  CO2 31 36* 34*  GLUCOSE 119* 111* 246*  BUN 21* 19 18  CREATININE 0.86 0.77 1.02  CALCIUM 8.2* 8.2* 8.3*   Liver Function Tests:  Recent Labs Lab 07/08/15 1950  PROT 6.2*   No results for input(s): LIPASE, AMYLASE in the last 168 hours. No results for input(s): AMMONIA in the last 168 hours. CBC:  Recent Labs Lab 07/06/15 0536 07/07/15 0550 07/10/15 0504  WBC 6.7 7.0 6.8  HGB 10.9* 10.7* 10.6*  HCT 35.8* 34.6* 34.8*  MCV 93.5 91.5 93.8  PLT 359 335 303   Cardiac Enzymes: No results for input(s): CKTOTAL, CKMB, CKMBINDEX, TROPONINI in the last 168 hours. BNP: BNP (last 3 results)  Recent Labs  06/30/15 2245  BNP 168.6*    ProBNP (last 3 results) No results for input(s): PROBNP in the last 8760 hours.  CBG:  Recent Labs Lab 07/09/15 1200 07/09/15 1713 07/09/15 2109 07/10/15 0522 07/10/15 0739  GLUCAP 128* 90 190* 212* 205*

## 2015-07-11 ENCOUNTER — Telehealth: Payer: Self-pay | Admitting: *Deleted

## 2015-07-11 LAB — BODY FLUID CULTURE
Culture: NO GROWTH
SPECIAL REQUESTS: NORMAL

## 2015-07-11 NOTE — Telephone Encounter (Signed)
1st attempt Transitional Care Management. Left message on machine for patient to return our call.

## 2015-07-12 ENCOUNTER — Telehealth: Payer: Self-pay | Admitting: *Deleted

## 2015-07-12 NOTE — Telephone Encounter (Signed)
Transition Care Management Follow-up Telephone Call  How have you been since you were released from the hospital? Doing okay   Do you understand why you were in the hospital? yes   Do you understand the discharge instrcutions? yes  Items Reviewed:  Medications reviewed: yes  Allergies reviewed: yes  Dietary changes reviewed: yes  Referrals reviewed: yes   Functional Questionnaire:   Activities of Daily Living (ADLs):   He states they are independent in the following: ambulation, bathing and hygiene, feeding, continence, grooming, toileting and dressing States they require assistance with the following: none   Any transportation issues/concerns?: yes   Any patient concerns? Yes - needs home health order - see phone note   Confirmed importance and date/time of follow-up visits scheduled: yes   Confirmed with patient if condition begins to worsen call PCP or go to the ER.  Patient was given the Call-a-Nurse line [phone]: yes  Patient was discharged 07/10/15 Patient was discharged to his home Patient has an appointment with Dr Sarajane Jews 07/17/15

## 2015-07-12 NOTE — Telephone Encounter (Signed)
Patient would like an order for home health to come to change his unno boots.  Please advise.

## 2015-07-12 NOTE — Telephone Encounter (Signed)
Dr. Sarajane Jews wrote script and I faxed to [facility] at [phone] and phone number is [phone], also spoke with pt and gave this information.

## 2015-07-14 ENCOUNTER — Emergency Department (HOSPITAL_COMMUNITY): Payer: Medicaid Other

## 2015-07-14 ENCOUNTER — Inpatient Hospital Stay (HOSPITAL_COMMUNITY)
Admission: EM | Admit: 2015-07-14 | Discharge: 2015-07-16 | DRG: 194 | Disposition: A | Discharge status: Home / Self Care | Payer: Medicaid Other | Attending: Internal Medicine | Admitting: Internal Medicine

## 2015-07-14 ENCOUNTER — Encounter (HOSPITAL_COMMUNITY): Payer: Self-pay | Admitting: Emergency Medicine

## 2015-07-14 DIAGNOSIS — E1122 Type 2 diabetes mellitus with diabetic chronic kidney disease: Secondary | ICD-10-CM | POA: Diagnosis present

## 2015-07-14 DIAGNOSIS — I129 Hypertensive chronic kidney disease with stage 1 through stage 4 chronic kidney disease, or unspecified chronic kidney disease: Secondary | ICD-10-CM | POA: Diagnosis present

## 2015-07-14 DIAGNOSIS — E1065 Type 1 diabetes mellitus with hyperglycemia: Secondary | ICD-10-CM

## 2015-07-14 DIAGNOSIS — E11319 Type 2 diabetes mellitus with unspecified diabetic retinopathy without macular edema: Secondary | ICD-10-CM | POA: Diagnosis present

## 2015-07-14 DIAGNOSIS — M79606 Pain in leg, unspecified: Secondary | ICD-10-CM

## 2015-07-14 DIAGNOSIS — I10 Essential (primary) hypertension: Secondary | ICD-10-CM | POA: Diagnosis present

## 2015-07-14 DIAGNOSIS — F329 Major depressive disorder, single episode, unspecified: Secondary | ICD-10-CM | POA: Diagnosis present

## 2015-07-14 DIAGNOSIS — A419 Sepsis, unspecified organism: Secondary | ICD-10-CM

## 2015-07-14 DIAGNOSIS — E1029 Type 1 diabetes mellitus with other diabetic kidney complication: Secondary | ICD-10-CM

## 2015-07-14 DIAGNOSIS — E1142 Type 2 diabetes mellitus with diabetic polyneuropathy: Secondary | ICD-10-CM | POA: Diagnosis present

## 2015-07-14 DIAGNOSIS — R059 Cough, unspecified: Secondary | ICD-10-CM

## 2015-07-14 DIAGNOSIS — J45909 Unspecified asthma, uncomplicated: Secondary | ICD-10-CM | POA: Diagnosis present

## 2015-07-14 DIAGNOSIS — Y95 Nosocomial condition: Secondary | ICD-10-CM | POA: Diagnosis present

## 2015-07-14 DIAGNOSIS — N5089 Other specified disorders of the male genital organs: Secondary | ICD-10-CM | POA: Diagnosis present

## 2015-07-14 DIAGNOSIS — N179 Acute kidney failure, unspecified: Secondary | ICD-10-CM | POA: Diagnosis present

## 2015-07-14 DIAGNOSIS — M79604 Pain in right leg: Secondary | ICD-10-CM | POA: Diagnosis present

## 2015-07-14 DIAGNOSIS — L03314 Cellulitis of groin: Secondary | ICD-10-CM | POA: Diagnosis present

## 2015-07-14 DIAGNOSIS — Z885 Allergy status to narcotic agent status: Secondary | ICD-10-CM

## 2015-07-14 DIAGNOSIS — J90 Pleural effusion, not elsewhere classified: Secondary | ICD-10-CM | POA: Diagnosis present

## 2015-07-14 DIAGNOSIS — N182 Chronic kidney disease, stage 2 (mild): Secondary | ICD-10-CM | POA: Diagnosis present

## 2015-07-14 DIAGNOSIS — Z7982 Long term (current) use of aspirin: Secondary | ICD-10-CM

## 2015-07-14 DIAGNOSIS — J189 Pneumonia, unspecified organism: Secondary | ICD-10-CM | POA: Diagnosis present

## 2015-07-14 DIAGNOSIS — M79659 Pain in unspecified thigh: Secondary | ICD-10-CM | POA: Diagnosis present

## 2015-07-14 DIAGNOSIS — F411 Generalized anxiety disorder: Secondary | ICD-10-CM | POA: Diagnosis present

## 2015-07-14 DIAGNOSIS — Z794 Long term (current) use of insulin: Secondary | ICD-10-CM

## 2015-07-14 DIAGNOSIS — E860 Dehydration: Secondary | ICD-10-CM | POA: Diagnosis present

## 2015-07-14 DIAGNOSIS — S81801A Unspecified open wound, right lower leg, initial encounter: Secondary | ICD-10-CM | POA: Diagnosis present

## 2015-07-14 DIAGNOSIS — Z23 Encounter for immunization: Secondary | ICD-10-CM

## 2015-07-14 DIAGNOSIS — Z833 Family history of diabetes mellitus: Secondary | ICD-10-CM

## 2015-07-14 DIAGNOSIS — N189 Chronic kidney disease, unspecified: Secondary | ICD-10-CM

## 2015-07-14 DIAGNOSIS — Z87891 Personal history of nicotine dependence: Secondary | ICD-10-CM

## 2015-07-14 DIAGNOSIS — R809 Proteinuria, unspecified: Secondary | ICD-10-CM

## 2015-07-14 DIAGNOSIS — IMO0002 Reserved for concepts with insufficient information to code with codable children: Secondary | ICD-10-CM

## 2015-07-14 DIAGNOSIS — Z809 Family history of malignant neoplasm, unspecified: Secondary | ICD-10-CM

## 2015-07-14 DIAGNOSIS — Z79899 Other long term (current) drug therapy: Secondary | ICD-10-CM

## 2015-07-14 DIAGNOSIS — R05 Cough: Secondary | ICD-10-CM

## 2015-07-14 DIAGNOSIS — E785 Hyperlipidemia, unspecified: Secondary | ICD-10-CM | POA: Diagnosis present

## 2015-07-14 DIAGNOSIS — S81802A Unspecified open wound, left lower leg, initial encounter: Secondary | ICD-10-CM | POA: Diagnosis present

## 2015-07-14 DIAGNOSIS — Z8249 Family history of ischemic heart disease and other diseases of the circulatory system: Secondary | ICD-10-CM

## 2015-07-14 DIAGNOSIS — Z9109 Other allergy status, other than to drugs and biological substances: Secondary | ICD-10-CM

## 2015-07-14 DIAGNOSIS — G4733 Obstructive sleep apnea (adult) (pediatric): Secondary | ICD-10-CM | POA: Diagnosis present

## 2015-07-14 DIAGNOSIS — Z881 Allergy status to other antibiotic agents status: Secondary | ICD-10-CM

## 2015-07-14 DIAGNOSIS — R509 Fever, unspecified: Secondary | ICD-10-CM

## 2015-07-14 HISTORY — DX: Chronic kidney disease, stage 2 (mild): N18.2

## 2015-07-14 LAB — CBC WITH DIFFERENTIAL/PLATELET
BASOS ABS: 0 10*3/uL (ref 0.0–0.1)
Basophils Relative: 0 %
EOS PCT: 0 %
Eosinophils Absolute: 0 10*3/uL (ref 0.0–0.7)
HEMATOCRIT: 35.6 % — AB (ref 39.0–52.0)
Hemoglobin: 11 g/dL — ABNORMAL LOW (ref 13.0–17.0)
LYMPHS ABS: 0.7 10*3/uL (ref 0.7–4.0)
LYMPHS PCT: 14 %
MCH: 28.5 pg (ref 26.0–34.0)
MCHC: 30.9 g/dL (ref 30.0–36.0)
MCV: 92.2 fL (ref 78.0–100.0)
MONO ABS: 0.4 10*3/uL (ref 0.1–1.0)
MONOS PCT: 9 %
Neutro Abs: 3.7 10*3/uL (ref 1.7–7.7)
Neutrophils Relative %: 77 %
PLATELETS: 249 10*3/uL (ref 150–400)
RBC: 3.86 MIL/uL — ABNORMAL LOW (ref 4.22–5.81)
RDW: 14.1 % (ref 11.5–15.5)
WBC: 4.8 10*3/uL (ref 4.0–10.5)

## 2015-07-14 LAB — BASIC METABOLIC PANEL
Anion gap: 8 (ref 5–15)
BUN: 21 mg/dL — AB (ref 6–20)
CALCIUM: 8.6 mg/dL — AB (ref 8.9–10.3)
CO2: 30 mmol/L (ref 22–32)
CREATININE: 1.22 mg/dL (ref 0.61–1.24)
Chloride: 100 mmol/L — ABNORMAL LOW (ref 101–111)
GFR calc Af Amer: >60 mL/min (ref >60)
GLUCOSE: 151 mg/dL — AB (ref 65–99)
POTASSIUM: 4.9 mmol/L (ref 3.5–5.1)
Sodium: 138 mmol/L (ref 135–145)

## 2015-07-14 LAB — URINALYSIS, ROUTINE W REFLEX MICROSCOPIC
GLUCOSE, UA: NEGATIVE mg/dL
KETONES UR: NEGATIVE mg/dL
Leukocytes, UA: NEGATIVE
NITRITE: NEGATIVE
PH: 6 (ref 5.0–8.0)
Protein, ur: >300 mg/dL — AB
SPECIFIC GRAVITY, URINE: 1.024 (ref 1.005–1.030)

## 2015-07-14 LAB — URINE MICROSCOPIC-ADD ON

## 2015-07-14 LAB — I-STAT CG4 LACTIC ACID, ED: Lactic Acid, Venous: 0.83 mmol/L (ref 0.5–2.0)

## 2015-07-14 MED ORDER — SODIUM CHLORIDE 0.9 % IV BOLUS (SEPSIS): 1000.0000 mL | Freq: Once | INTRAVENOUS | Status: DC

## 2015-07-14 MED ORDER — ACETAMINOPHEN 325 MG PO TABS
650.0000 mg | ORAL_TABLET | Freq: Once | ORAL | Status: AC | PRN
  Administered 2015-07-14: 650 mg via ORAL
  Filled 2015-07-14: qty 2

## 2015-07-14 NOTE — ED Provider Notes (Signed)
CSN: [id number]     Arrival date & time 07/14/15  1913 History   First MD Initiated Contact with Patient 07/14/15 2201     Chief Complaint  Patient presents with  . Cough  . Nausea  . Leg Pain     (Consider location/radiation/quality/duration/timing/severity/associated sxs/prior Treatment) The history is provided by the patient, medical records and the spouse.     Patient with hx DM, CKD, recent ICU admission for respiratory failure with hypoxia, HCAP, bilateral pleural effusions, cellulitis of the groin and lower extremities.  He was d/c 4 days ago and was doing well until today when he became lethargic, had decreased appetite, coughing, chills.  Temperature was 99.5 at home, was given tylenol, then came to ED where temperature was 102.5.   Pt has had cellulitis of scrotum that has improved dramatically, has taken 4/7 days of doxycycline.   Was admitted in December 2016 for severe sepsis due to the cellulitis and February 2017 to ICU for hypoxia, HCAP, effusions, d/c 4 days ago.   Past Medical History  Diagnosis Date  . Hypertension   . Anxiety   . Shingles 2010  . Allergic rhinitis   . Diabetes mellitus     sees Dr. Jacelyn Pi   . Retinopathy of both eyes     sees Dr. Zadie Rhine   . Neuropathy in diabetes ([place])   . Cellulitis     legs - hx  . OSA (obstructive sleep apnea)     CPAP- not wearing  . Recurrent upper respiratory infection (URI)     frequent bronchcitis  . H/O hiatal hernia     pt thinks so  . Headache(784.0)     had migraine- not in years  . Retinopathy     right eye  . Asthma   . Chronic kidney disease   . Bladder infection     in June -Was on Cipro  . Heart murmur     Followed by Dr Stanford Breed  . Hyperlipidemia    Past Surgical History  Procedure Laterality Date  . Tonsillectomy  1983  . Eye surgery  2012    Cataract with lens  Right  . 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 Pars plana vitrectomy  12/04/2011    Procedure: PARS PLANA VITRECTOMY WITH 25 GAUGE;  Surgeon: Hurman Horn, MD;  Location: [facility];  Service: Ophthalmology;  Laterality: Left;  Insertion Silicon Oil [id number] CS and    . Vitrectomy  2013    per Dr. Zadie Rhine   Family History  Problem Relation Age of Onset  . Diabetes Mother   . Transient ischemic attack Father   . Cancer Father   . Anesthesia problems Neg Hx   . Diabetes Maternal Uncle   . Diabetes Maternal Grandmother   . Heart disease Maternal Grandfather    Social History  Substance Use Topics  . Smoking status: Former Smoker -- 0.30 packs/day for 29 years    Quit date: 08/16/2009  . Smokeless tobacco: Former Systems developer     Comment: started at age 1.  smoked to 19 or 6 cigs a day.   . Alcohol Use: No    Review of Systems  All other systems reviewed and are negative.  Allergies  Codeine; Erythromycin; Hibiclens; Other; and Soap  Home Medications   Prior to Admission medications   Medication Sig Start Date End Date Taking? Authorizing Provider  albuterol (PROVENTIL HFA;VENTOLIN HFA) 108 (90 Base) MCG/ACT inhaler Inhale 2 puffs into the lungs every 4 (four) hours as needed for wheezing or shortness of breath. PRN:  Allergies/breathing 07/10/15  Yes Robbie Lis, MD  Alcohol Swabs PADS Use up to 10 times per day for testing and insulin injections, diagnosis code is 250.00 02/01/13  Yes Laurey Morale, MD  amLODipine (NORVASC) 10 MG tablet Take 1 tablet (10 mg total) by mouth daily. 05/09/15  Yes Donne Hazel, MD  aspirin 325 MG tablet Take 325 mg by mouth daily. Reported on 05/29/2015   Yes Historical Provider, MD  cloNIDine (CATAPRES) 0.1 MG tablet Take 1 tablet (0.1 mg total) by mouth 2 (two) times daily. 06/13/15  Yes Laurey Morale, MD  diphenhydrAMINE  (BENADRYL) 25 MG tablet Take 25 mg by mouth every 6 (six) hours as needed for allergies.    Yes Historical Provider, MD  doxycycline (VIBRA-TABS) 100 MG tablet Take 1 tablet (100 mg total) by mouth every 12 (twelve) hours. 07/10/15  Yes Robbie Lis, MD  FLUoxetine (PROZAC) 40 MG capsule Take 1 capsule (40 mg total) by mouth daily. Patient taking differently: Take 40 mg by mouth 2 (two) times daily.  05/29/14  Yes Laurey Morale, MD  furosemide (LASIX) 40 MG tablet Take 1 tablet (40 mg total) by mouth 2 (two) times daily. 07/10/15  Yes Robbie Lis, MD  gabapentin (NEURONTIN) 300 MG capsule Take 1 capsule (300 mg total) by mouth 3 (three) times daily. 07/10/15  Yes Robbie Lis, MD  guaiFENesin (MUCINEX) 600 MG 12 hr tablet Take 600 mg by mouth 2 (two) times daily.   Yes Historical Provider, MD  insulin NPH Human (HUMULIN N) 100 UNIT/ML injection Inject 0.25 mLs (25 Units total) into the skin 2 (two) times daily before a meal. Patient taking differently: Inject 35 Units into the skin 2 (two) times daily before a meal.  05/09/15  Yes Donne Hazel, MD  insulin regular (HUMULIN R) 100 units/mL injection Inject 0.04 mLs (4 Units total) into the skin 3 (three) times daily before meals. Patient taking differently: Inject 4-8 Units into the skin 2 (two) times daily. 8 units in the morning, 4 in the evening 05/09/15  Yes Donne Hazel, MD  metoprolol succinate (TOPROL-XL) 50 MG 24 hr tablet Take 1 tablet (50 mg total) by mouth daily. Take with or immediately following a meal. Patient taking differently: Take 50 mg by mouth 2 (two) times daily. Take with or immediately following a meal. 05/21/15  Yes Laurey Morale, MD  Potassium Chloride ER 20 MEQ TBCR Take 20 mEq by mouth daily. 07/10/15  Yes Robbie Lis, MD   BP 122/61 mmHg  Pulse 75  Temp(Src) 102.5 F (39.2 C) (Oral)  Resp 31  SpO2 94% Physical Exam  Constitutional: He appears well-developed and well-nourished. No distress.  HENT:  Head:  Normocephalic and atraumatic.  Neck: Neck supple.  Cardiovascular: Normal rate and regular rhythm.   Pulmonary/Chest: Effort normal and breath sounds normal. No respiratory distress. He has no wheezes. He has no rales.  cough  Abdominal: Soft. He exhibits no distension and no mass. There is no tenderness. There is no rebound and no guarding.  Musculoskeletal: He exhibits edema.  Neurological: He is alert. He exhibits normal muscle  tone.  Skin: He is not diaphoretic.  Unna boots on bilateral legs, pt prefers not to remove.   Nursing note and vitals reviewed.   ED Course  Procedures (including critical care time) Labs Review Labs Reviewed  BASIC METABOLIC PANEL - Abnormal; Notable for the following:    Chloride 100 (*)    Glucose, Bld 151 (*)    BUN 21 (*)    Calcium 8.6 (*)    All other components within normal limits  CBC WITH DIFFERENTIAL/PLATELET - Abnormal; Notable for the following:    RBC 3.86 (*)    Hemoglobin 11.0 (*)    HCT 35.6 (*)    All other components within normal limits  URINALYSIS, ROUTINE W REFLEX MICROSCOPIC (NOT AT [facility]) - Abnormal; Notable for the following:    Color, Urine AMBER (*)    Hgb urine dipstick MODERATE (*)    Bilirubin Urine SMALL (*)    Protein, ur >300 (*)    All other components within normal limits  URINE MICROSCOPIC-ADD ON - Abnormal; Notable for the following:    Squamous Epithelial / LPF 0-5 (*)    Bacteria, UA FEW (*)    Casts HYALINE CASTS (*)    All other components within normal limits  CULTURE, BLOOD (ROUTINE X 2)  CULTURE, BLOOD (ROUTINE X 2)  I-STAT CG4 LACTIC ACID, ED    Imaging Review Dg Chest 2 View  07/14/2015  CLINICAL DATA:  Acute onset of cough and fever. Nausea and vomiting. Loss of appetite. Initial encounter. EXAM: CHEST  2 VIEW COMPARISON:  Chest radiograph performed 07/08/2015 FINDINGS: The lungs are well-aerated. Vascular congestion is noted. Small bilateral pleural effusions are noted. Mild left basilar  atelectasis is seen. There is no evidence of pneumothorax. The heart is normal in size; the mediastinal contour is within normal limits. No acute osseous abnormalities are seen. IMPRESSION: Small bilateral pleural effusions noted. Vascular congestion noted. Mild left basilar atelectasis seen. Electronically Signed   By: Garald Balding M.D.   On: 07/14/2015 23:46   I have personally reviewed and evaluated these images and lab results as part of my medical decision-making.   EKG Interpretation None       12:19 AM Discussed pt, results, and plan with Dr Kathrynn Humble who recommends blood cultures, vanc/zosyn, will also see the patient.    MDM   Final diagnoses:  Sepsis, due to unspecified organism ([place])  Cough   Febrile patient with complex recent medical hx, HCAP, cellulitis of the groin, with sudden worsening today with fatigue, fever, cough, anorexia.  Pt requested not to have IVF initially due to recent admissions adding "70 pounds" of fluid that he is still trying to shed.  He also requested that I not remove his lower extremity dressings.  Given the more likely source of his symptoms I deferred in these two areas.  Labs and CXR reassuring.  Flu swab pending.  Given recent hospitalizations and complex history, SIRS criteria med with fever, hypoxia, will admit.  Discussed pt with Dr Blaine Hamper who accepts admission for [facility].      Clayton Bibles, PA-C 07/15/15 [place], PA-C 07/18/15 1444  Varney Biles, MD 07/20/15 1456

## 2015-07-14 NOTE — ED Notes (Signed)
Patient recently admitted for double pneumonia, c/o cough, fever, N/V, cellulitis.

## 2015-07-15 ENCOUNTER — Encounter (HOSPITAL_COMMUNITY): Payer: Self-pay | Admitting: Internal Medicine

## 2015-07-15 ENCOUNTER — Inpatient Hospital Stay (HOSPITAL_COMMUNITY): Payer: Medicaid Other

## 2015-07-15 DIAGNOSIS — E1142 Type 2 diabetes mellitus with diabetic polyneuropathy: Secondary | ICD-10-CM | POA: Diagnosis present

## 2015-07-15 DIAGNOSIS — J90 Pleural effusion, not elsewhere classified: Secondary | ICD-10-CM

## 2015-07-15 DIAGNOSIS — S81801A Unspecified open wound, right lower leg, initial encounter: Secondary | ICD-10-CM | POA: Diagnosis present

## 2015-07-15 DIAGNOSIS — J45909 Unspecified asthma, uncomplicated: Secondary | ICD-10-CM | POA: Diagnosis present

## 2015-07-15 DIAGNOSIS — F329 Major depressive disorder, single episode, unspecified: Secondary | ICD-10-CM | POA: Diagnosis present

## 2015-07-15 DIAGNOSIS — Y95 Nosocomial condition: Secondary | ICD-10-CM | POA: Diagnosis present

## 2015-07-15 DIAGNOSIS — N5089 Other specified disorders of the male genital organs: Secondary | ICD-10-CM | POA: Diagnosis present

## 2015-07-15 DIAGNOSIS — A419 Sepsis, unspecified organism: Secondary | ICD-10-CM

## 2015-07-15 DIAGNOSIS — Z87891 Personal history of nicotine dependence: Secondary | ICD-10-CM | POA: Diagnosis not present

## 2015-07-15 DIAGNOSIS — Z885 Allergy status to narcotic agent status: Secondary | ICD-10-CM | POA: Diagnosis not present

## 2015-07-15 DIAGNOSIS — Z8249 Family history of ischemic heart disease and other diseases of the circulatory system: Secondary | ICD-10-CM | POA: Diagnosis not present

## 2015-07-15 DIAGNOSIS — M79659 Pain in unspecified thigh: Secondary | ICD-10-CM | POA: Diagnosis present

## 2015-07-15 DIAGNOSIS — E1122 Type 2 diabetes mellitus with diabetic chronic kidney disease: Secondary | ICD-10-CM | POA: Diagnosis present

## 2015-07-15 DIAGNOSIS — Z833 Family history of diabetes mellitus: Secondary | ICD-10-CM | POA: Diagnosis not present

## 2015-07-15 DIAGNOSIS — R509 Fever, unspecified: Secondary | ICD-10-CM

## 2015-07-15 DIAGNOSIS — I129 Hypertensive chronic kidney disease with stage 1 through stage 4 chronic kidney disease, or unspecified chronic kidney disease: Secondary | ICD-10-CM | POA: Diagnosis present

## 2015-07-15 DIAGNOSIS — M79606 Pain in leg, unspecified: Secondary | ICD-10-CM

## 2015-07-15 DIAGNOSIS — M79604 Pain in right leg: Secondary | ICD-10-CM | POA: Diagnosis present

## 2015-07-15 DIAGNOSIS — Z794 Long term (current) use of insulin: Secondary | ICD-10-CM | POA: Diagnosis not present

## 2015-07-15 DIAGNOSIS — Z7982 Long term (current) use of aspirin: Secondary | ICD-10-CM | POA: Diagnosis not present

## 2015-07-15 DIAGNOSIS — R05 Cough: Secondary | ICD-10-CM | POA: Diagnosis present

## 2015-07-15 DIAGNOSIS — L03314 Cellulitis of groin: Secondary | ICD-10-CM | POA: Diagnosis present

## 2015-07-15 DIAGNOSIS — I10 Essential (primary) hypertension: Secondary | ICD-10-CM

## 2015-07-15 DIAGNOSIS — Z79899 Other long term (current) drug therapy: Secondary | ICD-10-CM | POA: Diagnosis not present

## 2015-07-15 DIAGNOSIS — E785 Hyperlipidemia, unspecified: Secondary | ICD-10-CM | POA: Diagnosis present

## 2015-07-15 DIAGNOSIS — G4733 Obstructive sleep apnea (adult) (pediatric): Secondary | ICD-10-CM | POA: Diagnosis present

## 2015-07-15 DIAGNOSIS — N179 Acute kidney failure, unspecified: Secondary | ICD-10-CM | POA: Diagnosis present

## 2015-07-15 DIAGNOSIS — E860 Dehydration: Secondary | ICD-10-CM | POA: Diagnosis present

## 2015-07-15 DIAGNOSIS — S81801D Unspecified open wound, right lower leg, subsequent encounter: Secondary | ICD-10-CM

## 2015-07-15 DIAGNOSIS — Z809 Family history of malignant neoplasm, unspecified: Secondary | ICD-10-CM | POA: Diagnosis not present

## 2015-07-15 DIAGNOSIS — Z9109 Other allergy status, other than to drugs and biological substances: Secondary | ICD-10-CM | POA: Diagnosis not present

## 2015-07-15 DIAGNOSIS — R059 Cough, unspecified: Secondary | ICD-10-CM | POA: Diagnosis present

## 2015-07-15 DIAGNOSIS — Z23 Encounter for immunization: Secondary | ICD-10-CM | POA: Diagnosis not present

## 2015-07-15 DIAGNOSIS — S81802A Unspecified open wound, left lower leg, initial encounter: Secondary | ICD-10-CM | POA: Diagnosis present

## 2015-07-15 DIAGNOSIS — N182 Chronic kidney disease, stage 2 (mild): Secondary | ICD-10-CM | POA: Diagnosis present

## 2015-07-15 DIAGNOSIS — M79651 Pain in right thigh: Secondary | ICD-10-CM

## 2015-07-15 DIAGNOSIS — N189 Chronic kidney disease, unspecified: Secondary | ICD-10-CM

## 2015-07-15 DIAGNOSIS — J189 Pneumonia, unspecified organism: Secondary | ICD-10-CM | POA: Diagnosis present

## 2015-07-15 DIAGNOSIS — F411 Generalized anxiety disorder: Secondary | ICD-10-CM | POA: Diagnosis present

## 2015-07-15 DIAGNOSIS — E11319 Type 2 diabetes mellitus with unspecified diabetic retinopathy without macular edema: Secondary | ICD-10-CM | POA: Diagnosis present

## 2015-07-15 DIAGNOSIS — Z881 Allergy status to other antibiotic agents status: Secondary | ICD-10-CM | POA: Diagnosis not present

## 2015-07-15 LAB — PROTIME-INR
INR: 1.35 (ref 0.00–1.49)
Prothrombin Time: 16.3 seconds — ABNORMAL HIGH (ref 11.6–15.2)

## 2015-07-15 LAB — BRAIN NATRIURETIC PEPTIDE: B Natriuretic Peptide: 201 pg/mL — ABNORMAL HIGH (ref 0.0–100.0)

## 2015-07-15 LAB — INFLUENZA PANEL BY PCR (TYPE A & B)
H1N1FLUPCR: NOT DETECTED
INFLBPCR: NEGATIVE
Influenza A By PCR: NEGATIVE

## 2015-07-15 LAB — CBC
HCT: 31.9 % — ABNORMAL LOW (ref 39.0–52.0)
HEMOGLOBIN: 9.8 g/dL — AB (ref 13.0–17.0)
MCH: 28.4 pg (ref 26.0–34.0)
MCHC: 30.7 g/dL (ref 30.0–36.0)
MCV: 92.5 fL (ref 78.0–100.0)
PLATELETS: 207 10*3/uL (ref 150–400)
RBC: 3.45 MIL/uL — ABNORMAL LOW (ref 4.22–5.81)
RDW: 14.3 % (ref 11.5–15.5)
WBC: 4.2 10*3/uL (ref 4.0–10.5)

## 2015-07-15 LAB — COMPREHENSIVE METABOLIC PANEL
ALT: 27 U/L (ref 17–63)
AST: 37 U/L (ref 15–41)
Albumin: 2.5 g/dL — ABNORMAL LOW (ref 3.5–5.0)
Alkaline Phosphatase: 172 U/L — ABNORMAL HIGH (ref 38–126)
Anion gap: 7 (ref 5–15)
BUN: 18 mg/dL (ref 6–20)
CO2: 28 mmol/L (ref 22–32)
Calcium: 8 mg/dL — ABNORMAL LOW (ref 8.9–10.3)
Chloride: 102 mmol/L (ref 101–111)
Creatinine, Ser: 1.36 mg/dL — ABNORMAL HIGH (ref 0.61–1.24)
GFR calc Af Amer: >60 mL/min (ref >60)
GFR calc non Af Amer: >60 mL/min (ref >60)
Glucose, Bld: 193 mg/dL — ABNORMAL HIGH (ref 65–99)
Potassium: 4.8 mmol/L (ref 3.5–5.1)
Sodium: 137 mmol/L (ref 135–145)
Total Bilirubin: 1.1 mg/dL (ref 0.3–1.2)
Total Protein: 5.9 g/dL — ABNORMAL LOW (ref 6.5–8.1)

## 2015-07-15 LAB — GLUCOSE, CAPILLARY
Glucose-Capillary: 207 mg/dL — ABNORMAL HIGH (ref 65–99)
Glucose-Capillary: 126 mg/dL — ABNORMAL HIGH (ref 65–99)
Glucose-Capillary: 131 mg/dL — ABNORMAL HIGH (ref 65–99)
Glucose-Capillary: 159 mg/dL — ABNORMAL HIGH (ref 65–99)

## 2015-07-15 LAB — APTT: aPTT: 23 seconds — ABNORMAL LOW (ref 24–37)

## 2015-07-15 LAB — PROCALCITONIN: Procalcitonin: 0.14 ng/mL

## 2015-07-15 LAB — CREATININE, URINE, RANDOM: CREATININE, URINE: 142.21 mg/dL

## 2015-07-15 MED ORDER — PIPERACILLIN-TAZOBACTAM 3.375 G IVPB
3.3750 g | Freq: Three times a day (TID) | INTRAVENOUS | Status: DC
  Administered 2015-07-15 – 2015-07-16 (×4): 3.375 g via INTRAVENOUS
  Filled 2015-07-15 (×6): qty 50

## 2015-07-15 MED ORDER — VANCOMYCIN HCL IN DEXTROSE 1-5 GM/200ML-% IV SOLN: 1000.0000 mg | INTRAVENOUS | Status: DC

## 2015-07-15 MED ORDER — HYDRALAZINE HCL 20 MG/ML IJ SOLN: 5.0000 mg | INTRAMUSCULAR | Status: DC | PRN

## 2015-07-15 MED ORDER — ALBUTEROL SULFATE (2.5 MG/3ML) 0.083% IN NEBU: 5.0000 mg | INHALATION_SOLUTION | RESPIRATORY_TRACT | Status: DC | PRN

## 2015-07-15 MED ORDER — ASPIRIN 325 MG PO TABS
325.0000 mg | ORAL_TABLET | Freq: Every day | ORAL | Status: DC
  Administered 2015-07-15 – 2015-07-16 (×2): 325 mg via ORAL
  Filled 2015-07-15 (×2): qty 1

## 2015-07-15 MED ORDER — GUAIFENESIN ER 600 MG PO TB12
600.0000 mg | ORAL_TABLET | Freq: Two times a day (BID) | ORAL | Status: DC
  Administered 2015-07-15 – 2015-07-16 (×4): 600 mg via ORAL
  Filled 2015-07-15 (×5): qty 1

## 2015-07-15 MED ORDER — SODIUM CHLORIDE 0.9 % IV BOLUS (SEPSIS)
1000.0000 mL | Freq: Once | INTRAVENOUS | Status: AC
  Administered 2015-07-15: 1000 mL via INTRAVENOUS

## 2015-07-15 MED ORDER — PIPERACILLIN-TAZOBACTAM 3.375 G IVPB 30 MIN: 3.3750 g | Freq: Once | INTRAVENOUS | Status: DC

## 2015-07-15 MED ORDER — VANCOMYCIN HCL IN DEXTROSE 1-5 GM/200ML-% IV SOLN: 1000.0000 mg | Freq: Two times a day (BID) | INTRAVENOUS | Status: DC

## 2015-07-15 MED ORDER — VANCOMYCIN HCL IN DEXTROSE 1-5 GM/200ML-% IV SOLN: 1000.0000 mg | Freq: Once | INTRAVENOUS | Status: DC

## 2015-07-15 MED ORDER — ALBUTEROL SULFATE (2.5 MG/3ML) 0.083% IN NEBU: 2.5000 mg | INHALATION_SOLUTION | Freq: Four times a day (QID) | RESPIRATORY_TRACT | Status: DC | PRN

## 2015-07-15 MED ORDER — LINEZOLID 600 MG/300ML IV SOLN
600.0000 mg | Freq: Two times a day (BID) | INTRAVENOUS | Status: DC
  Administered 2015-07-15 – 2015-07-16 (×4): 600 mg via INTRAVENOUS
  Filled 2015-07-15 (×4): qty 300

## 2015-07-15 MED ORDER — PIPERACILLIN-TAZOBACTAM 3.375 G IVPB 30 MIN
3.3750 g | Freq: Once | INTRAVENOUS | Status: AC
  Administered 2015-07-15: 3.375 g via INTRAVENOUS
  Filled 2015-07-15: qty 50

## 2015-07-15 MED ORDER — ENOXAPARIN SODIUM 60 MG/0.6ML ~~LOC~~ SOLN
60.0000 mg | SUBCUTANEOUS | Status: DC
  Administered 2015-07-15 – 2015-07-16 (×2): 60 mg via SUBCUTANEOUS
  Filled 2015-07-15 (×2): qty 0.6

## 2015-07-15 MED ORDER — FLUOXETINE HCL 20 MG PO CAPS
40.0000 mg | ORAL_CAPSULE | Freq: Two times a day (BID) | ORAL | Status: DC
  Administered 2015-07-15 – 2015-07-16 (×4): 40 mg via ORAL
  Filled 2015-07-15 (×7): qty 2

## 2015-07-15 MED ORDER — ACETAMINOPHEN 325 MG PO TABS
650.0000 mg | ORAL_TABLET | Freq: Four times a day (QID) | ORAL | Status: DC | PRN
  Administered 2015-07-15: 650 mg via ORAL
  Filled 2015-07-15: qty 2

## 2015-07-15 MED ORDER — METOPROLOL SUCCINATE ER 50 MG PO TB24
50.0000 mg | ORAL_TABLET | Freq: Two times a day (BID) | ORAL | Status: DC
  Administered 2015-07-15 – 2015-07-16 (×4): 50 mg via ORAL
  Filled 2015-07-15 (×5): qty 1

## 2015-07-15 MED ORDER — PNEUMOCOCCAL VAC POLYVALENT 25 MCG/0.5ML IJ INJ
0.5000 mL | INJECTION | INTRAMUSCULAR | Status: AC
Start: 2015-07-16 — End: 2015-07-16
  Administered 2015-07-16: 0.5 mL via INTRAMUSCULAR
  Filled 2015-07-15 (×2): qty 0.5

## 2015-07-15 MED ORDER — ACETAMINOPHEN 650 MG RE SUPP: 650.0000 mg | Freq: Four times a day (QID) | RECTAL | Status: DC | PRN

## 2015-07-15 MED ORDER — IPRATROPIUM BROMIDE 0.02 % IN SOLN
0.5000 mg | Freq: Four times a day (QID) | RESPIRATORY_TRACT | Status: DC
Start: 2015-07-15 — End: 2015-07-15

## 2015-07-15 MED ORDER — AMLODIPINE BESYLATE 10 MG PO TABS
10.0000 mg | ORAL_TABLET | Freq: Every day | ORAL | Status: DC
  Administered 2015-07-15 – 2015-07-16 (×2): 10 mg via ORAL
  Filled 2015-07-15 (×2): qty 1

## 2015-07-15 MED ORDER — IPRATROPIUM BROMIDE 0.02 % IN SOLN: 0.5000 mg | Freq: Four times a day (QID) | RESPIRATORY_TRACT | Status: DC

## 2015-07-15 MED ORDER — IPRATROPIUM-ALBUTEROL 0.5-2.5 (3) MG/3ML IN SOLN
3.0000 mL | Freq: Three times a day (TID) | RESPIRATORY_TRACT | Status: DC
  Administered 2015-07-15 – 2015-07-16 (×5): 3 mL via RESPIRATORY_TRACT
  Filled 2015-07-15 (×5): qty 3

## 2015-07-15 MED ORDER — INSULIN NPH (HUMAN) (ISOPHANE) 100 UNIT/ML ~~LOC~~ SUSP
25.0000 [IU] | Freq: Two times a day (BID) | SUBCUTANEOUS | Status: DC
  Administered 2015-07-15 – 2015-07-16 (×3): 25 [IU] via SUBCUTANEOUS
  Filled 2015-07-15: qty 10

## 2015-07-15 MED ORDER — SODIUM CHLORIDE 0.9 % IV SOLN
INTRAVENOUS | Status: DC
  Administered 2015-07-15: 05:00:00 via INTRAVENOUS

## 2015-07-15 MED ORDER — CLONIDINE HCL 0.1 MG PO TABS
0.1000 mg | ORAL_TABLET | Freq: Two times a day (BID) | ORAL | Status: DC
  Administered 2015-07-15 – 2015-07-16 (×4): 0.1 mg via ORAL
  Filled 2015-07-15 (×5): qty 1

## 2015-07-15 MED ORDER — INSULIN ASPART 100 UNIT/ML ~~LOC~~ SOLN
0.0000 [IU] | Freq: Three times a day (TID) | SUBCUTANEOUS | Status: DC
  Administered 2015-07-15: 3 [IU] via SUBCUTANEOUS
  Administered 2015-07-15 – 2015-07-16 (×3): 1 [IU] via SUBCUTANEOUS

## 2015-07-15 MED ORDER — DIPHENHYDRAMINE HCL 25 MG PO CAPS: 25.0000 mg | ORAL_CAPSULE | Freq: Four times a day (QID) | ORAL | Status: DC | PRN

## 2015-07-15 MED ORDER — ONDANSETRON HCL 4 MG/2ML IJ SOLN: 4.0000 mg | Freq: Three times a day (TID) | INTRAMUSCULAR | Status: DC | PRN

## 2015-07-15 MED ORDER — SODIUM CHLORIDE 0.9 % IV SOLN
1500.0000 mg | Freq: Once | INTRAVENOUS | Status: DC
  Filled 2015-07-15: qty 1500

## 2015-07-15 MED ORDER — OXYCODONE-ACETAMINOPHEN 5-325 MG PO TABS: 2.0000 | ORAL_TABLET | Freq: Four times a day (QID) | ORAL | Status: DC | PRN

## 2015-07-15 MED ORDER — GABAPENTIN 300 MG PO CAPS
300.0000 mg | ORAL_CAPSULE | Freq: Three times a day (TID) | ORAL | Status: DC
  Administered 2015-07-15 – 2015-07-16 (×5): 300 mg via ORAL
  Filled 2015-07-15 (×6): qty 1

## 2015-07-15 MED ORDER — SODIUM CHLORIDE 0.9% FLUSH
3.0000 mL | Freq: Two times a day (BID) | INTRAVENOUS | Status: DC
  Administered 2015-07-15: 3 mL via INTRAVENOUS

## 2015-07-15 NOTE — Progress Notes (Signed)
[facility]  This patient was admitted by my colleague earlier this AM. I have examined him and discussed the plan of care.  Alex Ryan is a 45 y.o. male with PMH of hypertension, hyperlipidemia, diabetes mellitus, asthma, depression, anxiety, chronic kidney disease-stage II, OSA on CPAP, recently treated for HCAP, pleural effusion, scrotal cellulitis and right leg wound, who presents with cough and fever. He states that he has continued to take his Doxycycline and has about 3 days left. His cellulitis has not recurred. He has a dry cough which is actually improved from his last admission.   Principal Problem: Fever - 102 degrees- CXR neg for infiltrates- cough is improving - Influenza negative- RSV panel pending - UA negative - has sinus drainage and may have a viral URI - groin/ scrotal area also improving - no rash or ulcers noted- has an UNNA boot in left leg and tell me that there was no ulcer or rash when this was placed and it was essentially place for chronic leg swelling - f/u blood cultures - was completing a course of Doxycycline- continue Zyvox and Zosyn for now  Right leg pain? - no pain today- ultrasound negative of rDVT  Acute on chronic renal failure - holding diuretics - no h/o CHF  Pleural effusion - S/p of right thoracentesis on 2/20. Chest x-ray showed small bilateral effusion.  -observe for nownow  DM-II - Last A1c 12.3 on 05/06/15, poorly controled. Patient is taking NPH insulin at home - decreased NPH insulin dose from 35 to 25 units - cont Novolog   Debbe Odea, MD

## 2015-07-15 NOTE — Progress Notes (Signed)
Patient declines the use of nocturnal CPAP supplied by the hospital due to inability to tolerate the mask. He admits to having nasal pillows at home, which the family member has agreed to bring in later today. He wishes to wait on the use of CPAP until his home supplies arrive. VSS at this time. NAD noted, and he currently denies the need for BD. He wishes to wait until later this morning to begin his BD regimen. RT treatment protocol assessment completed. Orders changed according to accuity score. RT will continue to follow.

## 2015-07-15 NOTE — Progress Notes (Signed)
Pharmacy Antibiotic Note  Alex Ryan is a 45 y.o. male admitted on 07/14/2015 with sepsis & cellulitis of groin.  Pharmacy has been consulted for Vancomycin & Zosyn dosing.  Patient recently hospitalized for respiratory failure, HCAP, groin cellutlis (discharged 4 days ago).  Presents with cough, fever, fatigue, nausea and leg pain.  Plan:  Vancomycin 2500mg  IV x 1 loading dose (given as a 1gm dose immediately followed by 1500mg  dose), then continue with Vancomycin 1gm IV q12h  Zosyn 3.375gm IV x 1 given over 30 min in the ED followed by 3.375gm IV q8h (each dose infused over 4 hrs)  F/U cultures/sensitivities  Follow renal function     Temp (24hrs), Avg:101.5 F (38.6 C), Min:100.5 F (38.1 C), Max:102.5 F (39.2 C)   Recent Labs Lab 07/10/15 0504 07/14/15 2303 07/14/15 2312  WBC 6.8 4.8  --   CREATININE 1.02 1.22  --   LATICACIDVEN  --   --  0.83    Estimated Creatinine Clearance: 102.5 mL/min (by C-G formula based on Cr of 1.22).    Allergies  Allergen Reactions  . Codeine Hives and Nausea And Vomiting    Hyper  . Erythromycin Nausea And Vomiting  . Hibiclens [Chlorhexidine] Itching  . Other     Pre-op cleaning wipes:. itching  . Soap Itching    "dial soap"    Antimicrobials this admission: 2/27 Vanc >>   2/27 Zosyn >>    Goal: Vancomycin Trough = 15-20 mcg/ml  Dose adjustments this admission:    Microbiology results: 2/27 BCx:   2/27 Sputum:    2/27 Resp Virus Panel:  Thank you for allowing pharmacy to be a part of this patient's care.  Everette Rank, PharmD 07/15/2015 2:00 AM

## 2015-07-15 NOTE — Progress Notes (Addendum)
*  PRELIMINARY RESULTS* Vascular Ultrasound Right lower extremity venous duplex has been completed.  Preliminary findings: No evidence of DVT in visualized veins or baker's cyst. Enlarged inguinal lymph nodes bilaterally.   Landry Mellow, RDMS, RVT  07/15/2015, 11:37 AM

## 2015-07-15 NOTE — Telephone Encounter (Signed)
Melissa with [facility] called to state pt has medicaid pending and they do not accept medicaid at this time. Will not be able to fulfil request for his unno boots

## 2015-07-15 NOTE — Plan of Care (Signed)
Problem: Respiratory: Goal: Ability to maintain adequate ventilation will improve Outcome: Progressing TCDB

## 2015-07-15 NOTE — Progress Notes (Signed)
PT Cancellation Note  Patient Details Name: Alex Ryan MRN: [id number] DOB: 05-Dec-1970   Cancelled Treatment:    Reason Eval/Treat Not Completed: PT screened, no needs identified, will sign off. Spoke with pt who denied need for PT services. Will sign off. Thanks.    Weston Anna, MPT Pager: [phone]

## 2015-07-15 NOTE — H&P (Addendum)
[facility] History and Physical  KHAM MIMMS [id number] DOB: 1970-06-10 DOA: 07/14/2015  Referring physician: ED physician PCP: Laurey Morale, MD  Specialists:   Chief Complaint: Cough, fever, right upper medial thigh pain  HPI: Alex Ryan is a 45 y.o. male with PMH of hypertension, hyperlipidemia, diabetes mellitus, asthma, depression, anxiety, chronic kidney disease-stage II, OSA on CPAP, recently treated HCAP, pleural effusion, groin cellulitis and right leg wound, who presents with cough, fever, right upper medial thigh pain.  Patient was recently hospitalized from 2/12-2/22/17 because of HCAP, pleural effusion, groin cellulitis and right leg wound. He was treated with IV antibiotics, and discharged on doxycycline for groin cellulitis. He also had right sided thoracentesis, pleural fluid analysis result is transudative with negative culture.   Patient reports that he has been taking doxycycline, his scrotum swelling and pain have significantly improved. His left leg wound also healing well. He developed fever today. He states that he continues to have cough with clear mucus production. He has runny nose, but no sore throat. He does not have chest pain. He also reports right upper medial thigh pain. No tenderness over count calf areas. He had negative LE venous doppler for DVT in previous admission. Patient denies nausea, vomiting, abdominal pain, diarrhea, symptoms of UTI or unilateral weakness.  In ED, patient was found to have temperature 102.5, no tachycardia, has tachypnea, lactate 0.83, WBC 4.8, negative urinalysis, slightly worsening renal function. Chest x-ray showed small bilaterally pleural effusion, vascular congestion and a mild left atelectasis. Patient is admitted to inpatient for further evaluation and treatment.  EKG: Independently reviewed.  Not done in ED, will get one.   Where does patient live?   At home   Can patient participate in ADLs?  Some   Review of  Systems:   General: has fevers, chills, has poor appetite, has fatigue HEENT: no blurry vision, hearing changes or sore throat Pulm: has dyspnea, coughing, no wheezing CV: no chest pain, no palpitations Abd: no nausea, vomiting, abdominal pain, diarrhea, constipation GU: no dysuria, burning on urination, increased urinary frequency, hematuria. Has scrotum swelling and pain. Ext: has leg edema. Neuro: no unilateral weakness, numbness, or tingling, no vision change or hearing loss Skin: has a wound over R lower leg MSK: No muscle spasm, no deformity, no limitation of range of movement in spin Heme: No easy bruising.  Travel history: No recent long distant travel.  Allergy:  Allergies  Allergen Reactions  . Codeine Hives and Nausea And Vomiting    Hyper  . Erythromycin Nausea And Vomiting  . Hibiclens [Chlorhexidine] Itching  . Other     Pre-op cleaning wipes:. itching  . Soap Itching    "dial soap"  . Vancomycin Swelling    Patient reports severe bilateral upper extremity swelling with vancomycin    Past Medical History  Diagnosis Date  . Hypertension   . Anxiety   . Shingles 2010  . Allergic rhinitis   . Diabetes mellitus     sees Dr. Jacelyn Pi   . Retinopathy of both eyes     sees Dr. Zadie Rhine   . Neuropathy in diabetes ([place])   . Cellulitis     legs - hx  . OSA (obstructive sleep apnea)     CPAP- not wearing  . Recurrent upper respiratory infection (URI)     frequent bronchcitis  . H/O hiatal hernia     pt thinks so  . Headache(784.0)     had migraine- not in years  .  Retinopathy     right eye  . Asthma   . Chronic kidney disease   . Bladder infection     in June -Was on Cipro  . Heart murmur     Followed by Dr Stanford Breed  . Hyperlipidemia   . CKD (chronic kidney disease), stage II     Past Surgical History  Procedure Laterality Date  . Tonsillectomy  1983  . Eye surgery  2012    Cataract with lens  Right  . Pars plana vitrectomy w/ scleral buckle   09/01/2011    Procedure: PARS PLANA VITRECTOMY WITH LASER FOR MACULAR HOLE;  Surgeon: Hurman Horn, MD;  Location: [place];  Service: Ophthalmology;  Laterality: Right;  Pars Plana Vitrectomy with Laser and Membrane Peel; Insertion Silicone Oil  . Pars plana vitrectomy  12/04/2011    Procedure: PARS PLANA VITRECTOMY WITH 25 GAUGE;  Surgeon: Hurman Horn, MD;  Location: [place];  Service: Ophthalmology;  Laterality: Left;  Insertion Silicon Oil [id number] CS and    . Vitrectomy  2013    per Dr. Zadie Rhine    Social History:  reports that he quit smoking about 5 years ago. He has quit using smokeless tobacco. He reports that he does not drink alcohol or use illicit drugs.  Family History:  Family History  Problem Relation Age of Onset  . Diabetes Mother   . Transient ischemic attack Father   . Cancer Father   . Anesthesia problems Neg Hx   . Diabetes Maternal Uncle   . Diabetes Maternal Grandmother   . Heart disease Maternal Grandfather      Prior to Admission medications   Medication Sig Start Date End Date Taking? Authorizing Provider  albuterol (PROVENTIL HFA;VENTOLIN HFA) 108 (90 Base) MCG/ACT inhaler Inhale 2 puffs into the lungs every 4 (four) hours as needed for wheezing or shortness of breath. PRN:  Allergies/breathing 07/10/15  Yes Robbie Lis, MD  Alcohol Swabs PADS Use up to 10 times per day for testing and insulin injections, diagnosis code is 250.00 02/01/13  Yes Laurey Morale, MD  amLODipine (NORVASC) 10 MG tablet Take 1 tablet (10 mg total) by mouth daily. 05/09/15  Yes Donne Hazel, MD  aspirin 325 MG tablet Take 325 mg by mouth daily. Reported on 05/29/2015   Yes Historical Provider, MD  cloNIDine (CATAPRES) 0.1 MG tablet Take 1 tablet (0.1 mg total) by mouth 2 (two) times daily. 06/13/15  Yes Laurey Morale, MD  diphenhydrAMINE (BENADRYL) 25 MG tablet Take 25 mg by mouth every 6 (six) hours as needed for allergies.    Yes Historical Provider, MD  doxycycline (VIBRA-TABS) 100 MG tablet  Take 1 tablet (100 mg total) by mouth every 12 (twelve) hours. 07/10/15  Yes Robbie Lis, MD  FLUoxetine (PROZAC) 40 MG capsule Take 1 capsule (40 mg total) by mouth daily. Patient taking differently: Take 40 mg by mouth 2 (two) times daily.  05/29/14  Yes Laurey Morale, MD  furosemide (LASIX) 40 MG tablet Take 1 tablet (40 mg total) by mouth 2 (two) times daily. 07/10/15  Yes Robbie Lis, MD  gabapentin (NEURONTIN) 300 MG capsule Take 1 capsule (300 mg total) by mouth 3 (three) times daily. 07/10/15  Yes Robbie Lis, MD  guaiFENesin (MUCINEX) 600 MG 12 hr tablet Take 600 mg by mouth 2 (two) times daily.   Yes Historical Provider, MD  insulin NPH Human (HUMULIN N) 100 UNIT/ML injection Inject 0.25 mLs (25  Units total) into the skin 2 (two) times daily before a meal. Patient taking differently: Inject 35 Units into the skin 2 (two) times daily before a meal.  05/09/15  Yes Donne Hazel, MD  insulin regular (HUMULIN R) 100 units/mL injection Inject 0.04 mLs (4 Units total) into the skin 3 (three) times daily before meals. Patient taking differently: Inject 4-8 Units into the skin 2 (two) times daily. 8 units in the morning, 4 in the evening 05/09/15  Yes Donne Hazel, MD  metoprolol succinate (TOPROL-XL) 50 MG 24 hr tablet Take 1 tablet (50 mg total) by mouth daily. Take with or immediately following a meal. Patient taking differently: Take 50 mg by mouth 2 (two) times daily. Take with or immediately following a meal. 05/21/15  Yes Laurey Morale, MD  Potassium Chloride ER 20 MEQ TBCR Take 20 mEq by mouth daily. 07/10/15  Yes Robbie Lis, MD    Physical Exam: Filed Vitals:   07/15/15 0200 07/15/15 0253 07/15/15 0300 07/15/15 0301  BP: 141/66 141/66 152/69 152/69  Pulse: 83 80 80   Temp:  102.7 F (39.3 C)    TempSrc:  Oral    Resp: 18 28 27    SpO2: 96% 95% 97%    General: Not in acute distress HEENT:       Eyes: PERRL, EOMI, no scleral icterus.       ENT: No discharge from the ears and  nose, no pharynx injection, no tonsillar enlargement.        Neck: No JVD, no bruit, no mass felt. Heme: No neck lymph node enlargement. Cardiac: S1/S2, RRR, No murmurs, No gallops or rubs. Pulm: No rales, wheezing, rhonchi or rubs. Abd: Soft, nondistended, nontender, no rebound pain, no organomegaly, BS present. Ext: 2+ pitting leg edema bilaterally. Has venous insufficient change. 2+DP/PT pulse bilaterally. Has tenderness over R upper medial thigh. Musculoskeletal: No joint deformities, No joint redness or warmth, no limitation of ROM in spin. Skin: has a scabbed wound over R lateral lower leg, ~3 cm in size (his previous wound over posterior leg has healed) Neuro: Alert, oriented X3, cranial nerves II-XII grossly intact, moves all extremities normally. Psych: Patient is not psychotic, no suicidal or hemocidal ideation.  Labs on Admission:  Basic Metabolic Panel:  Recent Labs Lab 07/10/15 0504 07/14/15 2303  NA 140 138  K 4.7 4.9  CL 100* 100*  CO2 34* 30  GLUCOSE 246* 151*  BUN 18 21*  CREATININE 1.02 1.22  CALCIUM 8.3* 8.6*   Liver Function Tests:  Recent Labs Lab 07/08/15 1950  PROT 6.2*   No results for input(s): LIPASE, AMYLASE in the last 168 hours. No results for input(s): AMMONIA in the last 168 hours. CBC:  Recent Labs Lab 07/10/15 0504 07/14/15 2303  WBC 6.8 4.8  NEUTROABS  --  3.7  HGB 10.6* 11.0*  HCT 34.8* 35.6*  MCV 93.8 92.2  PLT 303 249   Cardiac Enzymes: No results for input(s): CKTOTAL, CKMB, CKMBINDEX, TROPONINI in the last 168 hours.  BNP (last 3 results)  Recent Labs  06/30/15 2245  BNP 168.6*    ProBNP (last 3 results) No results for input(s): PROBNP in the last 8760 hours.  CBG:  Recent Labs Lab 07/10/15 0522 07/10/15 0739 07/10/15 1203 07/10/15 1223 07/10/15 1339  GLUCAP 212* 205* 48* 65 106*    Radiological Exams on Admission: Dg Chest 2 View  07/14/2015  CLINICAL DATA:  Acute onset of cough and fever. Nausea and  vomiting. Loss of appetite. Initial encounter. EXAM: CHEST  2 VIEW COMPARISON:  Chest radiograph performed 07/08/2015 FINDINGS: The lungs are well-aerated. Vascular congestion is noted. Small bilateral pleural effusions are noted. Mild left basilar atelectasis is seen. There is no evidence of pneumothorax. The heart is normal in size; the mediastinal contour is within normal limits. No acute osseous abnormalities are seen. IMPRESSION: Small bilateral pleural effusions noted. Vascular congestion noted. Mild left basilar atelectasis seen. Electronically Signed   By: Garald Balding M.D.   On: 07/14/2015 23:46    Assessment/Plan Principal Problem:   Sepsis ([place]) Active Problems:   Diabetic retinopathy ([place])   Anxiety state   Essential hypertension   OSA (obstructive sleep apnea)   Diabetic peripheral neuropathy associated with type 2 diabetes mellitus (H[facility])   Hypertension   Diabetes mellitus ([place])   HCAP (healthcare-associated pneumonia)   Pleural effusion   Cellulitis of groin   Leg wound, right   Upper leg pain, right upper medial thigh    Cough   Asthma   Acute on chronic kidney failure-II   Sepsis [facility]): Patient meets sepsis criteria with fever and tachypnea. Lactate is normal. Hemodynamically stable. He has known groin cellulitis, but it has been treated with oral doxycycline with significant improvement per patient, not sure if this infection has contributed to the current sepsis. Patient;s R leg wounds are also healing well, does not look like to have contributed to his sepsis. His chest x-ray showed improvement of pleural effusion, less likely to contribute to his sepsis. Patient has new right upper medial thigh pain, etiology is not clear. Potential differential diagnoses include DVT, phlebitis or myositis, which may have contributed to the sepsis. Patient is allergic to vancomycin, consulted pharmacist, who recommended zyvox (Linezolid)  -Will admit to tele bed -Hold lasix -IVF: 1L  NS and then 75 cc/h -IV Zyvox and zosyn -f/u Bx x 2 -When necessary Zofran for nausea and Percocet for pain  R leg pain: -check LE venous doppler. If negative for DVT, will consider MRI to r/o myositis  Cough: Patient has cough with clear mucus production. He also has runny nose. His x-ray has no new infiltration. No chest pain. Less likely to have new pneumonia. -Check flu PCR, and the respiratory virus panel -Mucinex for cough  Asthma: stable -Atrovent nebulizer, when necessary Albuterol nebulizer for shortness of breath  AoCKD-II: Baseline Cre is 1.0, his Cre 1.22 is on admission. Likely due to prerenal secondary to dehydration and continuation of diruetics  - IVF as above - Check FeUrea - Follow up renal function by BMP - Hold Diuretics, lasix  R leg wound: he had two wounds, one on the posterior leg has healed. Another one on the R lateral lower leg has scabbed. No discharge. -wound care consult  Leg edema: pt has venous insufficient leg edema. He has been on oral Lasix at home. -Will hold Lasix due to sepsis  Pleural effusion: S/p of right thoracentesis on 2/20. Chest x-ray showed small bilateral effusion. Patient does not have chest pain. -observe now  DM-II: Last A1c 12.3 on 05/06/15, poorly controled. Patient is taking NPH insulin at home -will decrease NPH insulin dose from 35 to 25 units  -SSI  OSA: -CPAP  HTN: -Amlodipine, clonidine, metoprolol -IV hydralazine when necessary  Depression and anxiety: Stable, no suicidal or homicidal ideations. -Continue home medications: Prozac  DVT ppx: SQ Lovenox  Code Status: Full code Family Communication:  Yes, patient's wife and mother at bed side Disposition Plan:  Admit to inpatient   Date of Service 07/15/2015    Ivor Costa [facility] Pager [phone]  If 7PM-7AM, please contact night-coverage [email or web address] Password [facility] 07/15/2015, 3:28 AM

## 2015-07-15 NOTE — Consult Note (Signed)
WOC wound consult note Reason for Consult:Bilateral LE venous insufficiency.  Patient is followed by the outpatient wound care center at [facility]. Wound type:Venous insufficiency Pressure Ulcer POA: No Measurement:No wounds currently.  Full thickness ulcer on right lateral patella has recently healed. Wound GR:[id number] Drainage (amount, consistency, odor) None Periwound:Dry, scaly tissue with hemosiderin staining in the gaiter area bialterally Dressing procedure/placement/frequency:I will ask Nursing to wash and dry LEs, follow with moisturizing.  Ortho Tech to place bilateral [employer]. Float heels. [place] nursing team will not follow, but will remain available to this patient, the nursing and medical teams.  Please re-consult if needed. Thanks, Maudie Flakes, MSN, RN, [place], Arther Abbott  Pager# [phone]

## 2015-07-15 NOTE — ED Notes (Signed)
Patient states he is allergic to Vancomycin - reports he has hx of severe bilateral extremity swelling with IV administration.  Pharmacy and admitting MD notified.

## 2015-07-15 NOTE — Telephone Encounter (Signed)
Per Dr. Sarajane Jews we need to find out which home health agency takes medicaid and contact that office, also we can let pt know what is going on.

## 2015-07-16 ENCOUNTER — Ambulatory Visit: Payer: Self-pay | Admitting: Family Medicine

## 2015-07-16 DIAGNOSIS — M79659 Pain in unspecified thigh: Secondary | ICD-10-CM

## 2015-07-16 DIAGNOSIS — G4733 Obstructive sleep apnea (adult) (pediatric): Secondary | ICD-10-CM

## 2015-07-16 DIAGNOSIS — E1142 Type 2 diabetes mellitus with diabetic polyneuropathy: Secondary | ICD-10-CM

## 2015-07-16 DIAGNOSIS — N179 Acute kidney failure, unspecified: Secondary | ICD-10-CM

## 2015-07-16 DIAGNOSIS — N189 Chronic kidney disease, unspecified: Secondary | ICD-10-CM

## 2015-07-16 LAB — CBC
HEMATOCRIT: 30.9 % — AB (ref 39.0–52.0)
Hemoglobin: 9.7 g/dL — ABNORMAL LOW (ref 13.0–17.0)
MCH: 28.4 pg (ref 26.0–34.0)
MCHC: 31.4 g/dL (ref 30.0–36.0)
MCV: 90.6 fL (ref 78.0–100.0)
Platelets: 207 10*3/uL (ref 150–400)
RBC: 3.41 MIL/uL — AB (ref 4.22–5.81)
RDW: 13.9 % (ref 11.5–15.5)
WBC: 3.6 10*3/uL — AB (ref 4.0–10.5)

## 2015-07-16 LAB — GLUCOSE, CAPILLARY
Glucose-Capillary: 102 mg/dL — ABNORMAL HIGH (ref 65–99)
Glucose-Capillary: 146 mg/dL — ABNORMAL HIGH (ref 65–99)

## 2015-07-16 LAB — RESPIRATORY VIRUS PANEL
Adenovirus: NEGATIVE
INFLUENZA A: NEGATIVE
INFLUENZA B 1: NEGATIVE
Metapneumovirus: NEGATIVE
PARAINFLUENZA 1 A: NEGATIVE
PARAINFLUENZA 2 A: NEGATIVE
Parainfluenza 3: NEGATIVE
RESPIRATORY SYNCYTIAL VIRUS B: NEGATIVE
Respiratory Syncytial Virus A: NEGATIVE
Rhinovirus: NEGATIVE

## 2015-07-16 LAB — BASIC METABOLIC PANEL
ANION GAP: 6 (ref 5–15)
BUN: 20 mg/dL (ref 6–20)
CHLORIDE: 103 mmol/L (ref 101–111)
CO2: 28 mmol/L (ref 22–32)
Calcium: 8 mg/dL — ABNORMAL LOW (ref 8.9–10.3)
Creatinine, Ser: 1.27 mg/dL — ABNORMAL HIGH (ref 0.61–1.24)
GFR calc Af Amer: >60 mL/min (ref >60)
GFR calc non Af Amer: >60 mL/min (ref >60)
GLUCOSE: 142 mg/dL — AB (ref 65–99)
POTASSIUM: 4.3 mmol/L (ref 3.5–5.1)
Sodium: 137 mmol/L (ref 135–145)

## 2015-07-16 LAB — UREA NITROGEN, URINE: Urea Nitrogen, Ur: 587 mg/dL

## 2015-07-16 MED ORDER — AMOXICILLIN-POT CLAVULANATE 875-125 MG PO TABS: 1.0000 | ORAL_TABLET | Freq: Two times a day (BID) | ORAL | Status: DC

## 2015-07-16 NOTE — Discharge Summary (Signed)
Physician Discharge Summary  Alex Ryan [id number] DOB: 1971-04-25 DOA: 07/14/2015  PCP: Laurey Morale, MD  Admit date: 07/14/2015 Discharge date: 07/16/2015  Time spent: 50 minutes  Discharge Condition: stable    Discharge Diagnoses:  Principal Problem:   Sepsis ([place]) Active Problems:   Diabetic retinopathy ([place])   Anxiety state   Essential hypertension   OSA (obstructive sleep apnea)   Diabetic peripheral neuropathy associated with type 2 diabetes mellitus ([place])   Hypertension   Diabetes mellitus ([place])   HCAP (healthcare-associated pneumonia)   Pleural effusion   Cellulitis of groin   Leg wound, right   Upper leg pain, right upper medial thigh    Cough   Asthma   Acute on chronic kidney failure-II   Pyrexia   History of present illness:  Alex Ryan is a 45 y.o. male with PMH of hypertension, hyperlipidemia, diabetes mellitus, asthma, depression, anxiety, chronic kidney disease-stage II, OSA on CPAP, recently treated for HCAP, pleural effusion, scrotal cellulitis and right leg wound, who presents with cough and fever. He states that he has continued to take his Doxycycline and has about 3 days left. His cellulitis has not recurred. He has a dry cough which is actually improved from his last admission.    Hospital Course:  Fever - 102 degrees- fever now intermittently 100 degree- patient anxious to go home today - CXR neg for infiltrates- cough is improving - Influenza negative-- has sinus drainage and may have a viral URI- RSV panel still pending - UA negative - groin/ scrotal area also improving - no rash or ulcers noted- has an UNNA boot in left leg and tell me that there was no ulcer or rash when this was placed and it was essentially placed for chronic leg swelling - blood cultures negative  - was completing a course of Doxycycline- he was started on Zyvox and Zosyn when admitted - as he is anxious to go home and I have no source for his fevers, will d/c  home with Augmentin for 5 days and he will continue Doxy until course complete (3 more days)  - he will see his PCP [place] for follow up- he knows to call his PCP or return to the hospital if high fevers recur  Right leg pain? On admission - no pain yesterday or toady- ultrasound negative of rDVT  Acute on chronic renal failure - holding diuretics during hospital stay as he appeared dehydrated - no h/o CHF-    Pleural effusion - S/p of right thoracentesis on 2/20 - Chest x-ray showed small bilateral effusion - no hypoxia or dyspnea -observe for now  DM-II - Last A1c 12.3 on 05/06/15, poorly controled. Patient is taking NPH insulin at home - decreased NPH insulin dose from 35 to 25 units to prevent hypoglycemia in the hospital  Procedures:  none  Consultations:  none  Discharge Exam: [facility] Weights   07/15/15 0520  Weight: 121.7 kg (268 lb 4.8 oz)   Filed Vitals:   07/16/15 0504 07/16/15 1500  BP: 143/69 124/61  Pulse: 72 82  Temp: 100.5 F (38.1 C) 98.4 F (36.9 C)  Resp: 19 19    General: AAO x 3, no distress Cardiovascular: RRR, no murmurs  Respiratory: clear to auscultation bilaterally GI: soft, non-tender, non-distended, bowel sound positive  Discharge Instructions You were cared for by a hospitalist during your hospital stay. If you have any questions about your discharge medications or the care you received while you were in  the hospital after you are discharged, you can call the unit and asked to speak with the hospitalist on call if the hospitalist that took care of you is not available. Once you are discharged, your primary care physician will handle any further medical issues. Please note that NO REFILLS for any discharge medications will be authorized once you are discharged, as it is imperative that you return to your primary care physician (or establish a relationship with a primary care physician if you do not have one) for your aftercare needs so that  they can reassess your need for medications and monitor your lab values.      Discharge Instructions    Discharge instructions    Complete by:  As directed   Diabetic and low sodium, low fat diet     Increase activity slowly    Complete by:  As directed             Medication List    TAKE these medications        albuterol 108 (90 Base) MCG/ACT inhaler  Commonly known as:  PROVENTIL HFA;VENTOLIN HFA  Inhale 2 puffs into the lungs every 4 (four) hours as needed for wheezing or shortness of breath. PRN:  Allergies/breathing     Alcohol Swabs Pads  Use up to 10 times per day for testing and insulin injections, diagnosis code is 250.00     amLODipine 10 MG tablet  Commonly known as:  NORVASC  Take 1 tablet (10 mg total) by mouth daily.     amoxicillin-clavulanate 875-125 MG tablet  Commonly known as:  AUGMENTIN  Take 1 tablet by mouth 2 (two) times daily.     aspirin 325 MG tablet  Take 325 mg by mouth daily. Reported on 05/29/2015     cloNIDine 0.1 MG tablet  Commonly known as:  CATAPRES  Take 1 tablet (0.1 mg total) by mouth 2 (two) times daily.     diphenhydrAMINE 25 MG tablet  Commonly known as:  BENADRYL  Take 25 mg by mouth every 6 (six) hours as needed for allergies.     doxycycline 100 MG tablet  Commonly known as:  VIBRA-TABS  Take 1 tablet (100 mg total) by mouth every 12 (twelve) hours.     FLUoxetine 40 MG capsule  Commonly known as:  PROZAC  Take 1 capsule (40 mg total) by mouth daily.     furosemide 40 MG tablet  Commonly known as:  LASIX  Take 1 tablet (40 mg total) by mouth 2 (two) times daily.     gabapentin 300 MG capsule  Commonly known as:  NEURONTIN  Take 1 capsule (300 mg total) by mouth 3 (three) times daily.     guaiFENesin 600 MG 12 hr tablet  Commonly known as:  MUCINEX  Take 600 mg by mouth 2 (two) times daily.     insulin NPH Human 100 UNIT/ML injection  Commonly known as:  HUMULIN N  Inject 0.25 mLs (25 Units total) into the  skin 2 (two) times daily before a meal.     insulin regular 100 units/mL injection  Commonly known as:  HUMULIN R  Inject 0.04 mLs (4 Units total) into the skin 3 (three) times daily before meals.     metoprolol succinate 50 MG 24 hr tablet  Commonly known as:  TOPROL-XL  Take 1 tablet (50 mg total) by mouth daily. Take with or immediately following a meal.     Potassium Chloride ER 20 MEQ  Tbcr  Commonly known as:  KLOR-CON 10  Take 20 mEq by mouth daily.       Allergies  Allergen Reactions  . Codeine Hives and Nausea And Vomiting    Hyper  . Erythromycin Nausea And Vomiting  . Hibiclens [Chlorhexidine] Itching  . Other     Pre-op cleaning wipes:. itching  . Soap Itching    "dial soap"  . Vancomycin Swelling    Patient reports severe bilateral upper extremity swelling with vancomycin      The results of significant diagnostics from this hospitalization (including imaging, microbiology, ancillary and laboratory) are listed below for reference.    Significant Diagnostic Studies: Dg Chest 2 View  07/14/2015  CLINICAL DATA:  Acute onset of cough and fever. Nausea and vomiting. Loss of appetite. Initial encounter. EXAM: CHEST  2 VIEW COMPARISON:  Chest radiograph performed 07/08/2015 FINDINGS: The lungs are well-aerated. Vascular congestion is noted. Small bilateral pleural effusions are noted. Mild left basilar atelectasis is seen. There is no evidence of pneumothorax. The heart is normal in size; the mediastinal contour is within normal limits. No acute osseous abnormalities are seen. IMPRESSION: Small bilateral pleural effusions noted. Vascular congestion noted. Mild left basilar atelectasis seen. Electronically Signed   By: Garald Balding M.D.   On: 07/14/2015 23:46   Dg Chest 2 View  07/03/2015  CLINICAL DATA:  Shortness of breath, recent pneumonia, pleural effusion, history hypertension, diabetes mellitus, asthma, former smoker EXAM: CHEST  2 VIEW COMPARISON:  06/30/2015 ;  correlation interval CT chest 07/01/2015 FINDINGS: Normal heart size mediastinal contours, and pulmonary vascularity. Patchy BILATERAL pulmonary infiltrates are again identified, especially RIGHT upper lobe, RIGHT base, and LEFT lower lobe. Findings most likely represent pneumonia. BILATERAL pleural effusions. No pneumothorax. Bones demineralized. IMPRESSION: Persistent BILATERAL pulmonary infiltrates consistent with pneumonia. BILATERAL pleural effusions. Electronically Signed   By: Lavonia Dana M.D.   On: 07/03/2015 08:41   Dg Chest 2 View  06/30/2015  CLINICAL DATA:  Cough, shortness of breath EXAM: CHEST  2 VIEW COMPARISON:  12/04/2011 FINDINGS: Multifocal patchy opacities in the bilateral upper lobes (right greater than left) and bilateral lower lobes, suspicious for pneumonia. Suspected small right pleural effusion. No pneumothorax. The heart is normal in size. Mild degenerative changes of the visualized thoracolumbar spine. IMPRESSION: Multifocal patchy opacities, suspicious for pneumonia. Suspected small right pleural effusion. Electronically Signed   By: Julian Hy M.D.   On: 06/30/2015 17:52   Ct Angio Chest Pe W/cm &/or Wo Cm  07/01/2015  CLINICAL DATA:  Increasing shortness of breath for 2 days. EXAM: CT ANGIOGRAPHY CHEST WITH CONTRAST TECHNIQUE: Multidetector CT imaging of the chest was performed using the standard protocol during bolus administration of intravenous contrast. Multiplanar CT image reconstructions and MIPs were obtained to evaluate the vascular anatomy. CONTRAST:  180mL OMNIPAQUE IOHEXOL 350 MG/ML SOLN COMPARISON:  Chest x-ray dated 06/30/2015 FINDINGS: There are no pulmonary emboli. Heart size is normal. There is slight mediastinal adenopathy. There is a 13 mm azygos lymph node with multiple small lymph nodes adjacent to the arch of the aorta. There is a 2.5 cm nodal mass in the subcarinal region. There are large bilateral pleural effusions with compressive atelectasis at  both lung bases, left more than right. There are patchy bilateral upper lobe pulmonary infiltrates. I suspect the adenopathy is secondary to these infiltrates. There is hepatomegaly, nonspecific. The patient has subcutaneous edema in the upper abdomen and lower chest. Review of the MIP images confirms the above findings. IMPRESSION:  1. No pulmonary emboli. 2. Patchy bilateral upper lobe infiltrates with probable reactive mediastinal adenopathy. 3. Large bilateral pleural effusions with compressive atelectasis of the lower lobes. 4. Nonspecific hepatomegaly and anasarca. Electronically Signed   By: Lorriane Shire M.D.   On: 07/01/2015 09:43   Dg Chest Port 1 View  07/08/2015  CLINICAL DATA:  Post right thoracentesis. EXAM: PORTABLE CHEST 1 VIEW COMPARISON:  07/06/2015 and earlier. FINDINGS: No evidence of pneumothorax after right thoracentesis. Near complete resolution of right pleural effusion. Improved aeration in the right lung base with only minimal linear atelectasis persisting. Stable moderate-sized left pleural effusion and associated dense consolidation in the left lower lobe. Interval resolution of interstitial pulmonary edema. IMPRESSION: 1. No pneumothorax after right thoracentesis. 2. Near complete resolution of the right pleural effusion with improved aeration in the right lower lobe. Only minimal linear atelectasis persists. 3. Stable moderate-sized left pleural effusion and associated dense passive atelectasis and/or pneumonia in the left lower lobe. 4. Interval resolution of interstitial pulmonary edema. Electronically Signed   By: Evangeline Dakin M.D.   On: 07/08/2015 17:23   Dg Chest Port 1 View  07/06/2015  CLINICAL DATA:  Productive cough and fever for the past 6 days. Pneumonia. EXAM: PORTABLE CHEST 1 VIEW COMPARISON:  07/03/2015. FINDINGS: Mild increase in patchy opacity at the right lung base with a mild increase in right pleural fluid. Mildly decreased right perihilar and left basilar  opacity. Mild increase in prominence of the pulmonary vasculature and interstitial markings. Stable borderline cardiomegaly. No significant change in a small left pleural effusion. IMPRESSION: 1. Mildly increased right basilar pneumonia and right pleural fluid. 2. Improved right perihilar and left basilar atelectasis or pneumonia. 3. Stable small left pleural effusion. 4. Mild increase in pulmonary vascular congestion and mild interstitial pulmonary edema. Electronically Signed   By: Claudie Revering M.D.   On: 07/06/2015 10:21   US Abdomen Limited Ruq  07/10/2015  CLINICAL DATA:  Hepatomegaly, possible cirrhosis EXAM: US ABDOMEN LIMITED - RIGHT UPPER QUADRANT COMPARISON:  CT scan of the chest 07/01/2015 FINDINGS: Gallbladder: No shadowing gallstones are noted within gallbladder. A gallbladder wall polyp measures 7 mm. No thickening of gallbladder wall. No sonographic Murphy's sign. Common bile duct: Diameter: 6.8 mm in diameter mild prominent in size. Liver: No focal hepatic mass. There is heterogeneous mild increased echogenicity of the liver suspicious for fatty infiltration. No intrahepatic biliary ductal dilatation. IMPRESSION: 1. No shadowing gallstones are noted within gallbladder. There is 7 mm gallbladder wall polyp. No thickening of gallbladder wall. No sonographic Murphy's sign. Mild prominent size CBD measures 6.8 mm in diameter. 2. Diffuse mild increased echogenicity of the liver suspicious for mild fatty infiltration. No focal hepatic mass. No intrahepatic biliary ductal dilatation. Electronically Signed   By: Lahoma Crocker M.D.   On: 07/10/2015 10:28    Microbiology: Recent Results (from the past 240 hour(s))  Body fluid culture     Status: None   Collection Time: 07/08/15  5:06 PM  Result Value Ref Range Status   Specimen Description PLEURAL RIGHT  Final   Special Requests Normal  Final   Gram Stain   Final    RARE WBC PRESENT, PREDOMINANTLY MONONUCLEAR NO ORGANISMS SEEN    Culture   Final     NO GROWTH 3 DAYS Performed at [facility]    Report Status 07/11/2015 FINAL  Final  Culture, blood (Routine X 2) w Reflex to ID Panel     Status: None (Preliminary result)  Collection Time: 07/15/15 12:42 AM  Result Value Ref Range Status   Specimen Description BLOOD RIGHT AC  Final   Special Requests BOTTLES DRAWN AEROBIC AND ANAEROBIC 5CC  Final   Culture   Final    NO GROWTH 1 DAY Performed at [facility]    Report Status PENDING  Incomplete  Culture, blood (Routine X 2) w Reflex to ID Panel     Status: None (Preliminary result)   Collection Time: 07/15/15 12:42 AM  Result Value Ref Range Status   Specimen Description BLOOD RIGHT FA  Final   Special Requests BOTTLES DRAWN AEROBIC AND ANAEROBIC 5CC  Final   Culture   Final    NO GROWTH 1 DAY Performed at [facility]    Report Status PENDING  Incomplete     Labs: Basic Metabolic Panel:  Recent Labs Lab 07/10/15 0504 07/14/15 2303 07/15/15 0541 07/16/15 0453  NA 140 138 137 137  K 4.7 4.9 4.8 4.3  CL 100* 100* 102 103  CO2 34* 30 28 28   GLUCOSE 246* 151* 193* 142*  BUN 18 21* 18 20  CREATININE 1.02 1.22 1.36* 1.27*  CALCIUM 8.3* 8.6* 8.0* 8.0*   Liver Function Tests:  Recent Labs Lab 07/15/15 0541  AST 37  ALT 27  ALKPHOS 172*  BILITOT 1.1  PROT 5.9*  ALBUMIN 2.5*   No results for input(s): LIPASE, AMYLASE in the last 168 hours. No results for input(s): AMMONIA in the last 168 hours. CBC:  Recent Labs Lab 07/10/15 0504 07/14/15 2303 07/15/15 0541 07/16/15 0453  WBC 6.8 4.8 4.2 3.6*  NEUTROABS  --  3.7  --   --   HGB 10.6* 11.0* 9.8* 9.7*  HCT 34.8* 35.6* 31.9* 30.9*  MCV 93.8 92.2 92.5 90.6  PLT 303 249 207 207   Cardiac Enzymes: No results for input(s): CKTOTAL, CKMB, CKMBINDEX, TROPONINI in the last 168 hours. BNP: BNP (last 3 results)  Recent Labs  06/30/15 2245 07/15/15 0541  BNP 168.6* 201.0*    ProBNP (last 3 results) No results for input(s):  PROBNP in the last 8760 hours.  CBG:  Recent Labs Lab 07/15/15 1208 07/15/15 1644 07/15/15 2155 07/16/15 0713 07/16/15 1156  GLUCAP 126* 131* 159* 102* 146*       SignedDebbe Odea, MD [facility] 07/16/2015, 3:31 PM

## 2015-07-16 NOTE — Progress Notes (Signed)
OT Cancellation Note  Patient Details Name: DREVIN WILES MRN: [id number] DOB: [birth year]   Cancelled Treatment:    Reason Eval/Treat Not Completed: OT screened, no needs identified, will sign off -- Spoke with patient. He reports occasional difficulty with LB dressing but has long-handled AE. Encouraged patient to use. Patient denied review of AE use or OT needs at this time. Will sign off.  Kairen Hallinan A 07/16/2015, 9:34 AM

## 2015-07-17 ENCOUNTER — Ambulatory Visit (INDEPENDENT_AMBULATORY_CARE_PROVIDER_SITE_OTHER): Payer: Self-pay | Admitting: Family Medicine

## 2015-07-17 ENCOUNTER — Encounter: Payer: Self-pay | Admitting: Family Medicine

## 2015-07-17 VITALS — BP 138/74 | HR 83 | Temp 101.4°F | Ht 70.0 in | Wt 270.0 lb

## 2015-07-17 DIAGNOSIS — J069 Acute upper respiratory infection, unspecified: Secondary | ICD-10-CM

## 2015-07-17 DIAGNOSIS — L03314 Cellulitis of groin: Secondary | ICD-10-CM

## 2015-07-17 MED ORDER — DOXYCYCLINE HYCLATE 100 MG PO TABS
100.0000 mg | ORAL_TABLET | Freq: Two times a day (BID) | ORAL | Status: DC
Start: 2015-07-17 — End: 2015-08-14

## 2015-07-17 MED FILL — DOXYCYCLINE HYCLATE 100 MG: 100 | 30 days supply | Qty: 60 | Fill #0

## 2015-07-17 NOTE — Progress Notes (Signed)
   Subjective:    Patient ID: Alex Ryan, male    DOB: August 10, 1970, 45 y.o.   MRN: [id number]  HPI Here to follow up a hospital stay from 07-14-15 to 07-06-15 for fever to 102 degrees and a non-productive cough. His WBC remained normal and was 3.6 yesterday. His lungs were clear on exam. His CXR was clear except for small pleural effusions. His viral screens including influenza were all negative. He was given a few days of IV antibiotics and was sent home with 5 days of Augmentin. Prior to this admission he had been taking Doxycycline for a scrotal cellulitis and this had been responding quite well. However this was stopped when he went into the hospital and during the next few days the swelling and cellulitis rebounded. He was then put on a short course of this and was told to see [place] today. He still has intermittent fevers as high as 101 degrees and still has a dry cough. No chest pain or SOB. He is drinking and eating again normally.    Review of Systems  Constitutional: Positive for fever and fatigue.  HENT: Negative.   Eyes: Negative.   Respiratory: Positive for cough. Negative for shortness of breath and wheezing.   Cardiovascular: Negative.   Genitourinary: Positive for penile swelling and scrotal swelling.  Neurological: Negative.        Objective:   Physical Exam  Constitutional: He is oriented to person, place, and time. He appears well-developed and well-nourished.  HENT:  Right Ear: External ear normal.  Left Ear: External ear normal.  Nose: Nose normal.  Mouth/Throat: Oropharynx is clear and moist.  Eyes: Conjunctivae are normal.  Neck: No thyromegaly present.  Cardiovascular: Normal rate, regular rhythm, normal heart sounds and intact distal pulses.   Pulmonary/Chest: Effort normal and breath sounds normal. No respiratory distress. He has no wheezes. He has no rales.  Abdominal: Soft. Bowel sounds are normal. He exhibits no distension and no mass. There is no tenderness.  There is no rebound and no guarding.  Lymphadenopathy:    He has no cervical adenopathy.  Neurological: He is alert and oriented to person, place, and time.  Skin:  The scrotum is very swollen, red, warm, and tender           Assessment & Plan:  The scrotal cellulitis has flared up again, and I think this is the source of his fevers. We will get him back on Doxycycline and will plan to stay on this for at least the next 30 days. His lungs are clear. He is recovering from a viral URI. Recheck in one week.

## 2015-07-17 NOTE — Progress Notes (Signed)
Pre visit review using our clinic review tool, if applicable. No additional management support is needed unless otherwise documented below in the visit note. 

## 2015-07-20 LAB — CULTURE, BLOOD (ROUTINE X 2)
Culture: NO GROWTH
Culture: NO GROWTH

## 2015-07-22 MED FILL — FLUoxetine HCL 40 MG CAPS: 40 | 60 days supply | Qty: 60 | Fill #1

## 2015-07-23 ENCOUNTER — Other Ambulatory Visit (INDEPENDENT_AMBULATORY_CARE_PROVIDER_SITE_OTHER): Payer: Self-pay

## 2015-07-23 DIAGNOSIS — Z794 Long term (current) use of insulin: Secondary | ICD-10-CM

## 2015-07-23 DIAGNOSIS — E1165 Type 2 diabetes mellitus with hyperglycemia: Secondary | ICD-10-CM

## 2015-07-23 LAB — LIPID PANEL
CHOLESTEROL: 151 mg/dL (ref 0–200)
HDL: 37.7 mg/dL — ABNORMAL LOW (ref >39.00)
LDL CALC: 90 mg/dL (ref 0–99)
NonHDL: 113.64
TRIGLYCERIDES: 119 mg/dL (ref 0.0–149.0)
Total CHOL/HDL Ratio: 4
VLDL: 23.8 mg/dL (ref 0.0–40.0)

## 2015-07-23 LAB — MICROALBUMIN / CREATININE URINE RATIO
Creatinine,U: 96.2 mg/dL
Microalb Creat Ratio: 380.2 mg/g — ABNORMAL HIGH (ref 0.0–30.0)
Microalb, Ur: 365.9 mg/dL — ABNORMAL HIGH (ref 0.0–1.9)

## 2015-07-23 LAB — BASIC METABOLIC PANEL
BUN: 9 mg/dL (ref 6–23)
CALCIUM: 8.5 mg/dL (ref 8.4–10.5)
CO2: 30 mEq/L (ref 19–32)
CREATININE: 0.72 mg/dL (ref 0.40–1.50)
Chloride: 105 mEq/L (ref 96–112)
GFR: 125.18 mL/min (ref >60.00)
GLUCOSE: 93 mg/dL (ref 70–99)
Potassium: 3.9 mEq/L (ref 3.5–5.1)
Sodium: 142 mEq/L (ref 135–145)

## 2015-07-23 LAB — HEMOGLOBIN A1C: Hgb A1c MFr Bld: 6.8 % — ABNORMAL HIGH (ref 4.6–6.5)

## 2015-07-24 ENCOUNTER — Ambulatory Visit (INDEPENDENT_AMBULATORY_CARE_PROVIDER_SITE_OTHER)
Admission: RE | Admit: 2015-07-24 | Discharge: 2015-07-24 | Disposition: A | Discharge status: Home / Self Care | Payer: Self-pay | Source: Ambulatory Visit | Attending: Adult Health | Admitting: Adult Health

## 2015-07-24 ENCOUNTER — Telehealth: Payer: Self-pay | Admitting: Pulmonary Disease

## 2015-07-24 ENCOUNTER — Ambulatory Visit (INDEPENDENT_AMBULATORY_CARE_PROVIDER_SITE_OTHER): Payer: Self-pay | Admitting: Adult Health

## 2015-07-24 ENCOUNTER — Encounter: Payer: Self-pay | Admitting: Adult Health

## 2015-07-24 VITALS — HR 74 | Temp 98.0°F | Ht 70.0 in | Wt 250.0 lb

## 2015-07-24 DIAGNOSIS — J90 Pleural effusion, not elsewhere classified: Secondary | ICD-10-CM

## 2015-07-24 DIAGNOSIS — J189 Pneumonia, unspecified organism: Secondary | ICD-10-CM

## 2015-07-24 NOTE — Patient Instructions (Signed)
Continue on current regimen .  Follow up with Dr. Lake Bells in 3-4 weeks with chest xray .  Please contact office for sooner follow up if symptoms do not improve or worsen or seek emergency care

## 2015-07-24 NOTE — Telephone Encounter (Signed)
Pt was seen today by TP for HFU with the following instructions:  Patient Instructions     Continue on current regimen .  Follow up with Dr. Lake Bells in 3-4 weeks with chest xray .  Please contact office for sooner follow up if symptoms do not improve or worsen or seek emergency care    -----  BQ first available is April 21.  Spoke with Tammy P and was advised April 21 appt with BQ is fine.  Pt to call office if needed prior.    Called, spoke with pt.  Discussed above.  Pt agreeable to April 21 appt with BQ and will arrive 15 min early for cxr prior.  Pt is aware to call office if needed prior.  Pt verbalized understanding, is in agreement with plan, and voiced no further questions or concerns at this time.

## 2015-07-26 ENCOUNTER — Ambulatory Visit (INDEPENDENT_AMBULATORY_CARE_PROVIDER_SITE_OTHER): Payer: Self-pay | Admitting: Endocrinology

## 2015-07-26 ENCOUNTER — Encounter: Payer: Self-pay | Admitting: Endocrinology

## 2015-07-26 VITALS — BP 154/84 | HR 72 | Temp 98.0°F | Resp 14 | Ht 72.0 in | Wt 253.0 lb

## 2015-07-26 DIAGNOSIS — Z794 Long term (current) use of insulin: Secondary | ICD-10-CM

## 2015-07-26 DIAGNOSIS — R809 Proteinuria, unspecified: Secondary | ICD-10-CM

## 2015-07-26 DIAGNOSIS — E1165 Type 2 diabetes mellitus with hyperglycemia: Secondary | ICD-10-CM

## 2015-07-26 DIAGNOSIS — E1129 Type 2 diabetes mellitus with other diabetic kidney complication: Secondary | ICD-10-CM

## 2015-07-26 NOTE — Progress Notes (Signed)
Patient ID: Alex Ryan, male   DOB: [birth year], 45 y.o.   MRN: [id number]           Reason for Appointment: Follow-up for Type 2 Diabetes  Referring physician: Clent Ridges  History of Present Illness:          Date of diagnosis of type 2 diabetes mellitus: 1996       Background history:  He had been on oral hypoglycemic drugs for several years with variable control, prior history is not available in the record Because of poor control in 2012 he was tried on Byetta in addition to his metformin and Amaryl Apparently this was not effective and he was started on insulin in 2013 with Lantus and Humalog, his A1c then was 8.3 He was also ordered an insulin pump by his other endocrinologist about 2 years ago but he could not do this because of visual difficulties He believes that when he was able to take his insulin consistently has blood sugars were usually not above 150 and also he would get some hypoglycemia also  Recent history:   INSULIN regimen is described as:  Novolin N, 27  Units acb and 22-27 hs; 4 R  Before meals    He was seen in consultation in 2016 for persistently poorly controlled diabetes and was taking large doses of Toujeo and Humalog along with Invokamet.   Subsequently because of lack of prescription coverage he stopped taking  insulin and blood sugars were markedly elevated when he was admitted for cellulitis of leg  In 12/16  Subsequently has been on NPH and Regular Insulin because of the low cost of this regimen On his last visit he was told to take regular insulin consistently with every meal He has done well with this regimen and his A1c is now below 7  Current blood sugar patterns and problems identified:  He is still using mostly the Generic monitor and has only 3 readings since his hospital discharge a couple of weeks ago  He things that he is starting to feel hypoglycemic in the morning, mostly in the last couple of days with readings in the 60s although this is not  documented on his current monitor  With taking 22 units instead of 27 his fasting glucose was 115 today  His blood sugars maybe occasionally higher after supper, highest 222 but has only 3 readings  Does not have enough readings to assess his postprandial control after breakfast and lunch   He is still reducing high-fat meals and eating more salads and vegetables  He has lost weight but this may be from fluid changes and also recent hospitalization for sepsis  Oral hypoglycemic drugs the patient is taking are:    None Side effects from medications have been: None  Compliance with the medical regimen: Improved Hypoglycemia:   none for some time  Glucose monitoring:  done 2 times a day         Glucometer:    One Touch and [employer] brand     Blood Glucose readings from download as above:   Self-care: The diet that the patient has been following is: tries to limit fatty foods      Breakfast is oatmeal/ toast with egg usually at 9 AM   Dietician visit, most recent: Years ago               Exercise:  unable to   Weight history:  Wt Readings from Last 3 Encounters:  07/26/15 253 lb (  114.76 kg)  07/24/15 250 lb (113.399 kg)  07/17/15 270 lb (122.471 kg)    Glycemic control:    Lab Results  Component Value Date   HGBA1C 6.8* 07/23/2015   HGBA1C 12.3* 05/06/2015   HGBA1C 11.6* 07/18/2014   Lab Results  Component Value Date   MICROALBUR 365.9* 07/23/2015   LDLCALC 90 07/23/2015   CREATININE 0.72 07/23/2015         Medication List       This list is accurate as of: 07/26/15  1:39 PM.  Always use your most recent med list.               albuterol 108 (90 Base) MCG/ACT inhaler  Commonly known as:  PROVENTIL HFA;VENTOLIN HFA  Inhale 2 puffs into the lungs every 4 (four) hours as needed for wheezing or shortness of breath. PRN:  Allergies/breathing     Alcohol Swabs Pads  Use up to 10 times per day for testing and insulin injections, diagnosis code is 250.00      amLODipine 10 MG tablet  Commonly known as:  NORVASC  Take 1 tablet (10 mg total) by mouth daily.     aspirin 325 MG tablet  Take 325 mg by mouth daily. Reported on 05/29/2015     cloNIDine 0.1 MG tablet  Commonly known as:  CATAPRES  Take 1 tablet (0.1 mg total) by mouth 2 (two) times daily.     diphenhydrAMINE 25 MG tablet  Commonly known as:  BENADRYL  Take 25 mg by mouth every 6 (six) hours as needed for allergies.     doxycycline 100 MG tablet  Commonly known as:  VIBRA-TABS  Take 1 tablet (100 mg total) by mouth 2 (two) times daily.     FLUoxetine 40 MG capsule  Commonly known as:  PROZAC  Take 1 capsule (40 mg total) by mouth daily.     furosemide 40 MG tablet  Commonly known as:  LASIX  Take 1 tablet (40 mg total) by mouth 2 (two) times daily.     gabapentin 300 MG capsule  Commonly known as:  NEURONTIN  Take 1 capsule (300 mg total) by mouth 3 (three) times daily.     guaiFENesin 600 MG 12 hr tablet  Commonly known as:  MUCINEX  Take 600 mg by mouth 2 (two) times daily.     insulin NPH Human 100 UNIT/ML injection  Commonly known as:  HUMULIN N  Inject 0.25 mLs (25 Units total) into the skin 2 (two) times daily before a meal.     insulin regular 100 units/mL injection  Commonly known as:  HUMULIN R  Inject 0.04 mLs (4 Units total) into the skin 3 (three) times daily before meals.     metoprolol succinate 50 MG 24 hr tablet  Commonly known as:  TOPROL-XL  Take 1 tablet (50 mg total) by mouth daily. Take with or immediately following a meal.     Potassium Chloride ER 20 MEQ Tbcr  Commonly known as:  KLOR-CON 10  Take 20 mEq by mouth daily.        Allergies:  Allergies  Allergen Reactions  . Codeine Hives and Nausea And Vomiting    Hyper  . Erythromycin Nausea And Vomiting  . Hibiclens [Chlorhexidine] Itching  . Other     Pre-op cleaning wipes:. itching  . Soap Itching    "dial soap"  . Vancomycin Swelling    Patient reports severe bilateral upper  extremity swelling with vancomycin  Past Medical History  Diagnosis Date  . Hypertension   . Anxiety   . Shingles 2010  . Allergic rhinitis   . Diabetes mellitus     sees Dr. Dorisann Frames   . Retinopathy of both eyes     sees Dr. Luciana Axe   . Neuropathy in diabetes (HCC)   . Cellulitis     legs - hx  . OSA (obstructive sleep apnea)     CPAP- not wearing  . Recurrent upper respiratory infection (URI)     frequent bronchcitis  . H/O hiatal hernia     pt thinks so  . Headache(784.0)     had migraine- not in years  . Retinopathy     right eye  . Asthma   . Chronic kidney disease   . Bladder infection     in June -Was on Cipro  . Heart murmur     Followed by Dr Jens Som  . Hyperlipidemia   . CKD (chronic kidney disease), stage II     Past Surgical History  Procedure Laterality Date  . Tonsillectomy  1983  . Eye surgery  2012    Cataract with lens  Right  . Pars plana vitrectomy w/ scleral buckle  09/01/2011    Procedure: PARS PLANA VITRECTOMY WITH LASER FOR MACULAR HOLE;  Surgeon: Edmon Crape, MD;  Location: [facility] [place] OR;  Service: Ophthalmology;  Laterality: Right;  Pars Plana Vitrectomy with Laser and Membrane Peel; Insertion Silicone Oil  . Pars plana vitrectomy  12/04/2011    Procedure: PARS PLANA VITRECTOMY WITH 25 GAUGE;  Surgeon: Edmon Crape, MD;  Location: [facility] OR;  Service: Ophthalmology;  Laterality: Left;  Insertion Silicon Oil 5000 CS and    . Vitrectomy  2013    per Dr. Luciana Axe    Family History  Problem Relation Age of Onset  . Diabetes Mother   . Transient ischemic attack Father   . Cancer Father   . Anesthesia problems Neg Hx   . Diabetes Maternal Uncle   . Diabetes Maternal Grandmother   . Heart disease Maternal Grandfather     Social History:  reports that he quit smoking about 5 years ago. He has quit using smokeless tobacco. He reports that he does not drink alcohol or use illicit drugs.    Review of Systems    Lipid history: Currently not on  any medications    Lab Results  Component Value Date   CHOL 151 07/23/2015   HDL 37.70* 07/23/2015   LDLCALC 90 07/23/2015   LDLDIRECT 103.0 11/05/2014   TRIG 119.0 07/23/2015   CHOLHDL 4 07/23/2015           Most recent eye exam was 6/15,   Diabetic foot exam in 5/16 showed sensory loss  He has had hypogonadism but not treated because of cost of medications  HYPERTENSION: He has been prescribed clonidine, metoprolol and amlodipine for this He was previously on losartan and not clear why this was stopped   LABS:  Lab on 07/23/2015  Component Date Value Ref Range Status  . Hgb A1c MFr Bld 07/23/2015 6.8* 4.6 - 6.5 % Final   Glycemic Control Guidelines for People with Diabetes:Non Diabetic:  <6%Goal of Therapy: <7%Additional Action Suggested:  >8%   . Sodium 07/23/2015 142  135 - 145 mEq/L Final  . Potassium 07/23/2015 3.9  3.5 - 5.1 mEq/L Final  . Chloride 07/23/2015 105  96 - 112 mEq/L Final  . CO2 07/23/2015 30  19 -  32 mEq/L Final  . Glucose, Bld 07/23/2015 93  70 - 99 mg/dL Final  . BUN 32/44/0102 9  6 - 23 mg/dL Final  . Creatinine, Ser 07/23/2015 0.72  0.40 - 1.50 mg/dL Final  . Calcium 72/53/6644 8.5  8.4 - 10.5 mg/dL Final  . GFR 03/47/4259 125.18  >60.00 mL/min Final  . Microalb, Ur 07/23/2015 365.9* 0.0 - 1.9 mg/dL Final  . Creatinine,U 56/38/7564 96.2   Final  . Microalb Creat Ratio 07/23/2015 380.2* 0.0 - 30.0 mg/g Final  . Cholesterol 07/23/2015 151  0 - 200 mg/dL Final   ATP III Classification       Desirable:  < 200 mg/dL               Borderline High:  200 - 239 mg/dL          High:  > = 332 mg/dL  . Triglycerides 07/23/2015 119.0  0.0 - 149.0 mg/dL Final   Normal:  <951 mg/dLBorderline High:  150 - 199 mg/dL  . HDL 07/23/2015 37.70* >39.00 mg/dL Final  . VLDL 88/41/6606 23.8  0.0 - 40.0 mg/dL Final  . LDL Cholesterol 07/23/2015 90  0 - 99 mg/dL Final  . Total CHOL/HDL Ratio 07/23/2015 4   Final                  Men          Women1/2 Average Risk      3.4          3.3Average Risk          5.0          4.42X Average Risk          9.6          7.13X Average Risk          15.0          11.0                      . NonHDL 07/23/2015 113.64   Final   NOTE:  Non-HDL goal should be 30 mg/dL higher than patient's LDL goal (i.e. LDL goal of < 70 mg/dL, would have non-HDL goal of < 100 mg/dL)    Physical Examination:  BP 154/84 mmHg  Pulse 72  Temp(Src) 98 F (36.7 C)  Resp 14  Ht 6' (1.829 m)  Wt 253 lb (114.76 kg)  BMI 34.31 kg/m2  SpO2 97%          ASSESSMENT:  Diabetes type 2, uncontrolled    He has had poor control in the past with significant insulin requirement but more recently his blood sugars are very well controlled with relatively smaller amounts of insulin This is likely to be from his being very consistent with his diet  Today unable to get a proper evaluation of his blood sugars as he is not using the brand name meter consistently and not clear if he continues to have high readings after his evening meal, has only a couple of sporadic high readings in the afternoon and evening Fasting readings are getting lower and he has on his own reduced his bedtime NPH by 5 units  DIABETIC nephropathy: He has significant proteinuria and not taking losartan at this time Also his blood pressure is not controlled with current regimen  PLAN:   When he has Medicaid coverage he can use a brand name meter that is on the formulary  Meanwhile continue same insulin  doses but increase suppertime regular if postprandial readings are over at least 150  Consider using losartan for his nephropathy, will defer this to his PCP who has prescribed it before  There are no Patient Instructions on file for this visit.       Denessa Cavan 07/26/2015, 1:39 PM   Note: This office note was prepared with Dragon voice recognition system technology. Any transcriptional errors that result from this process are unintentional.

## 2015-07-26 NOTE — Patient Instructions (Addendum)
Check blood sugars on waking up 3  times a week Also check blood sugars about 2 hours after a meal and do this after different meals by rotation  Recommended blood sugar levels on waking up is 90-130 and about 2 hours after meal is 130-160  Please bring your blood sugar monitor to each visit, thank you  Stay on 22 N at bedtime  Check coverage of test strips

## 2015-07-29 NOTE — Progress Notes (Signed)
Subjective:    Patient ID: Alex Ryan, male    DOB: 06/05/1970, 45 y.o.   MRN: [id number]  HPI 45 yo male former smoker with OSA Former pt of Dr. Gwenette Greet  Has DM, CKD , and recurrent Cellulitis   07/24/15 [facility] follow up  Pt returns for a post hospital follow up .  Recent admission x 2 with acute resp failure with HCAP w/ bilateral pleural effusions,  Influenza and viral panel neg.  tx w/ abx and nebs.  Underwent thoracentesis w/ 1.1 pleural fluid removed  w/ neg cytology .  cXR today shows mild L Basilar atx and tny effusion on left.  Since discharge starting to feel better. Decreased dyspnea.  Denies chest pain, orthopnea , fever, or hemoptysis.   Past Medical History  Diagnosis Date  . Hypertension   . Anxiety   . Shingles 2010  . Allergic rhinitis   . Diabetes mellitus     sees Dr. Jacelyn Pi   . Retinopathy of both eyes     sees Dr. Zadie Rhine   . Neuropathy in diabetes ([place])   . Cellulitis     legs - hx  . OSA (obstructive sleep apnea)     CPAP- not wearing  . Recurrent upper respiratory infection (URI)     frequent bronchcitis  . H/O hiatal hernia     pt thinks so  . Headache(784.0)     had migraine- not in years  . Retinopathy     right eye  . Asthma   . Chronic kidney disease   . Bladder infection     in June -Was on Cipro  . Heart murmur     Followed by Dr Stanford Breed  . Hyperlipidemia   . CKD (chronic kidney disease), stage II    Current Outpatient Prescriptions on File Prior to Visit  Medication Sig Dispense Refill  . albuterol (PROVENTIL HFA;VENTOLIN HFA) 108 (90 Base) MCG/ACT inhaler Inhale 2 puffs into the lungs every 4 (four) hours as needed for wheezing or shortness of breath. PRN:  Allergies/breathing 1 Inhaler 5  . Alcohol Swabs PADS Use up to 10 times per day for testing and insulin injections, diagnosis code is 250.00 300 each 3  . amLODipine (NORVASC) 10 MG tablet Take 1 tablet (10 mg total) by mouth daily. 30 tablet 0  . aspirin  325 MG tablet Take 325 mg by mouth daily. Reported on 05/29/2015    . cloNIDine (CATAPRES) 0.1 MG tablet Take 1 tablet (0.1 mg total) by mouth 2 (two) times daily. 180 tablet 0  . diphenhydrAMINE (BENADRYL) 25 MG tablet Take 25 mg by mouth every 6 (six) hours as needed for allergies.     Marland Kitchen doxycycline (VIBRA-TABS) 100 MG tablet Take 1 tablet (100 mg total) by mouth 2 (two) times daily. 60 tablet 0  . FLUoxetine (PROZAC) 40 MG capsule Take 1 capsule (40 mg total) by mouth daily. (Patient taking differently: Take 40 mg by mouth 2 (two) times daily. ) 30 capsule 5  . furosemide (LASIX) 40 MG tablet Take 1 tablet (40 mg total) by mouth 2 (two) times daily. 60 tablet 0  . gabapentin (NEURONTIN) 300 MG capsule Take 1 capsule (300 mg total) by mouth 3 (three) times daily. 90 capsule 0  . guaiFENesin (MUCINEX) 600 MG 12 hr tablet Take 600 mg by mouth 2 (two) times daily.    . insulin NPH Human (HUMULIN N) 100 UNIT/ML injection Inject 0.25 mLs (25 Units total) into the  skin 2 (two) times daily before a meal. (Patient taking differently: Inject 27 Units into the skin 2 (two) times daily before a meal. ) 10 mL 0  . insulin regular (HUMULIN R) 100 units/mL injection Inject 0.04 mLs (4 Units total) into the skin 3 (three) times daily before meals. (Patient taking differently: Inject 4-8 Units into the skin 2 (two) times daily. 8 units in the morning, 4 in the evening) 10 mL 0  . metoprolol succinate (TOPROL-XL) 50 MG 24 hr tablet Take 1 tablet (50 mg total) by mouth daily. Take with or immediately following a meal. (Patient taking differently: Take 50 mg by mouth 2 (two) times daily. Take with or immediately following a meal.) 90 tablet 3  . Potassium Chloride ER 20 MEQ TBCR Take 20 mEq by mouth daily. 30 tablet 0   No current facility-administered medications on file prior to visit.      Review of Systems Constitutional:   No  weight loss, night sweats,  Fevers, chills,  +fatigue, or  lassitude.  HEENT:    No headaches,  Difficulty swallowing,  Tooth/dental problems, or  Sore throat,                No sneezing, itching, ear ache, nasal congestion, post nasal drip,   CV:  No chest pain,  Orthopnea, PND, swelling in lower extremities, anasarca, dizziness, palpitations, syncope.   GI  No heartburn, indigestion, abdominal pain, nausea, vomiting, diarrhea, change in bowel habits, loss of appetite, bloody stools.   Resp:   No chest wall deformity  Skin: no rash or lesions.  GU: no dysuria, change in color of urine, no urgency or frequency.  No flank pain, no hematuria   MS:  No joint pain or swelling.  No decreased range of motion.  No back pain.  Psych:  No change in mood or affect. No depression or anxiety.  No memory loss.         Objective:   Physical Exam  Filed Vitals:   07/24/15 1137  Pulse: 74  Temp: 98 F (36.7 C)  TempSrc: Oral  Height: 5\' 10"  (1.778 m)  Weight: 250 lb (113.399 kg)  SpO2: 98%   GEN: A/Ox3; pleasant , obese   HEENT:  [place]/AT,  EACs-clear, TMs-wnl, NOSE-clear, THROAT-clear, no lesions, no postnasal drip or exudate noted.   NECK:  Supple w/ fair ROM; no JVD; normal carotid impulses w/o bruits; no thyromegaly or nodules palpated; no lymphadenopathy.  RESP  Decreased BS in bases, no accessory muscle use, no dullness to percussion  CARD:  RRR, no m/r/g  , tr -1  peripheral edema, pulses intact, no cyanosis or clubbing.  GI:   Soft & nt; nml bowel sounds; no organomegaly or masses detected.  Musco: Warm bil, no deformities or joint swelling noted.   Neuro: alert, no focal deficits noted.    Skin: Warm, no lesions or rashes  Tammy Parrett NP-C  [facility]  07/24/15       Assessment & Plan:

## 2015-07-30 NOTE — Assessment & Plan Note (Signed)
Clinically improved  cxr is improved  Cont on current regimen

## 2015-07-30 NOTE — Assessment & Plan Note (Addendum)
Recent admission withHCAP and Bilateral Effusion > thoracentesis w/ neg cytology  Improved with no sign reaccumulation on cxr today

## 2015-08-08 MED FILL — AMLODIPINE BESYLATE 5 MG TA: 5 | 90 days supply | Qty: 90 | Fill #2

## 2015-08-12 ENCOUNTER — Other Ambulatory Visit: Payer: Self-pay | Admitting: Family Medicine

## 2015-08-12 MED FILL — METOPROLOL SUCC ER 50 MG TA: 50 | 90 days supply | Qty: 90 | Fill #1

## 2015-08-13 MED FILL — FUROSEMIDE 40 MG TABLET: 40 | 90 days supply | Qty: 180 | Fill #0

## 2015-08-14 ENCOUNTER — Encounter: Payer: Self-pay | Admitting: Family Medicine

## 2015-08-14 ENCOUNTER — Ambulatory Visit (INDEPENDENT_AMBULATORY_CARE_PROVIDER_SITE_OTHER): Payer: Self-pay | Admitting: Family Medicine

## 2015-08-14 VITALS — BP 136/74 | HR 69 | Temp 98.5°F | Ht 72.0 in | Wt 264.0 lb

## 2015-08-14 DIAGNOSIS — I10 Essential (primary) hypertension: Secondary | ICD-10-CM

## 2015-08-14 DIAGNOSIS — Z794 Long term (current) use of insulin: Secondary | ICD-10-CM

## 2015-08-14 DIAGNOSIS — E11319 Type 2 diabetes mellitus with unspecified diabetic retinopathy without macular edema: Secondary | ICD-10-CM

## 2015-08-14 DIAGNOSIS — L03314 Cellulitis of groin: Secondary | ICD-10-CM

## 2015-08-14 MED ORDER — DOXYCYCLINE HYCLATE 100 MG PO TABS: 100.0000 mg | ORAL_TABLET | Freq: Two times a day (BID) | ORAL | Status: DC

## 2015-08-14 MED ORDER — CEPHALEXIN 500 MG PO CAPS: 500.0000 mg | ORAL_CAPSULE | Freq: Every day | ORAL | Status: AC

## 2015-08-14 NOTE — Progress Notes (Signed)
   Subjective:    Patient ID: Alex Ryan, male    DOB: 1970-06-26, 45 y.o.   MRN: [id number]  HPI Here to follow up on cellulitis of the legs and scrotum along with edema. He is doing very well now. He feels better and the fever is gone. The swelling in the feet has improved so that he can now wear shoes and socks comfortably. The scrotal swelling has decreased. No redness or warmth of the skin. He has decided to take a minimal amount of anitbiotics, and this now includes Doxycycline bid and Keflex once a day. He recently saw Dr. Dwyane Dee and his A1c was 6.8.    Review of Systems  Constitutional: Negative.   Respiratory: Negative.   Cardiovascular: Positive for leg swelling. Negative for chest pain and palpitations.  Genitourinary: Positive for scrotal swelling.       Objective:   Physical Exam  Constitutional: He appears well-developed and well-nourished.  Cardiovascular: Normal rate, regular rhythm, normal heart sounds and intact distal pulses.   Pulmonary/Chest: Effort normal and breath sounds normal.  Musculoskeletal:  2+ edema in both lower legs and in the scrotum  Skin:  No erythema or warmth          Assessment & Plan:  His edema and cellulitis are fairly well controlled now. We will maintain his Lasix to BID. We will stay on prophylactic antibiotics by taking one Doxycycline and one Keflex daily for 6 months or so.

## 2015-08-14 NOTE — Progress Notes (Signed)
Pre visit review using our clinic review tool, if applicable. No additional management support is needed unless otherwise documented below in the visit note. 

## 2015-09-03 ENCOUNTER — Ambulatory Visit: Payer: Self-pay | Admitting: Family Medicine

## 2015-09-04 ENCOUNTER — Encounter: Payer: Self-pay | Admitting: Family Medicine

## 2015-09-04 ENCOUNTER — Ambulatory Visit (INDEPENDENT_AMBULATORY_CARE_PROVIDER_SITE_OTHER): Payer: Self-pay | Admitting: Family Medicine

## 2015-09-04 VITALS — BP 174/82 | HR 71 | Temp 98.7°F | Ht 72.0 in | Wt 271.0 lb

## 2015-09-04 DIAGNOSIS — L989 Disorder of the skin and subcutaneous tissue, unspecified: Secondary | ICD-10-CM

## 2015-09-04 NOTE — Progress Notes (Signed)
Pre visit review using our clinic review tool, if applicable. No additional management support is needed unless otherwise documented below in the visit note. 

## 2015-09-05 ENCOUNTER — Encounter: Payer: Self-pay | Admitting: Family Medicine

## 2015-09-05 NOTE — Progress Notes (Signed)
   Subjective:    Patient ID: Alex Ryan, male    DOB: April 12, 1971, 45 y.o.   MRN: [id number]  HPI Here to remove an irritated lesion from the right upper back.    Review of Systems  Constitutional: Negative.        Objective:   Physical Exam  Constitutional: He appears well-developed and well-nourished.  Skin:  Inflamed pedunculated 0.7 cm lesion on right upper back           Assessment & Plan:  The area was cleansed with betadine and LA was achieved using 2% Lidocaine with epinephrine. An elliptical incision was made around the lesion down to the subcutaneous fat layer. The lesion was sent to Pathology. The wound was closed with 3 sutures of 4-0 Ethilon. Dressed with Neosporin and a bandaid. The sutures may be removed in 7 days  Laurey Morale, MD

## 2015-09-06 ENCOUNTER — Ambulatory Visit: Payer: Self-pay | Admitting: Pulmonary Disease

## 2015-09-17 ENCOUNTER — Ambulatory Visit (INDEPENDENT_AMBULATORY_CARE_PROVIDER_SITE_OTHER): Payer: Self-pay | Admitting: Family Medicine

## 2015-09-17 ENCOUNTER — Encounter: Payer: Self-pay | Admitting: Family Medicine

## 2015-09-17 VITALS — BP 158/84 | HR 76 | Temp 98.2°F | Ht 72.0 in | Wt 264.0 lb

## 2015-09-17 DIAGNOSIS — L03119 Cellulitis of unspecified part of limb: Secondary | ICD-10-CM

## 2015-09-17 MED ORDER — DOXYCYCLINE HYCLATE 100 MG PO TABS: 100.0000 mg | ORAL_TABLET | Freq: Two times a day (BID) | ORAL | Status: DC

## 2015-09-17 MED ORDER — LOSARTAN POTASSIUM 50 MG PO TABS: 50.0000 mg | ORAL_TABLET | Freq: Every day | ORAL | Status: DC

## 2015-09-17 MED FILL — LOSARTAN POTASSIUM 50 MG TA: 50 | 30 days supply | Qty: 30 | Fill #0

## 2015-09-17 MED FILL — DOXYCYCLINE HYCLATE 100 MG: 100 | 30 days supply | Qty: 60 | Fill #0

## 2015-09-17 NOTE — Progress Notes (Signed)
Pre visit review using our clinic review tool, if applicable. No additional management support is needed unless otherwise documented below in the visit note. 

## 2015-09-17 NOTE — Progress Notes (Signed)
   Subjective:    Patient ID: Alex Ryan, male    DOB: 08-03-1970, 45 y.o.   MRN: [id number]  HPI Here to remove sutures placed on 09-04-15 when we removed a lesion from his upper back. Pathology revealed this to be a hemangioma. The wound is healing and he has no complaints. However his legs have been turning a pink color and are mildly painful, so he is worried the cellulitis is coming back. No fevers. He is already taking Keflex.   Review of Systems  Constitutional: Negative.   Respiratory: Negative.   Cardiovascular: Negative.   Skin: Positive for color change and wound.       Objective:   Physical Exam  Constitutional: He is oriented to person, place, and time. He appears well-developed and well-nourished.  Cardiovascular: Normal rate, regular rhythm, normal heart sounds and intact distal pulses.   Pulmonary/Chest: Effort normal and breath sounds normal.  Neurological: He is alert and oriented to person, place, and time.  Skin:  The surgical site looks clean and is healing well. Both lower legs are swollen, tender, and slightly erythematous. Not warm          Assessment & Plan:  The sutures were removed from the surgical site. His cellulitis is coming back so he will add Doxycycline to the keflex.  Laurey Morale, MD

## 2015-09-18 ENCOUNTER — Ambulatory Visit: Payer: Self-pay | Admitting: Family Medicine

## 2015-09-24 ENCOUNTER — Other Ambulatory Visit: Payer: Self-pay

## 2015-09-27 ENCOUNTER — Encounter: Payer: Self-pay | Admitting: *Deleted

## 2015-09-27 ENCOUNTER — Ambulatory Visit: Payer: Self-pay | Admitting: Endocrinology

## 2015-09-27 DIAGNOSIS — Z0289 Encounter for other administrative examinations: Secondary | ICD-10-CM

## 2015-10-10 ENCOUNTER — Ambulatory Visit: Payer: Self-pay | Admitting: Pulmonary Disease

## 2015-10-16 ENCOUNTER — Other Ambulatory Visit: Payer: Self-pay | Admitting: Family Medicine

## 2015-10-16 NOTE — Telephone Encounter (Signed)
Can we refill these and clarify correct dose on Metoprolol?

## 2015-10-17 ENCOUNTER — Ambulatory Visit (INDEPENDENT_AMBULATORY_CARE_PROVIDER_SITE_OTHER): Payer: Self-pay | Admitting: Pulmonary Disease

## 2015-10-17 ENCOUNTER — Encounter: Payer: Self-pay | Admitting: Pulmonary Disease

## 2015-10-17 VITALS — BP 134/78 | HR 74 | Ht 72.0 in | Wt 277.8 lb

## 2015-10-17 DIAGNOSIS — J189 Pneumonia, unspecified organism: Secondary | ICD-10-CM

## 2015-10-17 DIAGNOSIS — J45909 Unspecified asthma, uncomplicated: Secondary | ICD-10-CM

## 2015-10-17 DIAGNOSIS — J90 Pleural effusion, not elsewhere classified: Secondary | ICD-10-CM

## 2015-10-17 DIAGNOSIS — G4733 Obstructive sleep apnea (adult) (pediatric): Secondary | ICD-10-CM

## 2015-10-17 MED FILL — cloNIDine HCL 0.1 MG TABS: 0.1 | 90 days supply | Qty: 180 | Fill #0

## 2015-10-17 NOTE — Patient Instructions (Addendum)
Nice to meet you today Add flonase 2 puffs once daily for seasonal allergies. Continue your current medical regimen Follow up as needed for a dry cough or shortness of breath. Please contact office for sooner follow up if symptoms do not improve or worsen or seek emergency care

## 2015-10-17 NOTE — Assessment & Plan Note (Signed)
Recent admission withHCAP and Bilateral Effusion > thoracentesis w/ neg cytology  Improved with no sign reaccumulation on cxr today  Denies a dry cough Plan: Continue monitoring fluid retention Weigh daily and follow instructions for diuretic per Dr. Sarajane Jews.

## 2015-10-17 NOTE — Assessment & Plan Note (Signed)
Pt. Has just received a new nasal pillow  Will begin wearing it tonight. Plan: Continue on CPAP at bedtime. You appear to be benefiting from the treatment Goal is to wear for at least 4-6 hours each night for maximal clinical benefit. Continue to work on weight loss, as the link between excess weight  and sleep apnea is well established.  Do not drive if sleepy. Follow up as needed

## 2015-10-17 NOTE — Assessment & Plan Note (Signed)
Continued clinical improvement Has not needed rescue inhaler. Plan: Continue current treatment regimen Follow up as needed Please contact office for sooner follow up if symptoms do not improve or worsen or seek emergency care

## 2015-10-17 NOTE — Assessment & Plan Note (Signed)
No PFT's to support diagnosis No current maintenance medications No exacerbations Plan: Continue rescue inhaler for wheezing and shortness of breath Follow up as needed.

## 2015-10-17 NOTE — Progress Notes (Signed)
Subjective:    Patient ID: Alex Ryan, male    DOB: 1970/06/19, 45 y.o.   MRN: [id number]  Synopsis: First seen while hospitalized at [facility] in 2017 for pleural effusion in the setting of congestive heart failure exacerbation. Has baseline history of asthma and OSA, wears CPAP.  HPI Chief Complaint  Patient presents with  . Follow-up    pt states he is doing well- tolerating cpap well.  Does note a nonprod cough X2-3 days.     10/17/2015: Follow up OV. Pt. States he is doing " great" He states he does have some issues with fluid retention in his thighs and abdomen. Marland Kitchen He was taken off the keflex. He is currently taking Doxycycline 100 mg daily. This is a decrease on the dose from 200 mg daily.He does state that he has a moist cough that he feels is due to allergies. He states he has no shortness of breath. He is compliant with his CPAP, and just got a new nasal pillows for it, will start this tonight.  Past Medical History  Diagnosis Date  . Hypertension   . Anxiety   . Shingles 2010  . Allergic rhinitis   . Diabetes mellitus     sees Dr. Jacelyn Pi   . Retinopathy of both eyes     sees Dr. Zadie Rhine   . Neuropathy in diabetes ([place])   . Cellulitis     legs - hx  . OSA (obstructive sleep apnea)     CPAP- not wearing  . Recurrent upper respiratory infection (URI)     frequent bronchcitis  . H/O hiatal hernia     pt thinks so  . Headache(784.0)     had migraine- not in years  . Retinopathy     right eye  . Asthma   . Chronic kidney disease   . Bladder infection     in June -Was on Cipro  . Heart murmur     Followed by Dr Stanford Breed  . Hyperlipidemia   . CKD (chronic kidney disease), stage II       Review of Systems   Constitutional:   No  weight loss, night sweats,  Fevers, chills, fatigue, or  lassitude.  HEENT:   No headaches,  Difficulty swallowing,  Tooth/dental problems, or  Sore throat,                No sneezing, itching, ear ache, ++nasal  congestion, ++post nasal drip,   CV:  No chest pain,  Orthopnea, PND,  ++swelling in lower extremities, slight anasarca, no dizziness, palpitations, syncope.   GI  No heartburn, + indigestion, abdominal pain, nausea, vomiting, diarrhea, change in bowel habits, loss of appetite, bloody stools.   Resp: No  shortness of breath with exertion or at rest.  No excess mucus, no productive cough,  No non-productive cough,  No coughing up of blood.  No change in color of mucus.  No wheezing.  No chest wall deformity  Skin: no rash or lesions.  GU: no dysuria, change in color of urine, no urgency or frequency.  No flank pain, no hematuria   MS:  No joint pain or swelling.  No decreased range of motion.  No back pain.  Psych:  No change in mood or affect. No depression or anxiety.  No memory loss.         Objective:   Physical Exam  Physical Exam:  General- No distress,  A&Ox3, pleasant ENT: No  sinus tenderness, TM clear, pale nasal mucosa, no oral exudate,+ post nasal drip, no LAN Cardiac: S1, S2, regular rate and rhythm, no murmur Chest: No wheeze/ rales/ dullness; no accessory muscle use, no nasal flaring, no sternal retractions Abd.: Soft Non-tender Ext: No clubbing cyanosis, ++edema in upper thighs and abdomen. Neuro:  normal strength Skin: No rashes, warm and dry Psych: normal mood and behavior  Filed Vitals:   10/17/15 1346  BP: 134/78  Pulse: 74  Height: 6' (1.829 m)  Weight: 277 lb 12.8 oz (126.009 kg)  SpO2: 99%          Assessment & Plan:  Pleural effusion Recent admission withHCAP and Bilateral Effusion > thoracentesis w/ neg cytology  Improved with no sign reaccumulation on cxr today  Denies a dry cough Plan: Continue monitoring fluid retention Weigh daily and follow instructions for diuretic per Dr. Sarajane Jews.     HCAP (healthcare-associated pneumonia) Continued clinical improvement Has not needed rescue inhaler. Plan: Continue current treatment  regimen Follow up as needed Please contact office for sooner follow up if symptoms do not improve or worsen or seek emergency care   Asthma No PFT's to support diagnosis No current maintenance medications No exacerbations Plan: Continue rescue inhaler for wheezing and shortness of breath Follow up as needed.   OSA (obstructive sleep apnea) Pt. Has just received a new nasal pillow  Will begin wearing it tonight. Plan: Continue on CPAP at bedtime. You appear to be benefiting from the treatment Goal is to wear for at least 4-6 hours each night for maximal clinical benefit. Continue to work on weight loss, as the link between excess weight  and sleep apnea is well established.  Do not drive if sleepy. Follow up as needed      Current outpatient prescriptions:  .  albuterol (PROVENTIL HFA;VENTOLIN HFA) 108 (90 Base) MCG/ACT inhaler, Inhale 2 puffs into the lungs every 4 (four) hours as needed for wheezing or shortness of breath. PRN:  Allergies/breathing, Disp: 1 Inhaler, Rfl: 5 .  Alcohol Swabs PADS, Use up to 10 times per day for testing and insulin injections, diagnosis code is 250.00, Disp: 300 each, Rfl: 3 .  aspirin 325 MG tablet, Take 325 mg by mouth daily. Reported on 05/29/2015, Disp: , Rfl:  .  cloNIDine (CATAPRES) 0.1 MG tablet, Take 1 tablet (0.1 mg total) by mouth 2 (two) times daily., Disp: 180 tablet, Rfl: 0 .  doxycycline (VIBRA-TABS) 100 MG tablet, Take 1 tablet (100 mg total) by mouth 2 (two) times daily. (Patient taking differently: Take 100 mg by mouth daily. ), Disp: 60 tablet, Rfl: 5 .  FLUoxetine (PROZAC) 40 MG capsule, Take 1 capsule (40 mg total) by mouth daily., Disp: 30 capsule, Rfl: 5 .  furosemide (LASIX) 40 MG tablet, Take 1 tablet (40 mg total) by mouth 2 (two) times daily., Disp: 60 tablet, Rfl: 0 .  gabapentin (NEURONTIN) 300 MG capsule, Take 1 capsule (300 mg total) by mouth 3 (three) times daily., Disp: 90 capsule, Rfl: 0 .  guaiFENesin (MUCINEX) 600  MG 12 hr tablet, Take 600 mg by mouth 2 (two) times daily. Reported on 09/17/2015, Disp: , Rfl:  .  insulin NPH Human (HUMULIN N) 100 UNIT/ML injection, Inject 0.25 mLs (25 Units total) into the skin 2 (two) times daily before a meal. (Patient taking differently: Inject 32 Units into the skin 2 (two) times daily before a meal. ), Disp: 10 mL, Rfl: 0 .  insulin regular (HUMULIN R) 100 units/mL  injection, Inject 0.04 mLs (4 Units total) into the skin 3 (three) times daily before meals. (Patient taking differently: Inject into the skin 2 (two) times daily. 8 units in the morning, 4 in the evening), Disp: 10 mL, Rfl: 0 .  loratadine (CLARITIN) 10 MG tablet, Take 10 mg by mouth daily., Disp: , Rfl:  .  losartan (COZAAR) 50 MG tablet, Take 1 tablet (50 mg total) by mouth daily., Disp: 30 tablet, Rfl: 5 .  metoprolol succinate (TOPROL-XL) 50 MG 24 hr tablet, Take 1 tablet (50 mg total) by mouth daily. Take with or immediately following a meal. (Patient taking differently: Take 50 mg by mouth 2 (two) times daily. Take with or immediately following a meal.), Disp: 90 tablet, Rfl: 3 .  Potassium Chloride ER 20 MEQ TBCR, Take 20 mEq by mouth daily. (Patient taking differently: Take 10 mEq by mouth daily. ), Disp: 30 tablet, Rfl: 0   Attending:  I have seen and examined the patient with Eric Form and I agree with the findings from her note.  Mr. Kowitz is feeling great and he looks great today  On exam his lungs are clear to auscultation Minimal swelling in his legs today compared to when I previously saw  Impression/plan Pleural effusion resolved, no further imaging necessary, believe this is due to volume overload Asthma: Crushable diagnosis, appears to be stable Obstructive sleep apnea: Starting CPAP tonight, follow up with [place] if he has problems with this  He will follow-up with [place] on an as-needed basis  Roselie Awkward, MD [facility] Pager: [phone] Cell: [phone] After 3pm or if no  response, call [phone]

## 2015-10-22 ENCOUNTER — Other Ambulatory Visit: Payer: Self-pay | Admitting: Family Medicine

## 2015-10-22 NOTE — Telephone Encounter (Signed)
°  The patient is out of his metoprolol succinate (TOPROL-XL) 50 MG 24 hr tablet. The pharmacy won't fill it with the instructions saying to take once a day, so it needs to be changed to take twice a day. Alex Ryan needs a new Rx

## 2015-10-23 ENCOUNTER — Other Ambulatory Visit: Payer: Self-pay

## 2015-10-23 MED ORDER — METOPROLOL SUCCINATE ER 50 MG PO TB24: 50.0000 mg | ORAL_TABLET | Freq: Two times a day (BID) | ORAL | Status: DC

## 2015-10-23 MED FILL — METOPROLOL SUCC ER 50 MG TA: 50 | 90 days supply | Qty: 180 | Fill #0

## 2015-10-23 NOTE — Telephone Encounter (Signed)
Change this to Metoprolol succinate 50 mg bid. Call in #[phone] with 3 rf

## 2015-10-23 NOTE — Telephone Encounter (Signed)
Okay for patient to take BID? Please advice, thanks.

## 2015-10-23 NOTE — Telephone Encounter (Signed)
Sent to pharmacy 

## 2015-10-28 ENCOUNTER — Encounter: Payer: Self-pay | Admitting: Family Medicine

## 2015-10-28 ENCOUNTER — Ambulatory Visit (INDEPENDENT_AMBULATORY_CARE_PROVIDER_SITE_OTHER): Payer: Self-pay | Admitting: Family Medicine

## 2015-10-28 VITALS — BP 176/92 | HR 94 | Temp 98.0°F | Resp 20 | Ht 72.0 in | Wt 277.0 lb

## 2015-10-28 DIAGNOSIS — I10 Essential (primary) hypertension: Secondary | ICD-10-CM

## 2015-10-28 DIAGNOSIS — Z8709 Personal history of other diseases of the respiratory system: Secondary | ICD-10-CM

## 2015-10-28 DIAGNOSIS — J209 Acute bronchitis, unspecified: Secondary | ICD-10-CM

## 2015-10-28 MED ORDER — PREDNISONE 20 MG PO TABS: 20.0000 mg | ORAL_TABLET | Freq: Two times a day (BID) | ORAL | Status: DC

## 2015-10-28 MED FILL — predniSONE 20 MG TABS: 20 | 7 days supply | Qty: 14 | Fill #0

## 2015-10-28 NOTE — Progress Notes (Signed)
Subjective:    Patient ID: Alex Ryan, male    DOB: 08-03-1970, 45 y.o.   MRN: [id number]  HPI  Alex Ryan is a 45 year old male who presents today with an episode of coughing and wheezing this morning at 2 am.. Reports using rescue inhaler  2 puffs at 2am followed with another 2 puffs 2 hours later which provided moderate benefit. He reports the last use of his inhaler prior to this episode was approximately 4 months ago when he was diagnosed with CAP and he has not used it since that time. Associated symptoms of cough, wheezing, nasal congestion, postnasal drip, rhinorrhea, and sneezing.Marland Kitchen He denies SOB or DOE.  Additional history of cellulitis with sepsis in 04/2015. He was hospitalized again in 06/2015 due to cellulitis and has remained on doxycycline since 09/17/2015 secondary to cellulitis. He was also  seen 10/17/2015 by pulmonology for a follow up after hospitalization in 2017 for pleural effusion in the setting of congestive heart failure exacerbation. He currently uses CPAP due to history of OSA.    Reports monitoring his BP at home and is concerned that his BP is elevated today. Retake of BP is noted as 182/92 and 176/92. He reports not taking any of his medication prior to this appointment and he is currently on a multimedication regimen for BP. He is adherent to his medication regimen but states he was in a hurry for this appointment and failed to take medications. He denies any chest pain, palpitations, headaches, nosebleeds, numbness, tingling, and SOB.   Review of Systems  Constitutional: Negative for fever, chills and fatigue.  HENT: Positive for congestion, postnasal drip, rhinorrhea and sneezing.   Eyes: Negative for visual disturbance.  Respiratory: Positive for cough and wheezing. Negative for stridor.   Cardiovascular: Negative for chest pain and palpitations.  Gastrointestinal: Negative for nausea, vomiting, abdominal pain, diarrhea and constipation.  Genitourinary: Negative  for dysuria, urgency, frequency and hematuria.  Musculoskeletal: Negative for joint swelling and arthralgias.  Skin: Negative for rash.  Allergic/Immunologic: Positive for environmental allergies.  Neurological: Negative for dizziness, light-headedness and headaches.  Psychiatric/Behavioral:       Denies depressed or anxious mood   Past Medical History  Diagnosis Date  . Hypertension   . Anxiety   . Shingles 2010  . Allergic rhinitis   . Diabetes mellitus     sees Dr. Jacelyn Pi   . Retinopathy of both eyes     sees Dr. Zadie Rhine   . Neuropathy in diabetes ([place])   . Cellulitis     legs - hx  . OSA (obstructive sleep apnea)     CPAP- not wearing  . Recurrent upper respiratory infection (URI)     frequent bronchcitis  . H/O hiatal hernia     pt thinks so  . Headache(784.0)     had migraine- not in years  . Retinopathy     right eye  . Asthma   . Chronic kidney disease   . Bladder infection     in June -Was on Cipro  . Heart murmur     Followed by Dr Stanford Breed  . Hyperlipidemia   . CKD (chronic kidney disease), stage II      Social History   Social History  . Marital Status: Married    Spouse Name: N/A  . Number of Children: Y  . Years of Education: N/A   Occupational History  . caterer    Social History Main Topics  .  Smoking status: Former Smoker -- 0.30 packs/day for 29 years    Quit date: 08/16/2009  . Smokeless tobacco: Former Systems developer     Comment: started at age 8.  smoked to 67 or 6 cigs a day.   . Alcohol Use: No  . Drug Use: No  . Sexual Activity: Not on file   Other Topics Concern  . Not on file   Social History Narrative    Past Surgical History  Procedure Laterality Date  . Tonsillectomy  1983  . Eye surgery  2012    Cataract with lens  Right  . 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 Pars plana vitrectomy  12/04/2011    Procedure: PARS PLANA VITRECTOMY WITH 25 GAUGE;  Surgeon: Hurman Horn, MD;  Location: [facility];  Service: Ophthalmology;  Laterality: Left;  Insertion Silicon Oil [id number] CS and    . Vitrectomy  2013    per Dr. Zadie Rhine    Family History  Problem Relation Age of Onset  . Diabetes Mother   . Transient ischemic attack Father   . Cancer Father   . Anesthesia problems Neg Hx   . Diabetes Maternal Uncle   . Diabetes Maternal Grandmother   . Heart disease Maternal Grandfather     Allergies  Allergen Reactions  . Codeine Hives and Nausea And Vomiting    Hyper  . Erythromycin Nausea And Vomiting  . Hibiclens [Chlorhexidine] Itching  . Other     Pre-op cleaning wipes:. itching  . Soap Itching    "dial soap"  . Vancomycin Swelling    Patient reports severe bilateral upper extremity swelling with vancomycin  . Keflex [Cephalexin] Rash    Current Outpatient Prescriptions on File Prior to Visit  Medication Sig Dispense Refill  . albuterol (PROVENTIL HFA;VENTOLIN HFA) 108 (90 Base) MCG/ACT inhaler Inhale 2 puffs into the lungs every 4 (four) hours as needed for wheezing or shortness of breath. PRN:  Allergies/breathing 1 Inhaler 5  . Alcohol Swabs PADS Use up to 10 times per day for testing and insulin injections, diagnosis code is 250.00 300 each 3  . aspirin 325 MG tablet Take 325 mg by mouth daily. Reported on 05/29/2015    . cloNIDine (CATAPRES) 0.1 MG tablet TAKE 1 TABLET BY MOUTH TWICE DAILY 180 tablet 3  . doxycycline (VIBRA-TABS) 100 MG tablet Take 1 tablet (100 mg total) by mouth 2 (two) times daily. (Patient taking differently: Take 100 mg by mouth daily. ) 60 tablet 5  . FLUoxetine (PROZAC) 40 MG capsule Take 1 capsule (40 mg total) by mouth daily. 30 capsule 5  . furosemide (LASIX) 40 MG tablet Take 1 tablet (40 mg total) by mouth 2 (two) times daily.  60 tablet 0  . gabapentin (NEURONTIN) 300 MG capsule Take 1 capsule (300 mg total) by mouth 3 (three) times daily. 90 capsule 0  . guaiFENesin (MUCINEX) 600 MG 12 hr tablet Take 600 mg by mouth 2 (two) times daily. Reported on 09/17/2015    . insulin NPH Human (HUMULIN N) 100 UNIT/ML injection Inject 0.25 mLs (25 Units total) into the skin 2 (two) times daily before a meal. (Patient taking differently: Inject 32 Units into the skin 2 (two) times daily before a  meal. ) 10 mL 0  . insulin regular (HUMULIN R) 100 units/mL injection Inject 0.04 mLs (4 Units total) into the skin 3 (three) times daily before meals. (Patient taking differently: Inject into the skin 2 (two) times daily. 8 units in the morning, 4 in the evening) 10 mL 0  . loratadine (CLARITIN) 10 MG tablet Take 10 mg by mouth daily.    Marland Kitchen losartan (COZAAR) 50 MG tablet Take 1 tablet (50 mg total) by mouth daily. 30 tablet 5  . metoprolol succinate (TOPROL XL) 50 MG 24 hr tablet Take 1 tablet (50 mg total) by mouth 2 (two) times daily. Take with or immediately following a meal. 180 tablet 3  . metoprolol succinate (TOPROL-XL) 50 MG 24 hr tablet TAKE 1 TABLET BY MOUTH DAILY. TAKE WITH A MEAL OR IMMEDIATELY FOLLOWING A MEAL 90 tablet 3  . Potassium Chloride ER 20 MEQ TBCR Take 20 mEq by mouth daily. (Patient taking differently: Take 10 mEq by mouth daily. ) 30 tablet 0   No current facility-administered medications on file prior to visit.    BP 182/92 mmHg  Pulse 94  Temp(Src) 98 F (36.7 C) (Oral)  Resp 20  Ht 6' (1.829 m)  Wt 277 lb (125.646 kg)  BMI 37.56 kg/m2  SpO2 95%       Objective:   Physical Exam  Constitutional: He is oriented to person, place, and time. He appears well-developed and well-nourished.  HENT:  Right Ear: Tympanic membrane normal.  Left Ear: Tympanic membrane normal.  Nose: Rhinorrhea present. Right sinus exhibits no maxillary sinus tenderness and no frontal sinus tenderness. Left sinus exhibits no  maxillary sinus tenderness and no frontal sinus tenderness.  Mouth/Throat: Mucous membranes are normal. No oropharyngeal exudate or posterior oropharyngeal erythema.  Eyes: Pupils are equal, round, and reactive to light. No scleral icterus.  Neck: Neck supple.  Cardiovascular: Normal rate and regular rhythm.   Pulmonary/Chest: Effort normal. He has no rales.  Scattered minimal wheezing noted.   Abdominal: Soft. Bowel sounds are normal.  Musculoskeletal:  +1 edema noted in ankles bilaterally  Lymphadenopathy:    He has no cervical adenopathy.  Neurological: He is alert and oriented to person, place, and time.  Skin: Skin is warm and dry. No rash noted.  Psychiatric:  Denies depressed or anxious mood       Assessment & Plan:  1. Acute bronchitis, unspecified organism Exam and history support acute bronchitis. Discussed with patient causes of bronchitis and symptoms that require medical attention. Due to patient's history, prednisone taper will be instituted and advised patient to continue taking doxycycline as directed by pulmonology.  Further advised patient to follow up with his PCP in one week or sooner if symptoms do not improve, worsen, or he develops a fever >101 due to patient history.  - predniSONE (DELTASONE) 20 MG tablet; Take 1 tablet (20 mg total) by mouth 2 (two) times daily with a meal.  Dispense: 14 tablet; Refill: 0   2. Essential hypertension Discussed current treatment regimen with collaborating physician. Advised patient to take medications as prescribed when he returns home and continue monitoring BP. Provided BP parameters to report and symptoms which require medical attention. Further advised follow up with his PCP to evaluate BP and symptoms of bronchitis.  Patient voiced understanding and agreed with plan.  3. History of asthma  Delano Metz, FNP-C

## 2015-10-28 NOTE — Patient Instructions (Signed)
Please take prednisone as directed and follow up with Dr. Sarajane Jews in one week for assessment of symptoms or sooner if needed if symptoms worsen, do not improve, or you develop a fever >101.  Acute Bronchitis Bronchitis is inflammation of the airways that extend from the windpipe into the lungs (bronchi). The inflammation often causes mucus to develop. This leads to a cough, which is the most common symptom of bronchitis.  In acute bronchitis, the condition usually develops suddenly and goes away over time, usually in a couple weeks. Smoking, allergies, and asthma can make bronchitis worse. Repeated episodes of bronchitis may cause further lung problems.  CAUSES Acute bronchitis is most often caused by the same virus that causes a cold. The virus can spread from person to person (contagious) through coughing, sneezing, and touching contaminated objects. SIGNS AND SYMPTOMS   Cough.   Fever.   Coughing up mucus.   Body aches.   Chest congestion.   Chills.   Shortness of breath.   Sore throat.  DIAGNOSIS  Acute bronchitis is usually diagnosed through a physical exam. Your health care provider will also ask you questions about your medical history. Tests, such as chest X-rays, are sometimes done to rule out other conditions.  TREATMENT  Acute bronchitis usually goes away in a couple weeks. Oftentimes, no medical treatment is necessary. Medicines are sometimes given for relief of fever or cough. Antibiotic medicines are usually not needed but may be prescribed in certain situations. In some cases, an inhaler may be recommended to help reduce shortness of breath and control the cough. A cool mist vaporizer may also be used to help thin bronchial secretions and make it easier to clear the chest.  HOME CARE INSTRUCTIONS  Get plenty of rest.   Drink enough fluids to keep your urine clear or pale yellow (unless you have a medical condition that requires fluid restriction). Increasing fluids  may help thin your respiratory secretions (sputum) and reduce chest congestion, and it will prevent dehydration.   Take medicines only as directed by your health care provider.  If you were prescribed an antibiotic medicine, finish it all even if you start to feel better.  Avoid smoking and secondhand smoke. Exposure to cigarette smoke or irritating chemicals will make bronchitis worse. If you are a smoker, consider using nicotine gum or skin patches to help control withdrawal symptoms. Quitting smoking will help your lungs heal faster.   Reduce the chances of another bout of acute bronchitis by washing your hands frequently, avoiding people with cold symptoms, and trying not to touch your hands to your mouth, nose, or eyes.   Keep all follow-up visits as directed by your health care provider.  SEEK MEDICAL CARE IF: Your symptoms do not improve after 1 week of treatment.  SEEK IMMEDIATE MEDICAL CARE IF:  You develop an increased fever or chills.   You have chest pain.   You have severe shortness of breath.  You have bloody sputum.   You develop dehydration.  You faint or repeatedly feel like you are going to pass out.  You develop repeated vomiting.  You develop a severe headache. MAKE SURE YOU:   Understand these instructions.  Will watch your condition.  Will get help right away if you are not doing well or get worse.   This information is not intended to replace advice given to you by your health care provider. Make sure you discuss any questions you have with your health care provider.  Document Released: 06/11/2004 Document Revised: 05/25/2014 Document Reviewed: 10/25/2012 Elsevier Interactive Patient Education [employer].

## 2015-10-30 MED FILL — LOSARTAN POTASSIUM 50 MG TA: 50 | 60 days supply | Qty: 60 | Fill #1

## 2015-11-04 ENCOUNTER — Encounter: Payer: Self-pay | Admitting: Family Medicine

## 2015-11-04 ENCOUNTER — Ambulatory Visit (INDEPENDENT_AMBULATORY_CARE_PROVIDER_SITE_OTHER): Payer: Medicaid Other | Admitting: Family Medicine

## 2015-11-04 VITALS — BP 185/87 | HR 73 | Temp 98.2°F | Ht 72.0 in | Wt 271.0 lb

## 2015-11-04 DIAGNOSIS — J209 Acute bronchitis, unspecified: Secondary | ICD-10-CM

## 2015-11-04 DIAGNOSIS — I10 Essential (primary) hypertension: Secondary | ICD-10-CM

## 2015-11-04 DIAGNOSIS — J45909 Unspecified asthma, uncomplicated: Secondary | ICD-10-CM

## 2015-11-04 MED ORDER — CLONIDINE HCL 0.2 MG PO TABS: 0.2000 mg | ORAL_TABLET | Freq: Two times a day (BID) | ORAL | Status: DC

## 2015-11-04 MED ORDER — FLUTICASONE PROPIONATE HFA 220 MCG/ACT IN AERO: 2.0000 | INHALATION_SPRAY | Freq: Two times a day (BID) | RESPIRATORY_TRACT | Status: DC

## 2015-11-04 MED ORDER — LOSARTAN POTASSIUM 100 MG PO TABS: 100.0000 mg | ORAL_TABLET | Freq: Every day | ORAL | Status: AC

## 2015-11-04 MED ORDER — AMLODIPINE BESYLATE 5 MG PO TABS: 5.0000 mg | ORAL_TABLET | Freq: Every day | ORAL | Status: DC

## 2015-11-04 NOTE — Progress Notes (Signed)
Pre visit review using our clinic review tool, if applicable. No additional management support is needed unless otherwise documented below in the visit note. 

## 2015-11-04 NOTE — Progress Notes (Signed)
   Subjective:    Patient ID: Alex Ryan, male    DOB: 12-18-1970, 45 y.o.   MRN: [id number]  HPI Here to follow up on bronchitis, asthma, and HTN. He was here a week ago for a bronchitis and an asthma exacerbation. He was given a week of Prednisone 20 mg bid which he finished last night. He also took a week of Doxycycline. Now he feels better and is breathing more easily, but he is still coughing up clear sputum. His BP remains high in the [id number] or A999333 systolic. He is using his albuterol inhaler 2-3 times a day, and he says the summer heat really bothers him.    Review of Systems  Constitutional: Negative.   Respiratory: Positive for cough, chest tightness, shortness of breath and wheezing.   Cardiovascular: Positive for leg swelling. Negative for chest pain and palpitations.  Neurological: Negative.        Objective:   Physical Exam  Constitutional: He is oriented to person, place, and time. He appears well-developed and well-nourished. No distress.  Neck: No thyromegaly present.  Cardiovascular: Normal rate, regular rhythm, normal heart sounds and intact distal pulses.   Pulmonary/Chest: Effort normal and breath sounds normal. No respiratory distress. He has no wheezes. He has no rales.  Musculoskeletal:  3+ edema in both lower legs, no warmth or erythema   Lymphadenopathy:    He has no cervical adenopathy.  Neurological: He is alert and oriented to person, place, and time.          Assessment & Plan:  His bronchitis has resolved. His asthma is not well controlled so we will start him on daily Flovent HFA. As for the HTN we will increase the Clonidine to 0.2 mg bid and will increase the Losartan to 100 mg daily.  Laurey Morale, MD

## 2015-11-06 ENCOUNTER — Telehealth: Payer: Self-pay | Admitting: Family Medicine

## 2015-11-06 MED ORDER — INSULIN REGULAR HUMAN 100 UNIT/ML IJ SOLN: INTRAMUSCULAR | Status: DC

## 2015-11-06 MED ORDER — INSULIN NPH (HUMAN) (ISOPHANE) 100 UNIT/ML ~~LOC~~ SUSP: 32.0000 [IU] | Freq: Two times a day (BID) | SUBCUTANEOUS | Status: DC

## 2015-11-06 NOTE — Telephone Encounter (Signed)
°  insulin regular (HUMULIN R) 100 units/mL injection insulin NPH Human (HUMULIN N) 100 UNIT/ML injection  [facility]

## 2015-11-06 NOTE — Telephone Encounter (Signed)
Per Dr. Sarajane Jews okay to refill for 1 time only and I did send both scripts e-scribe and spoke with pt.

## 2015-11-25 MED FILL — FUROSEMIDE 40 MG TABLET: 40 | 90 days supply | Qty: 180 | Fill #1

## 2015-12-06 MED FILL — LOSARTAN POTASSIUM 50 MG TA: 50 | 30 days supply | Qty: 60 | Fill #0

## 2015-12-17 MED FILL — cloNIDine HCL 0.2 MG TABS: 0.2 | 30 days supply | Qty: 60 | Fill #0

## 2016-01-13 ENCOUNTER — Encounter: Payer: Self-pay | Admitting: Family Medicine

## 2016-01-13 ENCOUNTER — Ambulatory Visit (INDEPENDENT_AMBULATORY_CARE_PROVIDER_SITE_OTHER): Payer: PPO | Admitting: Family Medicine

## 2016-01-13 VITALS — BP 168/78 | HR 82 | Temp 98.3°F | Ht 72.0 in | Wt 256.0 lb

## 2016-01-13 DIAGNOSIS — K047 Periapical abscess without sinus: Secondary | ICD-10-CM

## 2016-01-13 MED ORDER — AMOXICILLIN-POT CLAVULANATE 875-125 MG PO TABS: 1.0000 | ORAL_TABLET | Freq: Two times a day (BID) | ORAL | 0 refills | Status: DC

## 2016-01-13 MED FILL — AMOX-CLAV 875-125 MG TABLET: 875-125 | 10 days supply | Qty: 20 | Fill #0

## 2016-01-13 NOTE — Progress Notes (Signed)
Pre visit review using our clinic review tool, if applicable. No additional management support is needed unless otherwise documented below in the visit note. 

## 2016-01-13 NOTE — Progress Notes (Signed)
   Subjective:    Patient ID: Alex Ryan, male    DOB: 06/23/1970, 45 y.o.   MRN: [id number]  HPI Here for 4 days of pain and swelling in the right cheek. He has known dental disease and he saw his dentist a week ago. They plan to remove all his upper teeth and let him wear a denture plate. No fever.    Review of Systems  Constitutional: Negative.   HENT: Positive for facial swelling and mouth sores. Negative for congestion, ear pain, postnasal drip, sinus pressure and sore throat.   Eyes: Negative.   Respiratory: Negative.   Cardiovascular: Negative.   Neurological: Negative.        Objective:   Physical Exam  Constitutional: He is oriented to person, place, and time. He appears well-developed and well-nourished.  HENT:  Right Ear: External ear normal.  Left Ear: External ear normal.  Nose: Nose normal.  The right cheek is mildly swollen and is tender. No erythema or warmth. He has extensive dental disease and he is very tender over the right upper gum area. There is an aphthous ulcer on the right buccal surface   Neck: Neck supple. No thyromegaly present.  Lymphadenopathy:    He has no cervical adenopathy.  Neurological: He is alert and oriented to person, place, and time.          Assessment & Plan:  He has cheek swelling from an abscessed tooth. Treat with Augmentin for 10 days. Use ice packs prn. He will see his dentist again next Monday.  Laurey Morale, MD

## 2016-01-31 ENCOUNTER — Other Ambulatory Visit: Payer: Self-pay | Admitting: Family Medicine

## 2016-01-31 MED FILL — cloNIDine HCL 0.2 MG TABS: 0.2 | 30 days supply | Qty: 60 | Fill #1

## 2016-01-31 MED FILL — LOSARTAN POTASSIUM 50 MG TA: 50 | 30 days supply | Qty: 60 | Fill #1

## 2016-01-31 NOTE — Telephone Encounter (Signed)
Rx refill sent to pharmacy. 

## 2016-02-06 ENCOUNTER — Telehealth: Payer: Self-pay | Admitting: Endocrinology

## 2016-02-06 NOTE — Telephone Encounter (Signed)
Please see below and advise.

## 2016-02-06 NOTE — Telephone Encounter (Signed)
Novolin N and R he is completely out of them both please advise he has been out for 3 days now, it is going to cost $50 for both which he doesn't have so what can we do

## 2016-02-06 NOTE — Telephone Encounter (Signed)
He can get the injections from [facility] using the relion brand.  We cannot prescribe anything unless he comes back for follow-up

## 2016-02-13 ENCOUNTER — Encounter: Payer: Self-pay | Admitting: Family Medicine

## 2016-02-13 ENCOUNTER — Ambulatory Visit (INDEPENDENT_AMBULATORY_CARE_PROVIDER_SITE_OTHER): Payer: PPO | Admitting: Family Medicine

## 2016-02-13 VITALS — BP 150/74 | HR 78 | Temp 98.2°F | Ht 70.0 in | Wt 266.0 lb

## 2016-02-13 DIAGNOSIS — L03119 Cellulitis of unspecified part of limb: Secondary | ICD-10-CM

## 2016-02-13 DIAGNOSIS — F411 Generalized anxiety disorder: Secondary | ICD-10-CM | POA: Diagnosis not present

## 2016-02-13 MED ORDER — ALPRAZOLAM 0.25 MG PO TABS: 0.2500 mg | ORAL_TABLET | Freq: Two times a day (BID) | ORAL | 2 refills | Status: AC | PRN

## 2016-02-13 MED ORDER — DOXYCYCLINE HYCLATE 100 MG PO CAPS: 100.0000 mg | ORAL_CAPSULE | Freq: Two times a day (BID) | ORAL | 0 refills | Status: AC

## 2016-02-13 MED ORDER — GABAPENTIN 300 MG PO CAPS: 300.0000 mg | ORAL_CAPSULE | Freq: Three times a day (TID) | ORAL | 5 refills | Status: DC

## 2016-02-13 MED ORDER — AMLODIPINE BESYLATE 5 MG PO TABS: 5.0000 mg | ORAL_TABLET | Freq: Every day | ORAL | 3 refills | Status: DC

## 2016-02-13 MED FILL — ALPRAZolam 0.25 MG TABS: 0.25 | 30 days supply | Qty: 60 | Fill #0

## 2016-02-13 MED FILL — DOXYCYCLINE HYCLATE 100 MG: 100 | 30 days supply | Qty: 60 | Fill #0

## 2016-02-13 MED FILL — FUROSEMIDE 40 MG TABLET: 40 | 90 days supply | Qty: 180 | Fill #0

## 2016-02-13 MED FILL — AMLODIPINE BESYLATE 5 MG TA: 5 | 90 days supply | Qty: 90 | Fill #0 | Status: TO

## 2016-02-13 NOTE — Progress Notes (Signed)
   Subjective:    Patient ID: Alex Ryan, male    DOB: [birth year], 45 y.o.   MRN: [id number]  HPI Here for several things. First over the past 4 days his legs have been swelling more than usual and the cellulitis has returned. The skin is red and warm, and has become painful. No fever. Also asks if can use Xanax for anxiety, he is still taking Prozac 40 mg bid but he feels anxious and has a short temper. He took Valium in the past, but he recently tried some of his wife's Xanax and this worked even better.    Review of Systems  Constitutional: Negative.   Respiratory: Negative.   Cardiovascular: Positive for leg swelling. Negative for chest pain and palpitations.  Skin: Positive for rash.  Psychiatric/Behavioral: Positive for agitation. Negative for confusion and hallucinations. The patient is nervous/anxious.        Objective:   Physical Exam  Constitutional: He is oriented to person, place, and time. He appears well-developed and well-nourished.  Cardiovascular: Normal rate, regular rhythm, normal heart sounds and intact distal pulses.   Pulmonary/Chest: Effort normal and breath sounds normal.  Musculoskeletal:  Both lower legs show 4+ edema. Both show erythema, warmth and tenderness  Neurological: He is alert and oriented to person, place, and time.  Psychiatric: He has a normal mood and affect. His behavior is normal. Thought content normal.          Assessment & Plan:  He has recurrent cellulitis, treat with Doxycycline. For the anxiety, try Xanax 0.25 mg bid prn.  Laurey Morale, MD

## 2016-02-13 NOTE — Progress Notes (Signed)
Pre visit review using our clinic review tool, if applicable. No additional management support is needed unless otherwise documented below in the visit note. 

## 2016-03-11 ENCOUNTER — Ambulatory Visit (INDEPENDENT_AMBULATORY_CARE_PROVIDER_SITE_OTHER): Payer: PPO | Admitting: Family Medicine

## 2016-03-11 ENCOUNTER — Encounter: Payer: Self-pay | Admitting: Family Medicine

## 2016-03-11 VITALS — BP 184/96 | HR 107 | Temp 102.2°F | Ht 70.0 in | Wt 268.0 lb

## 2016-03-11 DIAGNOSIS — E1142 Type 2 diabetes mellitus with diabetic polyneuropathy: Secondary | ICD-10-CM

## 2016-03-11 DIAGNOSIS — J209 Acute bronchitis, unspecified: Secondary | ICD-10-CM

## 2016-03-11 MED ORDER — AMOXICILLIN-POT CLAVULANATE 875-125 MG PO TABS: 1.0000 | ORAL_TABLET | Freq: Two times a day (BID) | ORAL | 0 refills | Status: DC

## 2016-03-11 MED ORDER — CEFTRIAXONE SODIUM 1 G IJ SOLR
1.0000 g | Freq: Once | INTRAMUSCULAR | Status: AC
  Administered 2016-03-11: 1 g via INTRAMUSCULAR

## 2016-03-11 MED ORDER — GABAPENTIN 300 MG PO CAPS: 600.0000 mg | ORAL_CAPSULE | Freq: Three times a day (TID) | ORAL | 5 refills | Status: DC

## 2016-03-11 MED FILL — GABAPENTIN 300 MG CAPSULE: 300 | 30 days supply | Qty: 180 | Fill #0

## 2016-03-11 MED FILL — AMOX TR-K CLV 875-125 MG TA: 875-125 | 10 days supply | Qty: 20 | Fill #0

## 2016-03-11 MED FILL — METOPROLOL SUCC ER 50 MG TA: 50 | 90 days supply | Qty: 180 | Fill #1 | Status: TO

## 2016-03-11 NOTE — Addendum Note (Signed)
Addended by: Aggie Hacker A on: 03/11/2016 02:15 PM   Modules accepted: Orders

## 2016-03-11 NOTE — Progress Notes (Signed)
   Subjective:    Patient ID: Alex Ryan, male    DOB: June 17, 1970, 45 y.o.   MRN: [id number]  HPI Here for the onset 2 days ago of fever, sweats, and a dry cough. He is nauseated but has not vomited. Appetite is poor but he is drinking fluids. His cellulitis in the legs is well controlled with Doxycycline. 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    Review of Systems  Constitutional: Negative for chills, diaphoresis and fever.  HENT: Negative.   Eyes: Negative.   Respiratory: Positive for cough, chest tightness and shortness of breath.   Cardiovascular: Negative.   Gastrointestinal: Positive for nausea. Negative for abdominal pain and vomiting.  Musculoskeletal: Positive for myalgias.       Objective:   Physical Exam  Constitutional: He is oriented to person, place, and time.  Ill appearing, weak and shaky   Cardiovascular: Normal rate, regular rhythm, normal heart sounds and intact distal pulses.   Pulmonary/Chest: Effort normal. No respiratory distress. He has no wheezes. He has no rales.  Scattered rhonchi   Neurological: He is alert and oriented to person, place, and time.          Assessment & Plan:  Bronchitis, treat with a shot of Rocephin and start on Augmentin for 10 days. For the neuropathy we will increase the Gabapentin to 600 mg TID.  Laurey Morale, MD

## 2016-03-11 NOTE — Progress Notes (Signed)
Pre visit review using our clinic review tool, if applicable. No additional management support is needed unless otherwise documented below in the visit note. 

## 2016-03-13 ENCOUNTER — Other Ambulatory Visit: Payer: Self-pay

## 2016-03-13 ENCOUNTER — Telehealth: Payer: Self-pay | Admitting: Family Medicine

## 2016-03-13 ENCOUNTER — Other Ambulatory Visit: Payer: PPO

## 2016-03-13 ENCOUNTER — Telehealth: Payer: Self-pay | Admitting: Endocrinology

## 2016-03-13 DIAGNOSIS — E1165 Type 2 diabetes mellitus with hyperglycemia: Secondary | ICD-10-CM

## 2016-03-13 DIAGNOSIS — Z794 Long term (current) use of insulin: Secondary | ICD-10-CM | POA: Diagnosis not present

## 2016-03-13 MED ORDER — ALCOHOL SWABS PADS: MEDICATED_PAD | 3 refills | Status: AC

## 2016-03-13 MED ORDER — GLUCOSE BLOOD VI STRP: ORAL_STRIP | 12 refills | Status: DC

## 2016-03-13 MED ORDER — "INSULIN SYRINGE 31G X 5/16"" 1 ML MISC": 1 refills | Status: AC

## 2016-03-13 MED ORDER — FREESTYLE LITE DEVI: 1.0000 | Freq: Three times a day (TID) | 0 refills | Status: DC

## 2016-03-13 MED FILL — SM ALCOHOL 70% PREP PADS: 70 | 30 days supply | Qty: 300 | Fill #0

## 2016-03-13 MED FILL — FREESTYLE LITE METER: 20 days supply | Qty: 1 | Fill #0

## 2016-03-13 MED FILL — FREESTYLE LITE TEST STRIP: 30 days supply | Qty: 100 | Fill #0

## 2016-03-13 NOTE — Telephone Encounter (Signed)
Pt states he needs the 100 UNIT/ML injection syringes for his insulin. [facility]/ [address]  Pt would like a 90 day supply/ 3 boxes/ 100 ea Pt takes 3 inj /day

## 2016-03-13 NOTE — Telephone Encounter (Signed)
Pt needs refill on insulin and syringes and alcohol pads called to [facility] please

## 2016-03-13 NOTE — Telephone Encounter (Signed)
All prescriptions ordered

## 2016-03-14 LAB — FRUCTOSAMINE: Fructosamine: 300 umol/L — ABNORMAL HIGH (ref 0–285)

## 2016-03-16 ENCOUNTER — Encounter: Payer: Self-pay | Admitting: Endocrinology

## 2016-03-16 ENCOUNTER — Ambulatory Visit (INDEPENDENT_AMBULATORY_CARE_PROVIDER_SITE_OTHER): Payer: PPO | Admitting: Endocrinology

## 2016-03-16 VITALS — BP 164/98 | HR 82 | Ht 70.0 in | Wt 266.0 lb

## 2016-03-16 DIAGNOSIS — Z794 Long term (current) use of insulin: Secondary | ICD-10-CM

## 2016-03-16 DIAGNOSIS — E1165 Type 2 diabetes mellitus with hyperglycemia: Secondary | ICD-10-CM | POA: Diagnosis not present

## 2016-03-16 LAB — POCT GLYCOSYLATED HEMOGLOBIN (HGB A1C): HEMOGLOBIN A1C: 11.8

## 2016-03-16 LAB — SPECIMEN STATUS REPORT

## 2016-03-16 MED ORDER — CANAGLIFLOZIN 100 MG PO TABS: ORAL_TABLET | ORAL | 3 refills | Status: DC

## 2016-03-16 MED FILL — INVOKANA 100 MG TABLET: 100 | 30 days supply | Qty: 30 | Fill #0

## 2016-03-16 NOTE — Telephone Encounter (Signed)
Patient need a refill ofI  nsulin NPH Human (HUMULIN N) 100 UNIT/ML injection insulin regular (HUMULIN R) 100 units/mL injection  [place], [place] - [address] [phone] (Phone) [phone] (Fax)

## 2016-03-16 NOTE — Patient Instructions (Addendum)
N insulin 40 in am and 40 at bedtime  Regular inulin 10 units at Bfst and supper and 6 units at lunch  No Cokes etc  Invokana in am  Check blood sugars on waking up  3-4x per week  Also check blood sugars about 2 hours after a meal and do this after different meals by rotation  Recommended blood sugar levels on waking up is 90-130 and about 2 hours after meal is 130-160  Please bring your blood sugar monitor to each visit, thank you

## 2016-03-16 NOTE — Progress Notes (Signed)
Patient ID: Alex Ryan, male   DOB: [birth year], 45 y.o.   MRN: [id number]           Reason for Appointment: Follow-up for Type 2 Diabetes  Referring physician: Sarajane Jews  History of Present Illness:          Date of diagnosis of type 2 diabetes mellitus: 1996       Background history:  He had been on oral hypoglycemic drugs for several years with variable control, prior history is not available in the record Because of poor control in 2012 he was tried on Byetta in addition to his metformin and Amaryl Apparently this was not effective and he was started on insulin in 2013 with Lantus and Humalog, his A1c then was 8.3 He was also ordered an insulin pump by his other endocrinologist about 2 years ago but he could not do this because of visual difficulties He was seen in consultation in 2016 for persistently poorly controlled diabetes and was taking large doses of Toujeo and Humalog along with Invokamet.  Recent history:   INSULIN regimen is described as:  Novolin N, 35 Units acb and acs; Novolin R: 8 at breakfast and 4 before supper    He has not been seen in follow-up for several months again and blood sugars are out of control with A1c 11.8 previously 6.8  Again has been on NPH and Regular Insulin because of the low cost of this regimen  Current blood sugar patterns and problems identified:  He was previously having good control because of doing very well on his diet  More recently has had recurrent infections of various kinds  Also not paying attention to diet and also drinking regular drinks  Despite his increasing his insulin on his own his blood sugars appear to be consistently over 200 at all times and today over 300 after his drinking or Coke  He is still not checking his blood sugars consistently and he says he lost his meter recently.  Not clear why is taking only a small dose of regular insulin at suppertime even though he has increased breakfast dose.  No coverage  currently for lunch  Oral hypoglycemic drugs the patient is taking are:    None Side effects from medications have been: None  Compliance with the medical regimen: Improved Hypoglycemia:   none for some time  Glucose monitoring:  done 0-2 times a day         Glucometer: Freestyle, getting new meter today    Blood Glucose readings from recall:  Mean values apply above for all meters except median for One Touch  PRE-MEAL Fasting Lunch Dinner Bedtime Overall  Glucose range: 200 280 250 288-300   Mean/median:         Self-care: The diet that the patient has been following is: tries to limit fatty foods. 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 Always use your most recent med list.          albuterol 108 (90 Base) MCG/ACT inhaler Commonly known as:  PROVENTIL HFA;VENTOLIN HFA Inhale 2 puffs into the lungs every 4 (four) hours as needed for wheezing or shortness of breath. PRN:  Allergies/breathing   Alcohol Swabs Pads Use up to 10 times per day for testing and insulin injections, diagnosis code is 250.00   ALPRAZolam 0.25 MG tablet Commonly known as:  XANAX Take 1 tablet (0.25 mg total) by mouth 2 (two) times daily as needed for anxiety.   amLODipine 5 MG tablet Commonly known as:  NORVASC Take 1 tablet (5 mg total) by mouth daily at 2 PM.   amoxicillin-clavulanate  875-125 MG tablet Commonly known as:  AUGMENTIN Take 1 tablet by mouth 2 (two) times daily.   aspirin 325 MG tablet Take 325 mg by mouth daily. Reported on 05/29/2015   canagliflozin 100 MG Tabs tablet Commonly known as:  INVOKANA 1 tablet before breakfast   cloNIDine 0.2 MG tablet Commonly known as:  CATAPRES Take 1 tablet (0.2 mg total) by mouth 2 (two) times daily.   FLUoxetine 40 MG capsule Commonly known as:  PROZAC Take 1 capsule (40 mg total) by mouth daily.   fluticasone 220 MCG/ACT inhaler Commonly known as:  FLOVENT HFA Inhale 2 puffs into the lungs 2 (two) times daily.   FREESTYLE LITE Devi 1 each by Does not apply route 3 (three) times daily.   furosemide 40 MG tablet Commonly known as:  LASIX TAKE 1 TABLET BY MOUTH TWICE DAILY   gabapentin 300 MG capsule Commonly known as:  NEURONTIN Take 2 capsules (600 mg total) by mouth 3 (three) times daily.   glucose blood test strip Commonly known as:  FREESTYLE LITE Use to test blood sugar 3 times daily   insulin NPH Human 100 UNIT/ML injection Commonly known as:  HUMULIN N Inject 0.32 mLs (32 Units total) into the skin 2 (two) times daily before a meal.   insulin regular 100 units/mL injection Commonly known as:  HUMULIN R 8 units in the morning, 4 in the evening   INSULIN SYRINGE 1CC/31GX5/16" 31G X 5/16" 1 ML Misc 3 times daily   loratadine 10 MG tablet Commonly known as:  CLARITIN Take 10 mg by mouth daily.   losartan 100 MG tablet Commonly known as:  COZAAR Take 1 tablet (100 mg total) by mouth daily.   metoprolol succinate 50 MG 24 hr tablet Commonly known as:  TOPROL XL Take 1 tablet (50 mg total) by mouth 2 (two) times daily. Take with or immediately following a meal.   Potassium Chloride ER 20 MEQ Tbcr Commonly known as:  KLOR-CON 10 Take 20 mEq by mouth daily.       Allergies:  Allergies  Allergen Reactions  . Codeine Hives and Nausea And Vomiting    Hyper  . Erythromycin Nausea And  Vomiting  . Hibiclens [Chlorhexidine] Itching  . Other     Pre-op cleaning wipes:. itching  . Soap Itching    "dial soap"  . Vancomycin Swelling    Patient reports severe bilateral upper extremity swelling with vancomycin  . Keflex [Cephalexin] Rash    Past Medical History:  Diagnosis Date  .  Allergic rhinitis   . Anxiety   . Asthma   . Bladder infection    in June -Was on Cipro  . Cellulitis    legs - hx  . Chronic kidney disease   . CKD (chronic kidney disease), stage II   . Diabetes mellitus    sees Dr. Jacelyn Pi   . H/O hiatal hernia    pt thinks so  . Headache(784.0)    had migraine- not in years  . Heart murmur    Followed by Dr Stanford Breed  . Hyperlipidemia   . Hypertension   . Neuropathy in diabetes ([place])   . OSA (obstructive sleep apnea)    CPAP- not wearing  . Recurrent upper respiratory infection (URI)    frequent bronchcitis  . Retinopathy    right eye  . Retinopathy of both eyes    sees Dr. Zadie Rhine   . Shingles 2010    Past Surgical History:  Procedure Laterality Date  . EYE SURGERY  2012   Cataract with lens  Right  . PARS PLANA VITRECTOMY  12/04/2011   Procedure: PARS PLANA VITRECTOMY WITH 25 GAUGE;  Surgeon: Hurman Horn, MD;  Location: [facility];  Service: Ophthalmology;  Laterality: Left;  Insertion Silicon Oil [id number] CS and    . PARS PLANA VITRECTOMY W/ SCLERAL BUCKLE  09/01/2011   Procedure: PARS PLANA VITRECTOMY WITH LASER FOR MACULAR HOLE;  Surgeon: Hurman Horn, MD;  Location: [facility];  Service: Ophthalmology;  Laterality: Right;  Pars Plana Vitrectomy with Laser and Membrane Peel; Insertion Silicone Oil  . TONSILLECTOMY  1983  . VITRECTOMY  2013   per Dr. Zadie Rhine    Family History  Problem Relation Age of Onset  . Diabetes Mother   . Transient ischemic attack Father   . Cancer Father   . Anesthesia problems Neg Hx   . Diabetes Maternal Uncle   . Diabetes Maternal Grandmother   . Heart disease Maternal Grandfather     Social History:   reports that he quit smoking about 6 years ago. He has a 8.70 pack-year smoking history. He has quit using smokeless tobacco. He reports that he does not drink alcohol or use drugs.    Review of Systems    Lipid history: Currently not on any medication for lipids    Lab Results  Component Value Date   CHOL 151 07/23/2015   HDL 37.70 (L) 07/23/2015   LDLCALC 90 07/23/2015   LDLDIRECT 103.0 11/05/2014   TRIG 119.0 07/23/2015   CHOLHDL 4 07/23/2015           Most recent eye exam was 6/15, Has not made follow-up  Diabetic foot exam in 5/16 showed sensory loss  He has had hypogonadism but not treated because of cost of medications  HYPERTENSION: He has been prescribed clonidine, losartan, metoprolol and amlodipine 5mg  for this Blood pressure has been poorly controlled  BP Readings from Last 3 Encounters:  03/16/16 (!) 164/98  03/11/16 (!) 184/96  02/13/16 (!) 150/74    He has a tendency to edema despite taking high-dose Lasix  LABS:  Office Visit on 03/16/2016  Component Date Value Ref Range Status  . Hemoglobin A1C 03/16/2016 11.8   Final  Lab on 03/13/2016  Component Date Value Ref Range Status  . Fructosamine 03/14/2016 300* 0 - 285 umol/L Final   Comment: Published reference interval for apparently healthy subjects between age 52 and 65 is 56 - 285 umol/L and in a poorly controlled diabetic  population is 228 - 563 umol/L with a mean of 396 umol/L.     Physical Examination:  BP (!) 164/98   Pulse 82   Ht 5\' 10"  (1.778 m)   Wt 266 lb (120.7 kg)   SpO2 97%   BMI 38.17 kg/m       Lower legs appear swollen, has also chronic skin changes    ASSESSMENT:  Diabetes type 2, uncontrolled See history of present illness for detailed discussion of current diabetes management, blood sugar patterns and problems identified     He has had poor control in the past with significant insulin requirement He will know he had done well on his last visit his blood sugars  are totally out controlled now despite taking insulin and increasing doses compared to his last visit This is likely to be from his being very inconsistent with his diet especially drinking regular soft drinks He has high readings throughout the day and probably higher readings after meals  DIABETIC nephropathy: He has significant proteinuria   Also his blood pressure is not controlled with current regimen  PLAN:   Recheck baseline renal function today  Restart Invokana 100 mg daily and may also and metformin on the next visit  Increase insulin doses and change evening NPH to bedtime  He is to start covering lunch with regular insulin and increase suppertime coverage to 10 units  Strict diet and no regular soft drinks  Check blood sugars consistently at various times as directed and bring monitor for download on next visit  Consultation with dietitian  Patient Instructions  N insulin 40 in am and 40 at bedtime  Regular inulin 10 units at Bfst and supper and 6 units at lunch  No Cokes etc  Invokana in am  Check blood sugars on waking up  3-4x per week  Also check blood sugars about 2 hours after a meal and do this after different meals by rotation  Recommended blood sugar levels on waking up is 90-130 and about 2 hours after meal is 130-160  Please bring your blood sugar monitor to each visit, thank you         [facility] 03/16/2016, 12:52 PM   Note: This office note was prepared with Dragon voice recognition system technology. Any transcriptional errors that result from this process are unintentional.

## 2016-03-16 NOTE — Telephone Encounter (Signed)
Please send updated prescription based on my note today

## 2016-03-17 ENCOUNTER — Other Ambulatory Visit: Payer: Self-pay

## 2016-03-17 MED ORDER — CANAGLIFLOZIN 100 MG PO TABS: ORAL_TABLET | ORAL | 1 refills | Status: DC

## 2016-03-17 MED ORDER — INSULIN REGULAR HUMAN 100 UNIT/ML IJ SOLN: INTRAMUSCULAR | 2 refills | Status: AC

## 2016-03-17 MED ORDER — INSULIN NPH (HUMAN) (ISOPHANE) 100 UNIT/ML ~~LOC~~ SUSP: 40.0000 [IU] | Freq: Two times a day (BID) | SUBCUTANEOUS | 2 refills | Status: AC

## 2016-03-17 NOTE — Telephone Encounter (Signed)
I left a voice message for pt, this script should come from Dr. Dwyane Dee.

## 2016-03-17 NOTE — Progress Notes (Signed)
Please let patient know that the kidney test is significantly worse than 7 months ago and cannot start Invokana at this time Encourage him to watch his diet better and increase insulin another 5 units if blood sugars are not better in 3 days Needs to discuss lab result with his PCP

## 2016-03-17 NOTE — Telephone Encounter (Signed)
Prescriptions ordered according to Dr. Ronnie Derby notes from last visit

## 2016-03-21 LAB — COMPREHENSIVE METABOLIC PANEL
A/G RATIO: 1 — AB (ref 1.2–2.2)
ALBUMIN: 2.5 g/dL — AB (ref 3.5–5.5)
ALK PHOS: 181 IU/L — AB (ref 39–117)
ALT: 17 IU/L (ref 0–44)
AST: 16 IU/L (ref 0–40)
BUN / CREAT RATIO: 18 (ref 9–20)
BUN: 29 mg/dL — ABNORMAL HIGH (ref 6–24)
CHLORIDE: 93 mmol/L — AB (ref 96–106)
CO2: 24 mmol/L (ref 18–29)
Calcium: 8.2 mg/dL — ABNORMAL LOW (ref 8.7–10.2)
Creatinine, Ser: 1.57 mg/dL — ABNORMAL HIGH (ref 0.76–1.27)
GFR calc Af Amer: 60 mL/min/{1.73_m2} (ref >59)
GFR calc non Af Amer: 52 mL/min/{1.73_m2} — ABNORMAL LOW (ref >59)
GLOBULIN, TOTAL: 2.6 g/dL (ref 1.5–4.5)
Glucose: 601 mg/dL (ref 65–99)
POTASSIUM: 5.1 mmol/L (ref 3.5–5.2)
SODIUM: 139 mmol/L (ref 134–144)
Total Protein: 5.1 g/dL — ABNORMAL LOW (ref 6.0–8.5)

## 2016-03-21 LAB — SPECIMEN STATUS REPORT

## 2016-03-23 MED FILL — LOSARTAN POTASSIUM 50 MG TA: 50 | 30 days supply | Qty: 60 | Fill #2 | Status: TO

## 2016-03-23 MED FILL — cloNIDine HCL 0.2 MG TABS: 0.2 | 30 days supply | Qty: 60 | Fill #2 | Status: TO

## 2016-04-01 ENCOUNTER — Telehealth: Payer: Self-pay | Admitting: Endocrinology

## 2016-04-01 NOTE — Telephone Encounter (Signed)
[facility] pharmacy needs the status of the humulin N and Humulin R Prior Auth  Please call [place] to discuss

## 2016-04-06 ENCOUNTER — Other Ambulatory Visit: Payer: PPO

## 2016-04-08 ENCOUNTER — Other Ambulatory Visit (INDEPENDENT_AMBULATORY_CARE_PROVIDER_SITE_OTHER): Payer: PPO

## 2016-04-08 DIAGNOSIS — Z794 Long term (current) use of insulin: Secondary | ICD-10-CM

## 2016-04-08 DIAGNOSIS — E1165 Type 2 diabetes mellitus with hyperglycemia: Secondary | ICD-10-CM

## 2016-04-08 LAB — COMPREHENSIVE METABOLIC PANEL
ALT: 13 U/L (ref 0–53)
AST: 15 U/L (ref 0–37)
Albumin: 2.7 g/dL — ABNORMAL LOW (ref 3.5–5.2)
Alkaline Phosphatase: 129 U/L — ABNORMAL HIGH (ref 39–117)
BUN: 12 mg/dL (ref 6–23)
CALCIUM: 8.7 mg/dL (ref 8.4–10.5)
CO2: 29 meq/L (ref 19–32)
CREATININE: 0.91 mg/dL (ref 0.40–1.50)
Chloride: 106 mEq/L (ref 96–112)
GFR: 95.23 mL/min (ref >60.00)
GLUCOSE: 262 mg/dL — AB (ref 70–99)
Potassium: 4.2 mEq/L (ref 3.5–5.1)
SODIUM: 141 meq/L (ref 135–145)
Total Bilirubin: 0.5 mg/dL (ref 0.2–1.2)
Total Protein: 5.9 g/dL — ABNORMAL LOW (ref 6.0–8.3)

## 2016-04-13 ENCOUNTER — Ambulatory Visit: Payer: PPO | Admitting: Endocrinology

## 2016-04-14 ENCOUNTER — Encounter: Payer: Self-pay | Admitting: Family Medicine

## 2016-04-14 ENCOUNTER — Ambulatory Visit (INDEPENDENT_AMBULATORY_CARE_PROVIDER_SITE_OTHER): Payer: PPO | Admitting: Family Medicine

## 2016-04-14 DIAGNOSIS — J4520 Mild intermittent asthma, uncomplicated: Secondary | ICD-10-CM

## 2016-04-14 DIAGNOSIS — Z9109 Other allergy status, other than to drugs and biological substances: Secondary | ICD-10-CM

## 2016-04-14 MED ORDER — METHYLPREDNISOLONE ACETATE 80 MG/ML IJ SUSP
80.0000 mg | Freq: Once | INTRAMUSCULAR | Status: AC
  Administered 2016-04-14: 120 mg via INTRAMUSCULAR

## 2016-04-14 NOTE — Progress Notes (Signed)
Pre visit review using our clinic review tool, if applicable. No additional management support is needed unless otherwise documented below in the visit note. 

## 2016-04-14 NOTE — Addendum Note (Signed)
Addended by: Wyvonne Lenz on: 04/14/2016 04:06 PM   Modules accepted: Orders

## 2016-04-14 NOTE — Progress Notes (Signed)
   Subjective:    Patient ID: Alex Ryan, male    DOB: 25-Aug-1970, 45 y.o.   MRN: [id number]  HPI Here for the onset yesterday of a dry cough with runny nose and PND. No SOB or chest pain or fever. He has not used his albuterol inhaler because it makes his BP jump up. He has not used a Flovent inhaler for months because it is too expensive.    Review of Systems  Constitutional: Negative.   HENT: Positive for postnasal drip, rhinorrhea and sneezing. Negative for congestion, sinus pain, sinus pressure and sore throat.   Eyes: Negative.   Respiratory: Positive for cough. Negative for choking, chest tightness, shortness of breath and wheezing.   Cardiovascular: Negative.   Neurological: Negative.        Objective:   Physical Exam  Constitutional: He is oriented to person, place, and time. He appears well-developed and well-nourished. No distress.  HENT:  Right Ear: External ear normal.  Left Ear: External ear normal.  Nose: Nose normal.  Mouth/Throat: Oropharynx is clear and moist.  Eyes: Conjunctivae are normal.  Neck: No thyromegaly present.  Cardiovascular: Normal rate, regular rhythm, normal heart sounds and intact distal pulses.   Pulmonary/Chest: Effort normal and breath sounds normal. No respiratory distress. He has no wheezes. He has no rales.  Lymphadenopathy:    He has no cervical adenopathy.  Neurological: He is alert and oriented to person, place, and time.          Assessment & Plan:  He has an allergy flare with an asthma exacerbation. He will drink fluids. Given a steroid shot. He will check with his insurance company to see if they would prefer another type of steroid inhaler for him to use daily which would be cheaper.  Laurey Morale, MD

## 2016-04-15 ENCOUNTER — Ambulatory Visit: Payer: PPO | Admitting: Endocrinology

## 2016-04-16 ENCOUNTER — Telehealth: Payer: Self-pay | Admitting: Family Medicine

## 2016-04-16 ENCOUNTER — Encounter: Payer: PPO | Admitting: Dietician

## 2016-04-16 NOTE — Telephone Encounter (Signed)
° ° ° °  Pt call to say he has gotten worse and now has chest congestion was seen Tuesday. Pt is asking if something can be called in    [facility]

## 2016-04-17 NOTE — Telephone Encounter (Signed)
Please advise 

## 2016-04-20 MED ORDER — AMOXICILLIN-POT CLAVULANATE 875-125 MG PO TABS: 1.0000 | ORAL_TABLET | Freq: Two times a day (BID) | ORAL | 0 refills | Status: DC

## 2016-04-20 NOTE — Telephone Encounter (Signed)
I sent script e-scribe to [facility] and left a voice message for pt with this information.

## 2016-04-20 NOTE — Telephone Encounter (Signed)
Call in Augmentin [phone] bid for 10 days  

## 2016-04-23 ENCOUNTER — Ambulatory Visit: Payer: PPO | Admitting: Endocrinology

## 2016-04-23 DIAGNOSIS — Z0289 Encounter for other administrative examinations: Secondary | ICD-10-CM

## 2016-05-19 MED FILL — AMOX TR-K CLV 875-125 MG TA: 875-125 | 10 days supply | Qty: 20 | Fill #0

## 2016-06-05 ENCOUNTER — Ambulatory Visit (INDEPENDENT_AMBULATORY_CARE_PROVIDER_SITE_OTHER): Payer: PPO | Admitting: Family Medicine

## 2016-06-05 ENCOUNTER — Encounter: Payer: Self-pay | Admitting: Family Medicine

## 2016-06-05 VITALS — BP 164/84 | HR 80 | Temp 98.1°F | Ht 70.0 in | Wt 259.0 lb

## 2016-06-05 DIAGNOSIS — Z23 Encounter for immunization: Secondary | ICD-10-CM | POA: Diagnosis not present

## 2016-06-05 DIAGNOSIS — H60391 Other infective otitis externa, right ear: Secondary | ICD-10-CM

## 2016-06-05 MED ORDER — CIPROFLOXACIN-DEXAMETHASONE 0.3-0.1 % OT SUSP: 4.0000 [drp] | Freq: Two times a day (BID) | OTIC | 0 refills | Status: DC

## 2016-06-05 NOTE — Addendum Note (Signed)
Addended by: Aggie Hacker A on: 06/05/2016 03:42 PM   Modules accepted: Orders

## 2016-06-05 NOTE — Progress Notes (Signed)
Pre visit review using our clinic review tool, if applicable. No additional management support is needed unless otherwise documented below in the visit note. 

## 2016-06-05 NOTE — Progress Notes (Signed)
   Subjective:    Patient ID: Alex Ryan, male    DOB: Oct 21, 1970, 46 y.o.   MRN: [id number]  HPI Here for 3 days of pain in the right ear. No ST or fever or cough.    Review of Systems  Constitutional: Negative.   HENT: Positive for ear pain. Negative for congestion, ear discharge, hearing loss, postnasal drip, sinus pain and sinus pressure.   Eyes: Negative.   Respiratory: Negative.        Objective:   Physical Exam  Constitutional: He appears well-developed and well-nourished.  HENT:  Left Ear: External ear normal.  Nose: Nose normal.  Mouth/Throat: Oropharynx is clear and moist.  Right ear canal is red, TM is clear   Eyes: Conjunctivae are normal.  Pulmonary/Chest: Effort normal and breath sounds normal.  Lymphadenopathy:    He has no cervical adenopathy.          Assessment & Plan:  Otitis externa, treat with Ciprodex drops.  Alysia Penna, MD

## 2016-06-15 DIAGNOSIS — M9900 Segmental and somatic dysfunction of head region: Secondary | ICD-10-CM | POA: Diagnosis not present

## 2016-06-15 DIAGNOSIS — M545 Low back pain: Secondary | ICD-10-CM | POA: Diagnosis not present

## 2016-06-15 DIAGNOSIS — M9901 Segmental and somatic dysfunction of cervical region: Secondary | ICD-10-CM | POA: Diagnosis not present

## 2016-06-15 DIAGNOSIS — M9903 Segmental and somatic dysfunction of lumbar region: Secondary | ICD-10-CM | POA: Diagnosis not present

## 2016-06-16 DIAGNOSIS — M9901 Segmental and somatic dysfunction of cervical region: Secondary | ICD-10-CM | POA: Diagnosis not present

## 2016-06-16 DIAGNOSIS — M545 Low back pain: Secondary | ICD-10-CM | POA: Diagnosis not present

## 2016-06-16 DIAGNOSIS — M9900 Segmental and somatic dysfunction of head region: Secondary | ICD-10-CM | POA: Diagnosis not present

## 2016-06-16 DIAGNOSIS — M9903 Segmental and somatic dysfunction of lumbar region: Secondary | ICD-10-CM | POA: Diagnosis not present

## 2016-06-18 DIAGNOSIS — M9903 Segmental and somatic dysfunction of lumbar region: Secondary | ICD-10-CM | POA: Diagnosis not present

## 2016-06-18 DIAGNOSIS — M9900 Segmental and somatic dysfunction of head region: Secondary | ICD-10-CM | POA: Diagnosis not present

## 2016-06-18 DIAGNOSIS — M9901 Segmental and somatic dysfunction of cervical region: Secondary | ICD-10-CM | POA: Diagnosis not present

## 2016-06-18 DIAGNOSIS — M545 Low back pain: Secondary | ICD-10-CM | POA: Diagnosis not present

## 2016-06-25 DIAGNOSIS — M9903 Segmental and somatic dysfunction of lumbar region: Secondary | ICD-10-CM | POA: Diagnosis not present

## 2016-06-25 DIAGNOSIS — M9900 Segmental and somatic dysfunction of head region: Secondary | ICD-10-CM | POA: Diagnosis not present

## 2016-06-25 DIAGNOSIS — M9901 Segmental and somatic dysfunction of cervical region: Secondary | ICD-10-CM | POA: Diagnosis not present

## 2016-06-25 DIAGNOSIS — M545 Low back pain: Secondary | ICD-10-CM | POA: Diagnosis not present

## 2016-06-29 DIAGNOSIS — M9901 Segmental and somatic dysfunction of cervical region: Secondary | ICD-10-CM | POA: Diagnosis not present

## 2016-06-29 DIAGNOSIS — M9900 Segmental and somatic dysfunction of head region: Secondary | ICD-10-CM | POA: Diagnosis not present

## 2016-06-29 DIAGNOSIS — M545 Low back pain: Secondary | ICD-10-CM | POA: Diagnosis not present

## 2016-06-29 DIAGNOSIS — M9903 Segmental and somatic dysfunction of lumbar region: Secondary | ICD-10-CM | POA: Diagnosis not present

## 2016-06-30 ENCOUNTER — Other Ambulatory Visit: Payer: Self-pay | Admitting: Family Medicine

## 2016-06-30 MED FILL — FUROSEMIDE 40 MG TABLET: 40 | 90 days supply | Qty: 180 | Fill #0

## 2016-07-06 DIAGNOSIS — M545 Low back pain: Secondary | ICD-10-CM | POA: Diagnosis not present

## 2016-07-06 DIAGNOSIS — M9900 Segmental and somatic dysfunction of head region: Secondary | ICD-10-CM | POA: Diagnosis not present

## 2016-07-06 DIAGNOSIS — M9901 Segmental and somatic dysfunction of cervical region: Secondary | ICD-10-CM | POA: Diagnosis not present

## 2016-07-06 DIAGNOSIS — M9903 Segmental and somatic dysfunction of lumbar region: Secondary | ICD-10-CM | POA: Diagnosis not present

## 2016-07-09 DIAGNOSIS — M9901 Segmental and somatic dysfunction of cervical region: Secondary | ICD-10-CM | POA: Diagnosis not present

## 2016-07-09 DIAGNOSIS — M9900 Segmental and somatic dysfunction of head region: Secondary | ICD-10-CM | POA: Diagnosis not present

## 2016-07-09 DIAGNOSIS — M9903 Segmental and somatic dysfunction of lumbar region: Secondary | ICD-10-CM | POA: Diagnosis not present

## 2016-07-09 DIAGNOSIS — M545 Low back pain: Secondary | ICD-10-CM | POA: Diagnosis not present

## 2016-07-13 DIAGNOSIS — M545 Low back pain: Secondary | ICD-10-CM | POA: Diagnosis not present

## 2016-07-13 DIAGNOSIS — M9900 Segmental and somatic dysfunction of head region: Secondary | ICD-10-CM | POA: Diagnosis not present

## 2016-07-13 DIAGNOSIS — M9903 Segmental and somatic dysfunction of lumbar region: Secondary | ICD-10-CM | POA: Diagnosis not present

## 2016-07-13 DIAGNOSIS — M9901 Segmental and somatic dysfunction of cervical region: Secondary | ICD-10-CM | POA: Diagnosis not present

## 2016-07-15 ENCOUNTER — Telehealth: Payer: Self-pay | Admitting: Family Medicine

## 2016-07-15 MED ORDER — DOXYCYCLINE HYCLATE 100 MG PO TABS: 100.0000 mg | ORAL_TABLET | Freq: Two times a day (BID) | ORAL | 0 refills | Status: DC

## 2016-07-15 NOTE — Telephone Encounter (Signed)
° ° ° °  Pt said Dr Sarajane Jews told him to call back if he has a flare up for cellulittis.    [phone]

## 2016-07-15 NOTE — Telephone Encounter (Signed)
Call in Doxycycline 100 mg bid for 30 days  

## 2016-07-15 NOTE — Telephone Encounter (Signed)
I sent script e-scribe to [facility] and left a voice message for pt with this information.

## 2016-07-27 DIAGNOSIS — M9903 Segmental and somatic dysfunction of lumbar region: Secondary | ICD-10-CM | POA: Diagnosis not present

## 2016-07-27 DIAGNOSIS — M545 Low back pain: Secondary | ICD-10-CM | POA: Diagnosis not present

## 2016-07-27 DIAGNOSIS — M9900 Segmental and somatic dysfunction of head region: Secondary | ICD-10-CM | POA: Diagnosis not present

## 2016-07-27 DIAGNOSIS — M9901 Segmental and somatic dysfunction of cervical region: Secondary | ICD-10-CM | POA: Diagnosis not present

## 2016-07-28 DIAGNOSIS — E1142 Type 2 diabetes mellitus with diabetic polyneuropathy: Secondary | ICD-10-CM | POA: Diagnosis not present

## 2016-07-28 DIAGNOSIS — L602 Onychogryphosis: Secondary | ICD-10-CM | POA: Diagnosis not present

## 2016-07-28 DIAGNOSIS — R600 Localized edema: Secondary | ICD-10-CM | POA: Diagnosis not present

## 2016-07-28 DIAGNOSIS — M2041 Other hammer toe(s) (acquired), right foot: Secondary | ICD-10-CM | POA: Diagnosis not present

## 2016-07-28 DIAGNOSIS — B351 Tinea unguium: Secondary | ICD-10-CM | POA: Diagnosis not present

## 2016-08-04 DIAGNOSIS — M9900 Segmental and somatic dysfunction of head region: Secondary | ICD-10-CM | POA: Diagnosis not present

## 2016-08-04 DIAGNOSIS — M9903 Segmental and somatic dysfunction of lumbar region: Secondary | ICD-10-CM | POA: Diagnosis not present

## 2016-08-04 DIAGNOSIS — M9901 Segmental and somatic dysfunction of cervical region: Secondary | ICD-10-CM | POA: Diagnosis not present

## 2016-08-04 DIAGNOSIS — M545 Low back pain: Secondary | ICD-10-CM | POA: Diagnosis not present

## 2016-08-10 ENCOUNTER — Telehealth: Payer: Self-pay | Admitting: Family Medicine

## 2016-08-10 ENCOUNTER — Ambulatory Visit (INDEPENDENT_AMBULATORY_CARE_PROVIDER_SITE_OTHER): Payer: Medicare Other | Admitting: Family Medicine

## 2016-08-10 ENCOUNTER — Encounter: Payer: Self-pay | Admitting: Family Medicine

## 2016-08-10 VITALS — BP 182/90 | HR 83 | Temp 98.1°F | Ht 70.0 in | Wt 281.0 lb

## 2016-08-10 DIAGNOSIS — I10 Essential (primary) hypertension: Secondary | ICD-10-CM | POA: Diagnosis not present

## 2016-08-10 DIAGNOSIS — R601 Generalized edema: Secondary | ICD-10-CM

## 2016-08-10 DIAGNOSIS — L03119 Cellulitis of unspecified part of limb: Secondary | ICD-10-CM | POA: Diagnosis not present

## 2016-08-10 DIAGNOSIS — R609 Edema, unspecified: Secondary | ICD-10-CM | POA: Insufficient documentation

## 2016-08-10 MED ORDER — POTASSIUM CHLORIDE ER 10 MEQ PO TBCR: 10.0000 meq | EXTENDED_RELEASE_TABLET | Freq: Two times a day (BID) | ORAL | 5 refills | Status: DC

## 2016-08-10 MED ORDER — FUROSEMIDE 40 MG PO TABS: ORAL_TABLET | ORAL | 5 refills | Status: AC

## 2016-08-10 MED ORDER — CLONIDINE HCL 0.2 MG PO TABS: 0.2000 mg | ORAL_TABLET | Freq: Three times a day (TID) | ORAL | 5 refills | Status: DC

## 2016-08-10 MED ORDER — METOPROLOL SUCCINATE ER 100 MG PO TB24: 100.0000 mg | ORAL_TABLET | Freq: Two times a day (BID) | ORAL | 5 refills | Status: AC

## 2016-08-10 NOTE — Patient Instructions (Signed)
WE NOW OFFER   [facility] FAST TRACK!!!  SAME DAY Appointments for ACUTE CARE  Such as: Sprains, Injuries, cuts, abrasions, rashes, muscle pain, joint pain, back pain Colds, flu, sore throats, headache, allergies, cough, fever  Ear pain, sinus and eye infections Abdominal pain, nausea, vomiting, diarrhea, upset stomach Animal/insect bites  3 Easy Ways to Schedule: Walk-In Scheduling Call in scheduling Mychart Sign-up: [email or web address]         

## 2016-08-10 NOTE — Progress Notes (Signed)
Pre visit review using our clinic review tool, if applicable. No additional management support is needed unless otherwise documented below in the visit note. 

## 2016-08-10 NOTE — Telephone Encounter (Signed)
Received a form from [facility] for diabetic shoes.  Spoke with Dr. Sarajane Jews and he advised the papers be sent to pt's endo specialist.  Called [facility] and advised they re direct papers to Dr. Elayne Snare at endo.  They agreed to do so.  No further action needed.  Will now close the note.

## 2016-08-10 NOTE — Progress Notes (Signed)
   Subjective:    Patient ID: MACEDONIO SCALLON, male    DOB: [birth year], 46 y.o.   MRN: [id number]  HPI Here for elevated BP and worsening swelling. He has been taking Doxycycline for cellulitis on the lower legs, and this has actually responded well. However the swelling is worse such that his legs are always in pain and it is hard to walk. He is now swelling in the abdomen and the hands as well. No SOB.    Review of Systems  Constitutional: Negative.   Respiratory: Negative.   Cardiovascular: Positive for leg swelling. Negative for chest pain and palpitations.  Skin: Negative for color change.  Neurological: Negative.        Objective:   Physical Exam  Constitutional: He appears well-developed and well-nourished.  Cardiovascular: Normal rate, regular rhythm, normal heart sounds and intact distal pulses.   Pulmonary/Chest: Effort normal and breath sounds normal.  Musculoskeletal:  1+ edema in the hands and 4+ edema in both legs.           Assessment & Plan:  He is retaining a lot of fluid and this is making his BP go up. We will increase the Lasix 40 mg to take 2 tabs each morning and one each evening. Add potassium 10 mEq bid. Increase the Metoprolol to 100 mg bid. Increase the Clonidine to 0.2 mg TID. Refer to Cardiology to assist [place]. His cellulitis is much improved.  Alysia Penna, MD

## 2016-08-11 DIAGNOSIS — M545 Low back pain: Secondary | ICD-10-CM | POA: Diagnosis not present

## 2016-08-11 DIAGNOSIS — M9901 Segmental and somatic dysfunction of cervical region: Secondary | ICD-10-CM | POA: Diagnosis not present

## 2016-08-11 DIAGNOSIS — M9903 Segmental and somatic dysfunction of lumbar region: Secondary | ICD-10-CM | POA: Diagnosis not present

## 2016-08-11 DIAGNOSIS — M9900 Segmental and somatic dysfunction of head region: Secondary | ICD-10-CM | POA: Diagnosis not present

## 2016-08-14 MED FILL — traMADol HCL 50 MG TABS: 50 | 4 days supply | Qty: 16 | Fill #0

## 2016-08-14 MED FILL — CLINDAMYCIN HCL 150 MG CAPS: 150 | 7 days supply | Qty: 28 | Fill #0

## 2016-08-26 ENCOUNTER — Telehealth: Payer: Self-pay | Admitting: Cardiology

## 2016-08-26 DIAGNOSIS — M79671 Pain in right foot: Secondary | ICD-10-CM | POA: Diagnosis not present

## 2016-08-26 DIAGNOSIS — M79672 Pain in left foot: Secondary | ICD-10-CM | POA: Diagnosis not present

## 2016-08-26 NOTE — Telephone Encounter (Signed)
Spoke with DOD-Hochrein, he states that his last BP 150/84 is not incredible high and it is ok to wait until Friday to be seen by dr Stanford Breed.Spoke with pt appt scheduled for 08-28-16 @ 10am, he will call either Vasc md or PCP for further direction.

## 2016-08-26 NOTE — Telephone Encounter (Signed)
Spoke with pt he states that his BP has been running high for 2 weeks, since he went to his PCP. He states that he only has a few readings today @2pm  203/114 HR 84 yesterday AM 160/96 HR 90 and today at the vascular MD at [facility] 150/84 HR 82. He states that he is concerned denies any symptoms. He states that his BP gets high when he stands up. He states that he has some sort of LE scan done today(pt has LE lymphedema) at [facility] in [place] [phone]). I tried to call and get reading but they are closed will have to call again in the morning for the results.

## 2016-08-28 ENCOUNTER — Encounter: Payer: Self-pay | Admitting: Internal Medicine

## 2016-08-28 ENCOUNTER — Ambulatory Visit (INDEPENDENT_AMBULATORY_CARE_PROVIDER_SITE_OTHER): Payer: Medicare Other | Admitting: Internal Medicine

## 2016-08-28 VITALS — BP 152/80 | HR 62 | Ht 70.0 in | Wt 282.0 lb

## 2016-08-28 DIAGNOSIS — I10 Essential (primary) hypertension: Secondary | ICD-10-CM | POA: Diagnosis not present

## 2016-08-28 DIAGNOSIS — R0602 Shortness of breath: Secondary | ICD-10-CM

## 2016-08-28 DIAGNOSIS — R601 Generalized edema: Secondary | ICD-10-CM | POA: Diagnosis not present

## 2016-08-28 MED ORDER — METOLAZONE 2.5 MG PO TABS: 2.5000 mg | ORAL_TABLET | ORAL | 1 refills | Status: DC

## 2016-08-28 NOTE — Patient Instructions (Signed)
Medication Instructions:  START metolazone (zaroxolyn) 2.5mg  THREE TIMES WEEKLY. Take this medication 30-60 minutes before your morning dose of furosemide (Lasix).   Labwork: CMET, BNP in 1 week   Testing/Procedures: Your physician has requested that you have an echocardiogram. Echocardiography is a painless test that uses sound waves to create images of your heart. It provides your doctor with information about the size and shape of your heart and how well your heart's chambers and valves are working. This procedure takes approximately one hour. There are no restrictions for this procedure.   Follow-Up: in 2 weeks with Dr. Debara Pickett - (after echocardiogram)    If you need a refill on your cardiac medications before your next appointment, please call your pharmacy.

## 2016-08-28 NOTE — Progress Notes (Signed)
OFFICE CONSULT NOTE  Chief Complaint:  Anasarca, uncontrolled hypertension  Primary Care Physician: Alysia Penna, MD  HPI:  Alex Ryan is a 46 y.o. male who is being seen today for the evaluation of anasarca and uncontrolled hypertension at the request of Laurey Morale, MD. Although he was previously seen once by Dr. Stanford Breed in 2013, he is considered a new patient to our practice since is not been seen in the last 3 years. Alex Ryan was referred to me in consultation for evaluation of difficult to control edema. This is been worsening over the past several weeks to months. This necessitated increase in his diuretics. He had a recent hospitalization for sepsis picture (06/2015) with significant volume overload and required massive diuresis. Recently he's had difficult to control hypertension and significant upper and lower extremity edema. He saw his primary care provider to increase his diuretics. In addition he's had difficult to control hypertension requiring an increase in his medications. Amlodipine 5 mg daily, clonidine 0.2 mg 3 times a day, losartan 100 mg daily and Toprol-XL 100 mg daily. Blood pressure today is improved at 152/80. He says since his recent medication adjustments his blood pressure is better. He reports some improvement in his swelling with the Lasix but still has significant anasarca, mild scrotal edema and upper arm swelling. He says he can barely straighten his legs due to significant "woody stasis edema". He also has some blisters developing on the legs but are not weeping. He did have an echocardiogram when he was hospitalized showing an EF of 55-60% in February 2017, apparently right ventricular function was considered normal. Recent lab work was not necessarily remarkable except it is noted that he has a low albumin.  PMHx:  Past Medical History:  Diagnosis Date  . Allergic rhinitis   . Anxiety   . Asthma   . Bladder infection    in June -Was on Cipro  .  Cellulitis    legs - hx  . Chronic kidney disease   . CKD (chronic kidney disease), stage II   . Diabetes mellitus    sees Dr. Jacelyn Pi   . H/O hiatal hernia    pt thinks so  . Headache(784.0)    had migraine- not in years  . Heart murmur    Followed by Dr Stanford Breed  . Hyperlipidemia   . Hypertension   . Neuropathy in diabetes ([place])   . OSA (obstructive sleep apnea)    CPAP- not wearing  . Recurrent upper respiratory infection (URI)    frequent bronchcitis  . Retinopathy    right eye  . Retinopathy of both eyes    sees Dr. Zadie Rhine   . Shingles 2010    Past Surgical History:  Procedure Laterality Date  . EYE SURGERY  2012   Cataract with lens  Right  . PARS PLANA VITRECTOMY  12/04/2011   Procedure: PARS PLANA VITRECTOMY WITH 25 GAUGE;  Surgeon: Hurman Horn, MD;  Location: [place];  Service: Ophthalmology;  Laterality: Left;  Insertion Silicon Oil 7902 CS and    . PARS PLANA VITRECTOMY W/ SCLERAL BUCKLE  09/01/2011   Procedure: PARS PLANA VITRECTOMY WITH LASER FOR MACULAR HOLE;  Surgeon: Hurman Horn, MD;  Location: [place];  Service: Ophthalmology;  Laterality: Right;  Pars Plana Vitrectomy with Laser and Membrane Peel; Insertion Silicone Oil  . TONSILLECTOMY  1983  . VITRECTOMY  2013   per Dr. Zadie Rhine    FAMHx:  Family History  Problem Relation Age of Onset  . Diabetes Mother   . Transient ischemic attack Father   . Cancer Father   . Epilepsy Father   . Diabetes Maternal Uncle   . Diabetes Maternal Grandmother   . Heart disease Maternal Grandfather   . Epilepsy Child   . Asthma Child   . Anesthesia problems Neg Hx     SOCHx:   reports that he quit smoking about 7 years ago. He has a 8.70 pack-year smoking history. He has quit using smokeless tobacco. He reports that he does not drink alcohol or use drugs.  ALLERGIES:  Allergies  Allergen Reactions  . Codeine Hives and Nausea And Vomiting    Hyper  . Erythromycin Nausea And Vomiting  . Hibiclens  [Chlorhexidine] Itching  . Other     Pre-op cleaning wipes:. itching  . Soap Itching    "dial soap"  . Vancomycin Swelling    Patient reports severe bilateral upper extremity swelling with vancomycin  . Keflex [Cephalexin] Rash    ROS: Pertinent items noted in HPI and remainder of comprehensive ROS otherwise negative.  HOME MEDS: Current Outpatient Prescriptions on File Prior to Visit  Medication Sig Dispense Refill  . albuterol (PROVENTIL HFA;VENTOLIN HFA) 108 (90 Base) MCG/ACT inhaler Inhale 2 puffs into the lungs every 4 (four) hours as needed for wheezing or shortness of breath. PRN:  Allergies/breathing 1 Inhaler 5  . Alcohol Swabs PADS Use up to 10 times per day for testing and insulin injections, diagnosis code is 250.00 300 each 3  . ALPRAZolam (XANAX) 0.25 MG tablet Take 1 tablet (0.25 mg total) by mouth 2 (two) times daily as needed for anxiety. 60 tablet 2  . amLODipine (NORVASC) 5 MG tablet Take 1 tablet (5 mg total) by mouth daily at 2 PM. 90 tablet 3  . aspirin 325 MG tablet Take 325 mg by mouth daily. Reported on 05/29/2015    . cloNIDine (CATAPRES) 0.2 MG tablet Take 1 tablet (0.2 mg total) by mouth 3 (three) times daily. 90 tablet 5  . FLUoxetine (PROZAC) 40 MG capsule Take 1 capsule (40 mg total) by mouth daily. 30 capsule 5  . furosemide (LASIX) 40 MG tablet Take 2 tabs in the morning and one tab in the evening 90 tablet 5  . glucose blood (FREESTYLE LITE) test strip Use to test blood sugar 3 times daily 100 each 12  . insulin NPH Human (HUMULIN N) 100 UNIT/ML injection Inject 0.4 mLs (40 Units total) into the skin 2 (two) times daily before a meal. 20 mL 2  . insulin regular (HUMULIN R) 100 units/mL injection 10 units in the morning, 10 at lunch, and 6 in the evening 20 mL 2  . Insulin Syringe-Needle U-100 (INSULIN SYRINGE 1CC/31GX5/16") 31G X 5/16" 1 ML MISC 3 times daily 100 each 1  . loratadine (CLARITIN) 10 MG tablet Take 10 mg by mouth daily.    Marland Kitchen losartan  (COZAAR) 100 MG tablet Take 1 tablet (100 mg total) by mouth daily. 30 tablet 11  . metoprolol succinate (TOPROL-XL) 100 MG 24 hr tablet Take 1 tablet (100 mg total) by mouth 2 (two) times daily. Take with or immediately following a meal. 60 tablet 5  . potassium chloride (KLOR-CON 10) 10 MEQ tablet Take 1 tablet (10 mEq total) by mouth 2 (two) times daily. 60 tablet 5   No current facility-administered medications on file prior to visit.     LABS/IMAGING: No results found for this or any  previous visit (from the past 48 hour(s)). No results found.  WEIGHTS: Wt Readings from Last 3 Encounters:  08/28/16 282 lb (127.9 kg)  08/10/16 281 lb (127.5 kg)  06/05/16 259 lb (117.5 kg)    VITALS: BP (!) 152/80 (BP Location: Right Arm, Patient Position: Sitting, Cuff Size: Large)   Pulse 62   Ht 5\' 10"  (1.778 m)   Wt 282 lb (127.9 kg)   BMI 40.46 kg/m   EXAM: General appearance: alert, no distress and morbidly obese Neck: no carotid bruit and no JVD Lungs: diminished breath sounds bibasilar Heart: regular rate and rhythm Abdomen: Protuberant, firm, nontender Extremities: varicose veins noted Pulses: 2+ and symmetric Skin: Skin color, texture, turgor normal. No rashes or lesions Neurologic: Grossly normal Psych: Pleasant  EKG: Deferred  ASSESSMENT: 1. Anasarca - ?related to right heart failure, hypoalbuminemia/third spacing, liver disease, lymphedema? 2. Hypertension 3. Morbid obesity 4. IDDM  PLAN: 1.   Alex Ryan has significant volume overload and anasarca. He is likely carrying more than 30 pounds of fluid. Despite it recent increase in his diuretics he has not had significant improvement in his swelling. I would continue his current dose of Lasix 40 mg twice a day but add metolazone 2.5 mg 3 times weekly a possibly 30-60 minutes prior to his morning dose. Hopefully this will provide additional diuresis. We'll need to check a repeat metabolic profile and BNP as well as albumin  next week. Creatinine had been elevated in October however normalized in November. It will be important to see if he is able to tolerate an increased dose of diuretics. I do think we need to think of other causes of his anasarca which could be related to low albumin, question nephropathy - may need to consider 24 hour urine study. With regards to blood pressure, it is improved, however, he is requiring a significant amount of medication. Hopefully with diuresis this will improve - amlodipine may be contributing to swelling. We could consider a more potent ARB or switching Toprol to Bystolic which has better blood pressure lowering ability - will address this when he returns. Check a repeat 2D echo since his last study was over 1 year ago.  Thanks for referring Alex Ryan for cardiac consultation. I'll keep you informed of my findings.  Pixie Casino, MD, [facility] Attending Cardiologist [place] 08/28/2016, 12:08 PM

## 2016-09-07 DIAGNOSIS — R601 Generalized edema: Secondary | ICD-10-CM | POA: Diagnosis not present

## 2016-09-07 LAB — COMPREHENSIVE METABOLIC PANEL
ALBUMIN: 2.5 g/dL — AB (ref 3.6–5.1)
ALK PHOS: 191 U/L — AB (ref 40–115)
ALT: 10 U/L (ref 9–46)
AST: 11 U/L (ref 10–40)
BILIRUBIN TOTAL: 0.5 mg/dL (ref 0.2–1.2)
BUN: 30 mg/dL — ABNORMAL HIGH (ref 7–25)
CALCIUM: 8.5 mg/dL — AB (ref 8.6–10.3)
CO2: 32 mmol/L — ABNORMAL HIGH (ref 20–31)
Chloride: 94 mmol/L — ABNORMAL LOW (ref 98–110)
Creat: 1.57 mg/dL — ABNORMAL HIGH (ref 0.60–1.35)
GLUCOSE: 458 mg/dL — AB (ref 65–99)
Potassium: 5.8 mmol/L — ABNORMAL HIGH (ref 3.5–5.3)
Sodium: 133 mmol/L — ABNORMAL LOW (ref 135–146)
Total Protein: 5.7 g/dL — ABNORMAL LOW (ref 6.1–8.1)

## 2016-09-07 LAB — BRAIN NATRIURETIC PEPTIDE: Brain Natriuretic Peptide: 94.2 pg/mL (ref <100)

## 2016-09-08 ENCOUNTER — Telehealth: Payer: Self-pay | Admitting: Internal Medicine

## 2016-09-08 DIAGNOSIS — E875 Hyperkalemia: Secondary | ICD-10-CM

## 2016-09-08 DIAGNOSIS — Z79899 Other long term (current) drug therapy: Secondary | ICD-10-CM

## 2016-09-08 DIAGNOSIS — M9900 Segmental and somatic dysfunction of head region: Secondary | ICD-10-CM | POA: Diagnosis not present

## 2016-09-08 DIAGNOSIS — M545 Low back pain: Secondary | ICD-10-CM | POA: Diagnosis not present

## 2016-09-08 DIAGNOSIS — M9903 Segmental and somatic dysfunction of lumbar region: Secondary | ICD-10-CM | POA: Diagnosis not present

## 2016-09-08 DIAGNOSIS — M9901 Segmental and somatic dysfunction of cervical region: Secondary | ICD-10-CM | POA: Diagnosis not present

## 2016-09-08 NOTE — Telephone Encounter (Signed)
Patient aware of results Patient states he went from 282lbs to 259lbs Swelling has improved greatly! Will d/c potassium supplement & avoid potassium-rich foods Will have repeat BMET @ [facility] in [place] on Friday

## 2016-09-08 NOTE — Telephone Encounter (Signed)
Follow up message     Reference [id number]

## 2016-09-08 NOTE — Telephone Encounter (Signed)
New Message     Stat lab results

## 2016-09-08 NOTE — Telephone Encounter (Signed)
MD reviewed results on 4/23 pm LMTCB  Notes recorded by Pixie Casino, MD on 09/07/2016 at 7:59 PM EDT Creatinine is elevated - but similar to 5 months ago. Potassium is high - DISCONTINUE POTASSIUM SUPPLEMENTS IMMEDIATELY - provide instruction about avoiding potassium containing foods in diet. ?Has his weight come down? Has the swelling improved. If yes, then we may continue lasix and metolazone and recheck a BMET on Friday (4/27). If swelling not much different or weight the same or higher, then would d/c metolazone and continue lasix.  Dr. Lemmie Evens

## 2016-09-08 NOTE — Telephone Encounter (Signed)
Blood glucose 458 MD has already reviewed results and patient is aware of MD instructions

## 2016-09-08 NOTE — Telephone Encounter (Signed)
New message ° ° °Stat lab results  °

## 2016-09-10 ENCOUNTER — Ambulatory Visit (HOSPITAL_COMMUNITY): Payer: Medicare Other | Attending: Cardiovascular Disease

## 2016-09-10 ENCOUNTER — Other Ambulatory Visit: Payer: Self-pay

## 2016-09-10 DIAGNOSIS — I071 Rheumatic tricuspid insufficiency: Secondary | ICD-10-CM | POA: Diagnosis not present

## 2016-09-10 DIAGNOSIS — G4733 Obstructive sleep apnea (adult) (pediatric): Secondary | ICD-10-CM | POA: Insufficient documentation

## 2016-09-10 DIAGNOSIS — E119 Type 2 diabetes mellitus without complications: Secondary | ICD-10-CM | POA: Insufficient documentation

## 2016-09-10 DIAGNOSIS — R0602 Shortness of breath: Secondary | ICD-10-CM | POA: Diagnosis not present

## 2016-09-10 DIAGNOSIS — R601 Generalized edema: Secondary | ICD-10-CM | POA: Diagnosis not present

## 2016-09-10 DIAGNOSIS — I10 Essential (primary) hypertension: Secondary | ICD-10-CM | POA: Insufficient documentation

## 2016-09-10 DIAGNOSIS — Z87891 Personal history of nicotine dependence: Secondary | ICD-10-CM | POA: Diagnosis not present

## 2016-09-10 MED ORDER — PERFLUTREN LIPID MICROSPHERE
1.0000 mL | INTRAVENOUS | Status: AC | PRN
  Administered 2016-09-10: 2 mL via INTRAVENOUS

## 2016-09-11 DIAGNOSIS — E875 Hyperkalemia: Secondary | ICD-10-CM | POA: Diagnosis not present

## 2016-09-11 DIAGNOSIS — Z79899 Other long term (current) drug therapy: Secondary | ICD-10-CM | POA: Diagnosis not present

## 2016-09-12 ENCOUNTER — Telehealth: Payer: Self-pay | Admitting: Internal Medicine

## 2016-09-12 LAB — BASIC METABOLIC PANEL
BUN/Creatinine Ratio: 21 — ABNORMAL HIGH (ref 9–20)
BUN: 28 mg/dL — AB (ref 6–24)
CALCIUM: 8.9 mg/dL (ref 8.7–10.2)
CHLORIDE: 84 mmol/L — AB (ref 96–106)
CO2: 28 mmol/L (ref 18–29)
Creatinine, Ser: 1.34 mg/dL — ABNORMAL HIGH (ref 0.76–1.27)
GFR calc Af Amer: 73 mL/min/{1.73_m2} (ref >59)
GFR calc non Af Amer: 63 mL/min/{1.73_m2} (ref >59)
GLUCOSE: 760 mg/dL — AB (ref 65–99)
POTASSIUM: 5.1 mmol/L (ref 3.5–5.2)
Sodium: 126 mmol/L — ABNORMAL LOW (ref 134–144)

## 2016-09-12 NOTE — Telephone Encounter (Signed)
Called overnight with abnormal lab of glucose 760; called patient, no answer, left message with information and to treat if not already done so

## 2016-09-14 ENCOUNTER — Telehealth: Payer: Self-pay | Admitting: Internal Medicine

## 2016-09-14 NOTE — Telephone Encounter (Signed)
New message    Pt is calling about call he got over the weekend regarding his lab work.

## 2016-09-14 NOTE — Telephone Encounter (Signed)
Returned call to patient He states he got a call at 2am about his high glucose level He states he feels fine Advised on rest of lab results and echo results Patient has MD OV 09/17/16

## 2016-09-17 ENCOUNTER — Encounter: Payer: Self-pay | Admitting: Internal Medicine

## 2016-09-17 ENCOUNTER — Ambulatory Visit (INDEPENDENT_AMBULATORY_CARE_PROVIDER_SITE_OTHER): Payer: Medicare Other | Admitting: Internal Medicine

## 2016-09-17 VITALS — BP 138/78 | HR 80 | Ht 70.0 in | Wt 235.4 lb

## 2016-09-17 DIAGNOSIS — I10 Essential (primary) hypertension: Secondary | ICD-10-CM

## 2016-09-17 DIAGNOSIS — R0602 Shortness of breath: Secondary | ICD-10-CM | POA: Diagnosis not present

## 2016-09-17 DIAGNOSIS — R809 Proteinuria, unspecified: Secondary | ICD-10-CM

## 2016-09-17 DIAGNOSIS — E1065 Type 1 diabetes mellitus with hyperglycemia: Secondary | ICD-10-CM

## 2016-09-17 DIAGNOSIS — E1029 Type 1 diabetes mellitus with other diabetic kidney complication: Secondary | ICD-10-CM

## 2016-09-17 DIAGNOSIS — N049 Nephrotic syndrome with unspecified morphologic changes: Secondary | ICD-10-CM | POA: Insufficient documentation

## 2016-09-17 DIAGNOSIS — IMO0002 Reserved for concepts with insufficient information to code with codable children: Secondary | ICD-10-CM

## 2016-09-17 NOTE — Patient Instructions (Addendum)
You have been referred to Dr. Edrick Oh @ [facility] wants you to follow-up in: 6 months with Dr. Debara Pickett. You will receive a reminder letter in the mail two months in advance. If you don't receive a letter, please call our office to schedule the follow-up appointment.

## 2016-09-17 NOTE — Progress Notes (Signed)
OFFICE CONSULT NOTE  Chief Complaint:  Markedly improved swelling  Primary Care Physician: Alysia Penna, MD  HPI:  Alex Ryan is a 46 y.o. male who is being seen today for the evaluation of anasarca and uncontrolled hypertension at the request of Laurey Morale, MD. Although he was previously seen once by Dr. Stanford Breed in 2013, he is considered a new patient to our practice since is not been seen in the last 3 years. Alex Ryan was referred to me in consultation for evaluation of difficult to control edema. This is been worsening over the past several weeks to months. This necessitated increase in his diuretics. He had a recent hospitalization for sepsis picture (06/2015) with significant volume overload and required massive diuresis. Recently he's had difficult to control hypertension and significant upper and lower extremity edema. He saw his primary care provider to increase his diuretics. In addition he's had difficult to control hypertension requiring an increase in his medications. Amlodipine 5 mg daily, clonidine 0.2 mg 3 times a day, losartan 100 mg daily and Toprol-XL 100 mg daily. Blood pressure today is improved at 152/80. He says since his recent medication adjustments his blood pressure is better. He reports some improvement in his swelling with the Lasix but still has significant anasarca, mild scrotal edema and upper arm swelling. He says he can barely straighten his legs due to significant "woody stasis edema". He also has some blisters developing on the legs but are not weeping. He did have an echocardiogram when he was hospitalized showing an EF of 55-60% in February 2017, apparently right ventricular function was considered normal. Recent lab work was not necessarily remarkable except it is noted that he has a low albumin.  09/17/2016  Alex Ryan returns today for follow-up. He's had marked diuresis with the addition of metolazone to his Lasix. Over the past 3 weeks his weight is  decreased from 282 pounds 235 pounds. We followed his creatinine closely which initially started to rise including the development of hyperkalemia. We stopped his potassium supplements and his numbers have improved. Creatinine is also come down to 1.34 with potassium of 5.1. His glucose on recent lab work was greater than 760 however he reports fingerstick glucose checks or not that high. He has a follow-up with his primary care provider for further adjustments in his insulin. We did perform an echocardiogram which demonstrated normal systolic and very mild diastolic dysfunction with normal right heart function and no clear explanation for his significant edema. As noted previously does have a low serum albumin and I reviewed lab work from 07/2015 which showed a urine microalbumin of 365 and urine creatinine of 96, which suggests a urine albumin to creatinine ratio of 3.8 or close to 4 g of protein when extrapolated to 24 hour period - this suggests likely nephrotic syndrome. He now reports he started to have some cramping in his hands.  PMHx:  Past Medical History:  Diagnosis Date  . Allergic rhinitis   . Anxiety   . Asthma   . Bladder infection    in June -Was on Cipro  . Cellulitis    legs - hx  . Chronic kidney disease   . CKD (chronic kidney disease), stage II   . Diabetes mellitus    sees Dr. Jacelyn Pi   . H/O hiatal hernia    pt thinks so  . Headache(784.0)    had migraine- not in years  . Heart murmur    Followed  by Dr Stanford Breed  . Hyperlipidemia   . Hypertension   . Neuropathy in diabetes ([place])   . OSA (obstructive sleep apnea)    CPAP- not wearing  . Recurrent upper respiratory infection (URI)    frequent bronchcitis  . Retinopathy    right eye  . Retinopathy of both eyes    sees Dr. Zadie Rhine   . Shingles 2010    Past Surgical History:  Procedure Laterality Date  . EYE SURGERY  2012   Cataract with lens  Right  . PARS PLANA VITRECTOMY  12/04/2011   Procedure: PARS  PLANA VITRECTOMY WITH 25 GAUGE;  Surgeon: Hurman Horn, MD;  Location: [place];  Service: Ophthalmology;  Laterality: Left;  Insertion Silicon Oil 6195 CS and    . PARS PLANA VITRECTOMY W/ SCLERAL BUCKLE  09/01/2011   Procedure: PARS PLANA VITRECTOMY WITH LASER FOR MACULAR HOLE;  Surgeon: Hurman Horn, MD;  Location: [place];  Service: Ophthalmology;  Laterality: Right;  Pars Plana Vitrectomy with Laser and Membrane Peel; Insertion Silicone Oil  . TONSILLECTOMY  1983  . VITRECTOMY  2013   per Dr. Zadie Rhine    FAMHx:  Family History  Problem Relation Age of Onset  . Diabetes Mother   . Transient ischemic attack Father   . Cancer Father   . Epilepsy Father   . Diabetes Maternal Uncle   . Diabetes Maternal Grandmother   . Heart disease Maternal Grandfather   . Epilepsy Child   . Asthma Child   . Anesthesia problems Neg Hx     SOCHx:   reports that he quit smoking about 7 years ago. He has a 8.70 pack-year smoking history. He has quit using smokeless tobacco. He reports that he does not drink alcohol or use drugs.  ALLERGIES:  Allergies  Allergen Reactions  . Codeine Hives and Nausea And Vomiting    Hyper  . Erythromycin Nausea And Vomiting  . Hibiclens [Chlorhexidine] Itching  . Keflex [Cephalexin] Rash  . Other Hives    Pre-op cleaning wipes:. itching  . Soap Itching    "dial soap"  . Vancomycin Swelling    Patient reports severe bilateral upper extremity swelling with vancomycin    ROS: Pertinent items noted in HPI and remainder of comprehensive ROS otherwise negative.  HOME MEDS: Current Outpatient Prescriptions on File Prior to Visit  Medication Sig Dispense Refill  . albuterol (PROVENTIL HFA;VENTOLIN HFA) 108 (90 Base) MCG/ACT inhaler Inhale 2 puffs into the lungs every 4 (four) hours as needed for wheezing or shortness of breath. PRN:  Allergies/breathing 1 Inhaler 5  . Alcohol Swabs PADS Use up to 10 times per day for testing and insulin injections, diagnosis code is  250.00 300 each 3  . ALPRAZolam (XANAX) 0.25 MG tablet Take 1 tablet (0.25 mg total) by mouth 2 (two) times daily as needed for anxiety. 60 tablet 2  . amLODipine (NORVASC) 5 MG tablet Take 1 tablet (5 mg total) by mouth daily at 2 PM. 90 tablet 3  . aspirin 325 MG tablet Take 325 mg by mouth daily. Reported on 05/29/2015    . cloNIDine (CATAPRES) 0.2 MG tablet Take 1 tablet (0.2 mg total) by mouth 3 (three) times daily. 90 tablet 5  . FLUoxetine (PROZAC) 40 MG capsule Take 1 capsule (40 mg total) by mouth daily. 30 capsule 5  . furosemide (LASIX) 40 MG tablet Take 2 tabs in the morning and one tab in the evening 90 tablet 5  . glucose  blood (FREESTYLE LITE) test strip Use to test blood sugar 3 times daily 100 each 12  . insulin NPH Human (HUMULIN N) 100 UNIT/ML injection Inject 0.4 mLs (40 Units total) into the skin 2 (two) times daily before a meal. 20 mL 2  . insulin regular (HUMULIN R) 100 units/mL injection 10 units in the morning, 10 at lunch, and 6 in the evening 20 mL 2  . Insulin Syringe-Needle U-100 (INSULIN SYRINGE 1CC/31GX5/16") 31G X 5/16" 1 ML MISC 3 times daily 100 each 1  . loratadine (CLARITIN) 10 MG tablet Take 10 mg by mouth daily.    Marland Kitchen losartan (COZAAR) 100 MG tablet Take 1 tablet (100 mg total) by mouth daily. 30 tablet 11  . metolazone (ZAROXOLYN) 2.5 MG tablet Take 1 tablet (2.5 mg total) by mouth 3 (three) times a week. Take 30-60 mins prior to your AM lasix dose. 30 tablet 1  . metoprolol succinate (TOPROL-XL) 100 MG 24 hr tablet Take 1 tablet (100 mg total) by mouth 2 (two) times daily. Take with or immediately following a meal. 60 tablet 5   No current facility-administered medications on file prior to visit.     LABS/IMAGING: No results found for this or any previous visit (from the past 48 hour(s)). No results found.  WEIGHTS: Wt Readings from Last 3 Encounters:  09/17/16 235 lb 6.4 oz (106.8 kg)  08/28/16 282 lb (127.9 kg)  08/10/16 281 lb (127.5 kg)     VITALS: BP 138/78 (BP Location: Right Arm, Patient Position: Sitting, Cuff Size: Normal)   Pulse 80   Ht 5\' 10"  (1.778 m)   Wt 235 lb 6.4 oz (106.8 kg)   BMI 33.78 kg/m   EXAM: General appearance: alert, no distress and mildly obese Lungs: clear to auscultation bilaterally Heart: regular rate and rhythm Extremities: extremities normal, atraumatic, no cyanosis or edema Neurologic: Grossly normal  EKG: Deferred  ASSESSMENT: 1. Anasarca - likely related to nephrotic syndrome, LVEF 93-26%, mild diastolic dysfunction 2. Hypertension 3. Morbid obesity 4. IDDM  PLAN: 1.   Alex Ryan has had marked diuresis over the past 3 weeks with greater than 50 pound weight loss. He reports marked improvement in shortness of breath, normalization of his blood pressure and significant improvement in lower extremity edema. His creatinine is also improved somewhat. He had some hyperkalemia however potassium supplements were discontinued and this improved. Looking back a year ago he had near nephrotic range proteinuria. I suspect with his long-standing uncontrolled diabetes that he has significant renal dysfunction. He is now starting to have some hand cramping and advised to discontinue his metolazone and remain on Lasix 80 mg twice a day. I'll refer him to [facility] for further evaluation - he has requested Dr. Edrick Oh who is his mother's nephrologist.  Follow-up with me in 6 months.  Pixie Casino, MD, [facility] Attending Cardiologist [place] 09/17/2016, 2:22 PM

## 2016-09-18 ENCOUNTER — Encounter: Payer: Self-pay | Admitting: Family Medicine

## 2016-09-18 ENCOUNTER — Ambulatory Visit (INDEPENDENT_AMBULATORY_CARE_PROVIDER_SITE_OTHER): Payer: Medicare Other | Admitting: Family Medicine

## 2016-09-18 VITALS — BP 142/70 | HR 104 | Temp 100.3°F | Wt 239.2 lb

## 2016-09-18 DIAGNOSIS — I10 Essential (primary) hypertension: Secondary | ICD-10-CM

## 2016-09-18 DIAGNOSIS — R809 Proteinuria, unspecified: Secondary | ICD-10-CM

## 2016-09-18 DIAGNOSIS — E1065 Type 1 diabetes mellitus with hyperglycemia: Secondary | ICD-10-CM | POA: Diagnosis not present

## 2016-09-18 DIAGNOSIS — E1029 Type 1 diabetes mellitus with other diabetic kidney complication: Secondary | ICD-10-CM

## 2016-09-18 DIAGNOSIS — N049 Nephrotic syndrome with unspecified morphologic changes: Secondary | ICD-10-CM

## 2016-09-18 DIAGNOSIS — IMO0002 Reserved for concepts with insufficient information to code with codable children: Secondary | ICD-10-CM

## 2016-09-18 DIAGNOSIS — B349 Viral infection, unspecified: Secondary | ICD-10-CM | POA: Diagnosis not present

## 2016-09-18 MED ORDER — PROMETHAZINE HCL 25 MG/ML IJ SOLN
25.0000 mg | Freq: Once | INTRAMUSCULAR | Status: AC
  Administered 2016-09-18: 25 mg via INTRAMUSCULAR

## 2016-09-18 MED ORDER — HYDROCODONE-HOMATROPINE 5-1.5 MG/5ML PO SYRP: 5.0000 mL | ORAL_SOLUTION | ORAL | 0 refills | Status: DC | PRN

## 2016-09-18 MED ORDER — ONDANSETRON HCL 8 MG PO TABS: 8.0000 mg | ORAL_TABLET | Freq: Three times a day (TID) | ORAL | 2 refills | Status: DC | PRN

## 2016-09-18 NOTE — Progress Notes (Signed)
   Subjective:    Patient ID: Alex Ryan, male    DOB: 29-Jul-1970, 46 y.o.   MRN: [id number]  HPI Here for 24 hours of nausea and vomiting, low grade fevers, ST, and a dry cough. He has not eaten today and he has not been able to keep fluids down for the most part. He has been struggling with while body edema and ansarca for several months. He has seen Dr. Debara Pickett who diagnosed him with nephrotic syndrome and he has been able to diurese 50 lbs of fluid with a combination of Metolazone and Lasix. He was seen yesterday and Dr. Debara Pickett stopped the Metolazone and kept him on Lasix 80 mg bid. He also referred him to Dr. Edrick Oh at [facility].    Review of Systems  Constitutional: Negative.   Respiratory: Negative.   Cardiovascular: Negative.   Gastrointestinal: Positive for nausea and vomiting. Negative for abdominal distention, abdominal pain, blood in stool, constipation and diarrhea.  Genitourinary: Negative.   Neurological: Negative.        Objective:   Physical Exam  Constitutional: He is oriented to person, place, and time.  Appears ill, retching   Cardiovascular: Normal rate, regular rhythm, normal heart sounds and intact distal pulses.   Pulmonary/Chest: Effort normal and breath sounds normal. No respiratory distress. He has no wheezes. He has no rales.  Abdominal: Soft. Bowel sounds are normal. He exhibits no distension and no mass. There is no tenderness. There is no rebound and no guarding.  Neurological: He is alert and oriented to person, place, and time.          Assessment & Plan:  He has a viral illness and we will give him a shot of Phenergan for the nausea. He will use Zofran at home. Drink fluids as much as possible. Recheck prn. He will follow up with Dr. Justin Mend and Dr. Debara Pickett as above.  Alysia Penna, MD

## 2016-09-18 NOTE — Progress Notes (Signed)
Pre visit review using our clinic review tool, if applicable. No additional management support is needed unless otherwise documented below in the visit note. 

## 2016-09-18 NOTE — Patient Instructions (Signed)
WE NOW OFFER   [facility] FAST TRACK!!!  SAME DAY Appointments for ACUTE CARE  Such as: Sprains, Injuries, cuts, abrasions, rashes, muscle pain, joint pain, back pain Colds, flu, sore throats, headache, allergies, cough, fever  Ear pain, sinus and eye infections Abdominal pain, nausea, vomiting, diarrhea, upset stomach Animal/insect bites  3 Easy Ways to Schedule: Walk-In Scheduling Call in scheduling Mychart Sign-up: [email or web address]         

## 2016-09-22 ENCOUNTER — Encounter: Payer: Self-pay | Admitting: Family Medicine

## 2016-09-22 ENCOUNTER — Ambulatory Visit (INDEPENDENT_AMBULATORY_CARE_PROVIDER_SITE_OTHER): Payer: Medicare Other | Admitting: Family Medicine

## 2016-09-22 VITALS — BP 127/71 | HR 66 | Temp 98.1°F | Ht 70.0 in | Wt 233.0 lb

## 2016-09-22 DIAGNOSIS — N179 Acute kidney failure, unspecified: Secondary | ICD-10-CM | POA: Diagnosis not present

## 2016-09-22 DIAGNOSIS — E1169 Type 2 diabetes mellitus with other specified complication: Secondary | ICD-10-CM | POA: Diagnosis not present

## 2016-09-22 DIAGNOSIS — M86171 Other acute osteomyelitis, right ankle and foot: Secondary | ICD-10-CM | POA: Diagnosis not present

## 2016-09-22 DIAGNOSIS — L97519 Non-pressure chronic ulcer of other part of right foot with unspecified severity: Secondary | ICD-10-CM | POA: Diagnosis not present

## 2016-09-22 DIAGNOSIS — D631 Anemia in chronic kidney disease: Secondary | ICD-10-CM | POA: Diagnosis not present

## 2016-09-22 DIAGNOSIS — R269 Unspecified abnormalities of gait and mobility: Secondary | ICD-10-CM | POA: Diagnosis not present

## 2016-09-22 DIAGNOSIS — L02611 Cutaneous abscess of right foot: Secondary | ICD-10-CM | POA: Diagnosis not present

## 2016-09-22 DIAGNOSIS — L03031 Cellulitis of right toe: Secondary | ICD-10-CM

## 2016-09-22 DIAGNOSIS — L97514 Non-pressure chronic ulcer of other part of right foot with necrosis of bone: Secondary | ICD-10-CM | POA: Diagnosis not present

## 2016-09-22 DIAGNOSIS — Z794 Long term (current) use of insulin: Secondary | ICD-10-CM | POA: Diagnosis not present

## 2016-09-22 DIAGNOSIS — J45909 Unspecified asthma, uncomplicated: Secondary | ICD-10-CM | POA: Diagnosis not present

## 2016-09-22 DIAGNOSIS — Z89421 Acquired absence of other right toe(s): Secondary | ICD-10-CM | POA: Diagnosis not present

## 2016-09-22 DIAGNOSIS — L089 Local infection of the skin and subcutaneous tissue, unspecified: Secondary | ICD-10-CM | POA: Diagnosis not present

## 2016-09-22 DIAGNOSIS — E1151 Type 2 diabetes mellitus with diabetic peripheral angiopathy without gangrene: Secondary | ICD-10-CM | POA: Diagnosis not present

## 2016-09-22 DIAGNOSIS — S91301A Unspecified open wound, right foot, initial encounter: Secondary | ICD-10-CM | POA: Diagnosis not present

## 2016-09-22 DIAGNOSIS — K0510 Chronic gingivitis, plaque induced: Secondary | ICD-10-CM

## 2016-09-22 DIAGNOSIS — N183 Chronic kidney disease, stage 3 (moderate): Secondary | ICD-10-CM | POA: Diagnosis not present

## 2016-09-22 DIAGNOSIS — E11628 Type 2 diabetes mellitus with other skin complications: Secondary | ICD-10-CM | POA: Diagnosis not present

## 2016-09-22 DIAGNOSIS — B349 Viral infection, unspecified: Secondary | ICD-10-CM | POA: Diagnosis not present

## 2016-09-22 DIAGNOSIS — D649 Anemia, unspecified: Secondary | ICD-10-CM | POA: Diagnosis not present

## 2016-09-22 DIAGNOSIS — I129 Hypertensive chronic kidney disease with stage 1 through stage 4 chronic kidney disease, or unspecified chronic kidney disease: Secondary | ICD-10-CM | POA: Diagnosis not present

## 2016-09-22 DIAGNOSIS — M869 Osteomyelitis, unspecified: Secondary | ICD-10-CM | POA: Diagnosis not present

## 2016-09-22 DIAGNOSIS — E871 Hypo-osmolality and hyponatremia: Secondary | ICD-10-CM | POA: Diagnosis not present

## 2016-09-22 DIAGNOSIS — E1165 Type 2 diabetes mellitus with hyperglycemia: Secondary | ICD-10-CM | POA: Diagnosis not present

## 2016-09-22 DIAGNOSIS — E11621 Type 2 diabetes mellitus with foot ulcer: Secondary | ICD-10-CM | POA: Diagnosis not present

## 2016-09-22 DIAGNOSIS — R601 Generalized edema: Secondary | ICD-10-CM | POA: Diagnosis not present

## 2016-09-22 DIAGNOSIS — E1121 Type 2 diabetes mellitus with diabetic nephropathy: Secondary | ICD-10-CM | POA: Diagnosis not present

## 2016-09-22 DIAGNOSIS — E1122 Type 2 diabetes mellitus with diabetic chronic kidney disease: Secondary | ICD-10-CM | POA: Diagnosis not present

## 2016-09-22 DIAGNOSIS — G4733 Obstructive sleep apnea (adult) (pediatric): Secondary | ICD-10-CM | POA: Diagnosis not present

## 2016-09-22 DIAGNOSIS — E1142 Type 2 diabetes mellitus with diabetic polyneuropathy: Secondary | ICD-10-CM | POA: Diagnosis not present

## 2016-09-22 MED ORDER — DOXYCYCLINE HYCLATE 100 MG PO CAPS: 100.0000 mg | ORAL_CAPSULE | Freq: Two times a day (BID) | ORAL | 0 refills | Status: AC

## 2016-09-22 MED ORDER — CLINDAMYCIN HCL 300 MG PO CAPS: 300.0000 mg | ORAL_CAPSULE | Freq: Four times a day (QID) | ORAL | 0 refills | Status: DC

## 2016-09-22 NOTE — Patient Instructions (Signed)
WE NOW OFFER   [facility] FAST TRACK!!!  SAME DAY Appointments for ACUTE CARE  Such as: Sprains, Injuries, cuts, abrasions, rashes, muscle pain, joint pain, back pain Colds, flu, sore throats, headache, allergies, cough, fever  Ear pain, sinus and eye infections Abdominal pain, nausea, vomiting, diarrhea, upset stomach Animal/insect bites  3 Easy Ways to Schedule: Walk-In Scheduling Call in scheduling Mychart Sign-up: [email or web address]         

## 2016-09-22 NOTE — Progress Notes (Signed)
   Subjective:    Patient ID: Alex Ryan, male    DOB: 1970/11/25, 46 y.o.   MRN: [id number]  HPI Here to follow up a visit here on 09-18-16 for a viral illness that was causing fever wit nausea and vomiting. He also has a new problem to discuss. The fevers and the vomiting stopped, though he is still nauseated at times. However overt he weekend he started to have pain and drainage from his upper gums, so yesterday he saw his dentist. He diagnosed a gum infection and started Orvel of Clindamycin 300 mg QID. His gums seem ot be a bit improved today. However he also tells me that they have been dressing a blister on the right second toe for a week, but that last night it began to have a foul odor and have some yellow drainage.    Review of Systems  Constitutional: Negative.   Respiratory: Negative.   Cardiovascular: Negative.   Gastrointestinal: Positive for nausea. Negative for abdominal distention, abdominal pain, constipation, diarrhea and vomiting.  Skin: Positive for wound.       Objective:   Physical Exam  Constitutional: He is oriented to person, place, and time. He appears well-developed and well-nourished.  Cardiovascular: Normal rate, regular rhythm, normal heart sounds and intact distal pulses.   Pulmonary/Chest: Effort normal and breath sounds normal.  Abdominal: Soft. Bowel sounds are normal. He exhibits no distension and no mass. There is no tenderness. There is no rebound and no guarding.  Neurological: He is alert and oriented to person, place, and time.  Skin:  The right second toe is necrotic on the tip, there is yellow drainage coming from it, and the toe has a foul odor. No tenderness.           Assessment & Plan:  He is recovering from a viral illness. He is seeing his dentist to treat a gum infection. He now has a necrotic toe. He is already taking Clindamycin, and we will add Doxycycline to this. We will refer him urgently to his podiatrist Marylyn Ishihara [facility] DPM in  [place]). He will clean the toe and dress it daily. Alysia Penna, MD

## 2016-09-28 ENCOUNTER — Ambulatory Visit: Payer: Medicare Other | Admitting: Cardiology

## 2016-10-05 DIAGNOSIS — I129 Hypertensive chronic kidney disease with stage 1 through stage 4 chronic kidney disease, or unspecified chronic kidney disease: Secondary | ICD-10-CM | POA: Diagnosis not present

## 2016-10-05 DIAGNOSIS — N189 Chronic kidney disease, unspecified: Secondary | ICD-10-CM | POA: Diagnosis not present

## 2016-10-05 DIAGNOSIS — Z89421 Acquired absence of other right toe(s): Secondary | ICD-10-CM | POA: Diagnosis not present

## 2016-10-05 DIAGNOSIS — R601 Generalized edema: Secondary | ICD-10-CM | POA: Diagnosis not present

## 2016-10-05 DIAGNOSIS — Z91048 Other nonmedicinal substance allergy status: Secondary | ICD-10-CM | POA: Diagnosis not present

## 2016-10-05 DIAGNOSIS — G4733 Obstructive sleep apnea (adult) (pediatric): Secondary | ICD-10-CM | POA: Diagnosis not present

## 2016-10-05 DIAGNOSIS — Z79899 Other long term (current) drug therapy: Secondary | ICD-10-CM | POA: Diagnosis not present

## 2016-10-05 DIAGNOSIS — E1152 Type 2 diabetes mellitus with diabetic peripheral angiopathy with gangrene: Secondary | ICD-10-CM | POA: Diagnosis not present

## 2016-10-05 DIAGNOSIS — N179 Acute kidney failure, unspecified: Secondary | ICD-10-CM | POA: Diagnosis not present

## 2016-10-05 DIAGNOSIS — L089 Local infection of the skin and subcutaneous tissue, unspecified: Secondary | ICD-10-CM | POA: Diagnosis not present

## 2016-10-05 DIAGNOSIS — Z885 Allergy status to narcotic agent status: Secondary | ICD-10-CM | POA: Diagnosis not present

## 2016-10-05 DIAGNOSIS — Z5329 Procedure and treatment not carried out because of patient's decision for other reasons: Secondary | ICD-10-CM | POA: Diagnosis not present

## 2016-10-05 DIAGNOSIS — S91001A Unspecified open wound, right ankle, initial encounter: Secondary | ICD-10-CM | POA: Diagnosis not present

## 2016-10-05 DIAGNOSIS — E1122 Type 2 diabetes mellitus with diabetic chronic kidney disease: Secondary | ICD-10-CM | POA: Diagnosis not present

## 2016-10-05 DIAGNOSIS — S91301A Unspecified open wound, right foot, initial encounter: Secondary | ICD-10-CM | POA: Diagnosis not present

## 2016-10-05 DIAGNOSIS — D631 Anemia in chronic kidney disease: Secondary | ICD-10-CM | POA: Diagnosis not present

## 2016-10-05 DIAGNOSIS — M879 Osteonecrosis, unspecified: Secondary | ICD-10-CM | POA: Diagnosis not present

## 2016-10-05 DIAGNOSIS — A419 Sepsis, unspecified organism: Secondary | ICD-10-CM | POA: Diagnosis not present

## 2016-10-05 DIAGNOSIS — E11628 Type 2 diabetes mellitus with other skin complications: Secondary | ICD-10-CM | POA: Diagnosis not present

## 2016-10-05 DIAGNOSIS — M86171 Other acute osteomyelitis, right ankle and foot: Secondary | ICD-10-CM | POA: Diagnosis not present

## 2016-10-05 DIAGNOSIS — T814XXA Infection following a procedure, initial encounter: Secondary | ICD-10-CM | POA: Diagnosis not present

## 2016-10-05 DIAGNOSIS — M65071 Abscess of tendon sheath, right ankle and foot: Secondary | ICD-10-CM | POA: Diagnosis not present

## 2016-10-05 DIAGNOSIS — Z87891 Personal history of nicotine dependence: Secondary | ICD-10-CM | POA: Diagnosis not present

## 2016-10-05 DIAGNOSIS — I10 Essential (primary) hypertension: Secondary | ICD-10-CM | POA: Diagnosis not present

## 2016-10-05 DIAGNOSIS — L02611 Cutaneous abscess of right foot: Secondary | ICD-10-CM | POA: Diagnosis not present

## 2016-10-05 DIAGNOSIS — E1165 Type 2 diabetes mellitus with hyperglycemia: Secondary | ICD-10-CM | POA: Diagnosis not present

## 2016-10-05 DIAGNOSIS — E1142 Type 2 diabetes mellitus with diabetic polyneuropathy: Secondary | ICD-10-CM | POA: Diagnosis not present

## 2016-10-05 DIAGNOSIS — A401 Sepsis due to streptococcus, group B: Secondary | ICD-10-CM | POA: Diagnosis not present

## 2016-10-05 DIAGNOSIS — Z881 Allergy status to other antibiotic agents status: Secondary | ICD-10-CM | POA: Diagnosis not present

## 2016-10-05 DIAGNOSIS — S91109A Unspecified open wound of unspecified toe(s) without damage to nail, initial encounter: Secondary | ICD-10-CM | POA: Diagnosis not present

## 2016-10-05 DIAGNOSIS — L02415 Cutaneous abscess of right lower limb: Secondary | ICD-10-CM | POA: Diagnosis not present

## 2016-10-05 DIAGNOSIS — N183 Chronic kidney disease, stage 3 (moderate): Secondary | ICD-10-CM | POA: Diagnosis not present

## 2016-10-05 DIAGNOSIS — A480 Gas gangrene: Secondary | ICD-10-CM | POA: Diagnosis not present

## 2016-10-05 DIAGNOSIS — Z794 Long term (current) use of insulin: Secondary | ICD-10-CM | POA: Diagnosis not present

## 2016-10-16 DIAGNOSIS — H548 Legal blindness, as defined in USA: Secondary | ICD-10-CM | POA: Diagnosis not present

## 2016-10-16 DIAGNOSIS — Z4801 Encounter for change or removal of surgical wound dressing: Secondary | ICD-10-CM | POA: Diagnosis not present

## 2016-10-16 DIAGNOSIS — N183 Chronic kidney disease, stage 3 (moderate): Secondary | ICD-10-CM | POA: Diagnosis not present

## 2016-10-16 DIAGNOSIS — J45909 Unspecified asthma, uncomplicated: Secondary | ICD-10-CM | POA: Diagnosis not present

## 2016-10-16 DIAGNOSIS — D631 Anemia in chronic kidney disease: Secondary | ICD-10-CM | POA: Diagnosis not present

## 2016-10-16 DIAGNOSIS — Z794 Long term (current) use of insulin: Secondary | ICD-10-CM | POA: Diagnosis not present

## 2016-10-16 DIAGNOSIS — E1122 Type 2 diabetes mellitus with diabetic chronic kidney disease: Secondary | ICD-10-CM | POA: Diagnosis not present

## 2016-10-16 DIAGNOSIS — Z89421 Acquired absence of other right toe(s): Secondary | ICD-10-CM | POA: Diagnosis not present

## 2016-10-16 DIAGNOSIS — Z4781 Encounter for orthopedic aftercare following surgical amputation: Secondary | ICD-10-CM | POA: Diagnosis not present

## 2016-10-16 DIAGNOSIS — E1140 Type 2 diabetes mellitus with diabetic neuropathy, unspecified: Secondary | ICD-10-CM | POA: Diagnosis not present

## 2016-10-16 DIAGNOSIS — I129 Hypertensive chronic kidney disease with stage 1 through stage 4 chronic kidney disease, or unspecified chronic kidney disease: Secondary | ICD-10-CM | POA: Diagnosis not present

## 2016-10-16 DIAGNOSIS — E1121 Type 2 diabetes mellitus with diabetic nephropathy: Secondary | ICD-10-CM | POA: Diagnosis not present

## 2016-10-16 DIAGNOSIS — G4733 Obstructive sleep apnea (adult) (pediatric): Secondary | ICD-10-CM | POA: Diagnosis not present

## 2016-10-16 DIAGNOSIS — E1151 Type 2 diabetes mellitus with diabetic peripheral angiopathy without gangrene: Secondary | ICD-10-CM | POA: Diagnosis not present

## 2016-10-18 DIAGNOSIS — R269 Unspecified abnormalities of gait and mobility: Secondary | ICD-10-CM | POA: Diagnosis not present

## 2016-10-18 DIAGNOSIS — G4733 Obstructive sleep apnea (adult) (pediatric): Secondary | ICD-10-CM | POA: Diagnosis not present

## 2016-10-18 DIAGNOSIS — E1165 Type 2 diabetes mellitus with hyperglycemia: Secondary | ICD-10-CM | POA: Diagnosis not present

## 2016-10-18 DIAGNOSIS — Z89421 Acquired absence of other right toe(s): Secondary | ICD-10-CM | POA: Diagnosis not present

## 2016-10-19 DIAGNOSIS — G4733 Obstructive sleep apnea (adult) (pediatric): Secondary | ICD-10-CM | POA: Diagnosis not present

## 2016-10-19 DIAGNOSIS — Z794 Long term (current) use of insulin: Secondary | ICD-10-CM | POA: Diagnosis not present

## 2016-10-19 DIAGNOSIS — Z4781 Encounter for orthopedic aftercare following surgical amputation: Secondary | ICD-10-CM | POA: Diagnosis not present

## 2016-10-19 DIAGNOSIS — H548 Legal blindness, as defined in USA: Secondary | ICD-10-CM | POA: Diagnosis not present

## 2016-10-19 DIAGNOSIS — E1140 Type 2 diabetes mellitus with diabetic neuropathy, unspecified: Secondary | ICD-10-CM | POA: Diagnosis not present

## 2016-10-19 DIAGNOSIS — J45909 Unspecified asthma, uncomplicated: Secondary | ICD-10-CM | POA: Diagnosis not present

## 2016-10-19 DIAGNOSIS — Z89421 Acquired absence of other right toe(s): Secondary | ICD-10-CM | POA: Diagnosis not present

## 2016-10-19 DIAGNOSIS — E1122 Type 2 diabetes mellitus with diabetic chronic kidney disease: Secondary | ICD-10-CM | POA: Diagnosis not present

## 2016-10-19 DIAGNOSIS — N183 Chronic kidney disease, stage 3 (moderate): Secondary | ICD-10-CM | POA: Diagnosis not present

## 2016-10-19 DIAGNOSIS — E1121 Type 2 diabetes mellitus with diabetic nephropathy: Secondary | ICD-10-CM | POA: Diagnosis not present

## 2016-10-19 DIAGNOSIS — Z4801 Encounter for change or removal of surgical wound dressing: Secondary | ICD-10-CM | POA: Diagnosis not present

## 2016-10-19 DIAGNOSIS — E1151 Type 2 diabetes mellitus with diabetic peripheral angiopathy without gangrene: Secondary | ICD-10-CM | POA: Diagnosis not present

## 2016-10-19 DIAGNOSIS — D631 Anemia in chronic kidney disease: Secondary | ICD-10-CM | POA: Diagnosis not present

## 2016-10-19 DIAGNOSIS — I129 Hypertensive chronic kidney disease with stage 1 through stage 4 chronic kidney disease, or unspecified chronic kidney disease: Secondary | ICD-10-CM | POA: Diagnosis not present

## 2016-10-21 ENCOUNTER — Encounter: Payer: Self-pay | Admitting: Family Medicine

## 2016-10-21 ENCOUNTER — Ambulatory Visit (INDEPENDENT_AMBULATORY_CARE_PROVIDER_SITE_OTHER): Payer: Medicare Other | Admitting: Family Medicine

## 2016-10-21 VITALS — BP 146/85 | HR 57 | Temp 98.5°F

## 2016-10-21 DIAGNOSIS — F411 Generalized anxiety disorder: Secondary | ICD-10-CM

## 2016-10-21 DIAGNOSIS — N179 Acute kidney failure, unspecified: Secondary | ICD-10-CM | POA: Diagnosis not present

## 2016-10-21 DIAGNOSIS — I10 Essential (primary) hypertension: Secondary | ICD-10-CM | POA: Diagnosis not present

## 2016-10-21 DIAGNOSIS — E1142 Type 2 diabetes mellitus with diabetic polyneuropathy: Secondary | ICD-10-CM

## 2016-10-21 DIAGNOSIS — L03119 Cellulitis of unspecified part of limb: Secondary | ICD-10-CM | POA: Diagnosis not present

## 2016-10-21 DIAGNOSIS — L02619 Cutaneous abscess of unspecified foot: Secondary | ICD-10-CM

## 2016-10-21 MED ORDER — FLUOXETINE HCL 40 MG PO CAPS: 40.0000 mg | ORAL_CAPSULE | Freq: Every day | ORAL | 3 refills | Status: AC

## 2016-10-21 MED ORDER — AMOXICILLIN-POT CLAVULANATE 875-125 MG PO TABS: 1.0000 | ORAL_TABLET | Freq: Two times a day (BID) | ORAL | 0 refills | Status: DC

## 2016-10-21 NOTE — Progress Notes (Signed)
   Subjective:    Patient ID: Alex Ryan, male    DOB: 15-May-1971, 46 y.o.   MRN: [id number]  HPI Here to follow up an abscess and osteomyelitis in the right foot. When we saw him her on 09-22-16 the right 2nd toe was clearly gangrenous and we referred him to his podiatrist, Kieth Brightly DPM in [place]. He was admitted that day to the [facility] facility and underwent a ray amp of the right 2nd toe and removal of part of the 3rd toe. The entire foot was opened and cleaned out. Cultures grew Strep agalactiae. He was there about 8 days and has been at home since. I did review all these records and test results.  He gets home health nursing and PT visits. He is on oral Augmentin. His glucoses are quite out of control of course during all this, but they ar enow back to the 100s. He feels well in general.    Review of Systems  Constitutional: Negative.   Respiratory: Negative.   Cardiovascular: Negative.   Gastrointestinal: Negative.   Skin: Positive for wound.  Psychiatric/Behavioral: Negative.        Objective:   Physical Exam  Constitutional: He is oriented to person, place, and time. He appears well-developed and well-nourished.  In a wheelchair   Cardiovascular: Normal rate, regular rhythm, normal heart sounds and intact distal pulses.   Pulmonary/Chest: Effort normal and breath sounds normal. No respiratory distress. He has no wheezes. He has no rales.  Neurological: He is alert and oriented to person, place, and time.  Psychiatric: He has a normal mood and affect. His behavior is normal. Thought content normal.          Assessment & Plan:  He is recovering well from an abscessed foot. He will see Dr. Neomia Dear again on 10-29-16. He is on Augmentin for now and will likely be on this for another 60 days or so. His diabetes is now stable. He had acute renal failure in the hospital but his creatinine came back down to baseline by the time of DC. He will see his nephrologist, Dr.  Gerarda Gunther, on 11-11-16. His depression and anxiety are stable, and we refilled his Prozac.  Alysia Penna, MD

## 2016-10-21 NOTE — Patient Instructions (Signed)
WE NOW OFFER   [facility] FAST TRACK!!!  SAME DAY Appointments for ACUTE CARE  Such as: Sprains, Injuries, cuts, abrasions, rashes, muscle pain, joint pain, back pain Colds, flu, sore throats, headache, allergies, cough, fever  Ear pain, sinus and eye infections Abdominal pain, nausea, vomiting, diarrhea, upset stomach Animal/insect bites  3 Easy Ways to Schedule: Walk-In Scheduling Call in scheduling Mychart Sign-up: [email or web address]         

## 2016-10-22 DIAGNOSIS — Z794 Long term (current) use of insulin: Secondary | ICD-10-CM | POA: Diagnosis not present

## 2016-10-22 DIAGNOSIS — G4733 Obstructive sleep apnea (adult) (pediatric): Secondary | ICD-10-CM | POA: Diagnosis not present

## 2016-10-22 DIAGNOSIS — J45909 Unspecified asthma, uncomplicated: Secondary | ICD-10-CM | POA: Diagnosis not present

## 2016-10-22 DIAGNOSIS — Z89421 Acquired absence of other right toe(s): Secondary | ICD-10-CM | POA: Diagnosis not present

## 2016-10-22 DIAGNOSIS — E1122 Type 2 diabetes mellitus with diabetic chronic kidney disease: Secondary | ICD-10-CM | POA: Diagnosis not present

## 2016-10-22 DIAGNOSIS — D631 Anemia in chronic kidney disease: Secondary | ICD-10-CM | POA: Diagnosis not present

## 2016-10-22 DIAGNOSIS — H548 Legal blindness, as defined in USA: Secondary | ICD-10-CM | POA: Diagnosis not present

## 2016-10-22 DIAGNOSIS — E1140 Type 2 diabetes mellitus with diabetic neuropathy, unspecified: Secondary | ICD-10-CM | POA: Diagnosis not present

## 2016-10-22 DIAGNOSIS — E1121 Type 2 diabetes mellitus with diabetic nephropathy: Secondary | ICD-10-CM | POA: Diagnosis not present

## 2016-10-22 DIAGNOSIS — Z4801 Encounter for change or removal of surgical wound dressing: Secondary | ICD-10-CM | POA: Diagnosis not present

## 2016-10-22 DIAGNOSIS — E1151 Type 2 diabetes mellitus with diabetic peripheral angiopathy without gangrene: Secondary | ICD-10-CM | POA: Diagnosis not present

## 2016-10-22 DIAGNOSIS — I129 Hypertensive chronic kidney disease with stage 1 through stage 4 chronic kidney disease, or unspecified chronic kidney disease: Secondary | ICD-10-CM | POA: Diagnosis not present

## 2016-10-22 DIAGNOSIS — N183 Chronic kidney disease, stage 3 (moderate): Secondary | ICD-10-CM | POA: Diagnosis not present

## 2016-10-22 DIAGNOSIS — Z4781 Encounter for orthopedic aftercare following surgical amputation: Secondary | ICD-10-CM | POA: Diagnosis not present

## 2016-10-23 ENCOUNTER — Encounter: Payer: Self-pay | Admitting: *Deleted

## 2016-10-23 DIAGNOSIS — Z4801 Encounter for change or removal of surgical wound dressing: Secondary | ICD-10-CM | POA: Diagnosis not present

## 2016-10-23 DIAGNOSIS — N183 Chronic kidney disease, stage 3 (moderate): Secondary | ICD-10-CM | POA: Diagnosis not present

## 2016-10-23 DIAGNOSIS — Z89421 Acquired absence of other right toe(s): Secondary | ICD-10-CM | POA: Diagnosis not present

## 2016-10-23 DIAGNOSIS — H548 Legal blindness, as defined in USA: Secondary | ICD-10-CM | POA: Diagnosis not present

## 2016-10-23 DIAGNOSIS — E1140 Type 2 diabetes mellitus with diabetic neuropathy, unspecified: Secondary | ICD-10-CM | POA: Diagnosis not present

## 2016-10-23 DIAGNOSIS — Z4781 Encounter for orthopedic aftercare following surgical amputation: Secondary | ICD-10-CM | POA: Diagnosis not present

## 2016-10-23 DIAGNOSIS — E1122 Type 2 diabetes mellitus with diabetic chronic kidney disease: Secondary | ICD-10-CM | POA: Diagnosis not present

## 2016-10-23 DIAGNOSIS — G4733 Obstructive sleep apnea (adult) (pediatric): Secondary | ICD-10-CM | POA: Diagnosis not present

## 2016-10-23 DIAGNOSIS — I129 Hypertensive chronic kidney disease with stage 1 through stage 4 chronic kidney disease, or unspecified chronic kidney disease: Secondary | ICD-10-CM | POA: Diagnosis not present

## 2016-10-23 DIAGNOSIS — D631 Anemia in chronic kidney disease: Secondary | ICD-10-CM | POA: Diagnosis not present

## 2016-10-23 DIAGNOSIS — E1151 Type 2 diabetes mellitus with diabetic peripheral angiopathy without gangrene: Secondary | ICD-10-CM | POA: Diagnosis not present

## 2016-10-23 DIAGNOSIS — E1121 Type 2 diabetes mellitus with diabetic nephropathy: Secondary | ICD-10-CM | POA: Diagnosis not present

## 2016-10-23 DIAGNOSIS — J45909 Unspecified asthma, uncomplicated: Secondary | ICD-10-CM | POA: Diagnosis not present

## 2016-10-23 DIAGNOSIS — Z794 Long term (current) use of insulin: Secondary | ICD-10-CM | POA: Diagnosis not present

## 2016-10-25 DIAGNOSIS — G4733 Obstructive sleep apnea (adult) (pediatric): Secondary | ICD-10-CM | POA: Diagnosis not present

## 2016-10-25 DIAGNOSIS — H548 Legal blindness, as defined in USA: Secondary | ICD-10-CM | POA: Diagnosis not present

## 2016-10-25 DIAGNOSIS — E1122 Type 2 diabetes mellitus with diabetic chronic kidney disease: Secondary | ICD-10-CM | POA: Diagnosis not present

## 2016-10-25 DIAGNOSIS — E1151 Type 2 diabetes mellitus with diabetic peripheral angiopathy without gangrene: Secondary | ICD-10-CM | POA: Diagnosis not present

## 2016-10-25 DIAGNOSIS — Z4781 Encounter for orthopedic aftercare following surgical amputation: Secondary | ICD-10-CM | POA: Diagnosis not present

## 2016-10-25 DIAGNOSIS — Z794 Long term (current) use of insulin: Secondary | ICD-10-CM | POA: Diagnosis not present

## 2016-10-25 DIAGNOSIS — J45909 Unspecified asthma, uncomplicated: Secondary | ICD-10-CM | POA: Diagnosis not present

## 2016-10-25 DIAGNOSIS — Z89421 Acquired absence of other right toe(s): Secondary | ICD-10-CM | POA: Diagnosis not present

## 2016-10-25 DIAGNOSIS — N183 Chronic kidney disease, stage 3 (moderate): Secondary | ICD-10-CM | POA: Diagnosis not present

## 2016-10-25 DIAGNOSIS — I129 Hypertensive chronic kidney disease with stage 1 through stage 4 chronic kidney disease, or unspecified chronic kidney disease: Secondary | ICD-10-CM | POA: Diagnosis not present

## 2016-10-25 DIAGNOSIS — E1140 Type 2 diabetes mellitus with diabetic neuropathy, unspecified: Secondary | ICD-10-CM | POA: Diagnosis not present

## 2016-10-25 DIAGNOSIS — D631 Anemia in chronic kidney disease: Secondary | ICD-10-CM | POA: Diagnosis not present

## 2016-10-25 DIAGNOSIS — Z4801 Encounter for change or removal of surgical wound dressing: Secondary | ICD-10-CM | POA: Diagnosis not present

## 2016-10-25 DIAGNOSIS — E1121 Type 2 diabetes mellitus with diabetic nephropathy: Secondary | ICD-10-CM | POA: Diagnosis not present

## 2016-10-26 ENCOUNTER — Other Ambulatory Visit: Payer: Self-pay | Admitting: *Deleted

## 2016-10-26 DIAGNOSIS — J45909 Unspecified asthma, uncomplicated: Secondary | ICD-10-CM | POA: Diagnosis not present

## 2016-10-26 DIAGNOSIS — Z4781 Encounter for orthopedic aftercare following surgical amputation: Secondary | ICD-10-CM | POA: Diagnosis not present

## 2016-10-26 DIAGNOSIS — N183 Chronic kidney disease, stage 3 (moderate): Secondary | ICD-10-CM | POA: Diagnosis not present

## 2016-10-26 DIAGNOSIS — Z794 Long term (current) use of insulin: Secondary | ICD-10-CM | POA: Diagnosis not present

## 2016-10-26 DIAGNOSIS — E1121 Type 2 diabetes mellitus with diabetic nephropathy: Secondary | ICD-10-CM | POA: Diagnosis not present

## 2016-10-26 DIAGNOSIS — E1151 Type 2 diabetes mellitus with diabetic peripheral angiopathy without gangrene: Secondary | ICD-10-CM | POA: Diagnosis not present

## 2016-10-26 DIAGNOSIS — Z89421 Acquired absence of other right toe(s): Secondary | ICD-10-CM | POA: Diagnosis not present

## 2016-10-26 DIAGNOSIS — D631 Anemia in chronic kidney disease: Secondary | ICD-10-CM | POA: Diagnosis not present

## 2016-10-26 DIAGNOSIS — E1122 Type 2 diabetes mellitus with diabetic chronic kidney disease: Secondary | ICD-10-CM | POA: Diagnosis not present

## 2016-10-26 DIAGNOSIS — I129 Hypertensive chronic kidney disease with stage 1 through stage 4 chronic kidney disease, or unspecified chronic kidney disease: Secondary | ICD-10-CM | POA: Diagnosis not present

## 2016-10-26 DIAGNOSIS — H548 Legal blindness, as defined in USA: Secondary | ICD-10-CM | POA: Diagnosis not present

## 2016-10-26 DIAGNOSIS — E1140 Type 2 diabetes mellitus with diabetic neuropathy, unspecified: Secondary | ICD-10-CM | POA: Diagnosis not present

## 2016-10-26 DIAGNOSIS — G4733 Obstructive sleep apnea (adult) (pediatric): Secondary | ICD-10-CM | POA: Diagnosis not present

## 2016-10-26 DIAGNOSIS — Z4801 Encounter for change or removal of surgical wound dressing: Secondary | ICD-10-CM | POA: Diagnosis not present

## 2016-10-26 NOTE — Addendum Note (Signed)
Addended by: Gabriel Cirri on: 10/26/2016 01:48 PM   Modules accepted: Orders

## 2016-10-26 NOTE — Patient Outreach (Addendum)
[facility]) Care Management  10/23/16  Alex Ryan [birth year] [id number]  Transition of Care Referral  Referral Date: 10/23/16 Referral Source: [facility] High Risk Date of Discharge: 10/15/16 Facility: [facility] Discharge Diagnosis: Abscess of Rt foot Insurance: [facility]  Outreach attempt # 1 spoke with patient about recent hospitalization HIPPA verified with patient.  Social: Patient lives with his wife. He requires assistance with his ADLs. His mother transports patient to all medical appointment. Patient is active with [facility] (RN/PT). Patient uses a wheelchair and RW.  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 Male, who verbalized having 5 surgeries to his right foot. His right 2nd toe was amputated and partial removal of his 3rd toe during his last hospital admission. Per MD notes, patient DM management has been unsuccessful. Patient's last documented Hgb A1C was 11.7. Documentation showed blood glucose ranging in the 400s. Patient verbalized being interested in [facility] services.    Medications:  Patient reported taking 8 meds per day. Patient reported being able to afford her medications and taking them as prescribed. Patient had no questions or concerns about her meds.   Appointments: Patient has an appointment scheduled on November 11, 2016 with Dr. Aundra Dubin. His last PCP appointment was on 10/21/16. He has a scheduled appointment with his Podiatrist on October 28, 2016.  Consent: [facility] services reviewed and discussed with patient. Verbal consent given for services.   Plan: RN CM will send referral to [facility] RN for further in home eval/assessment of care needs and management of chronic conditions. RN CM advised patient to contact RNCM for any needs or concerns.  Lake Bells, RN, BSN, MHA/MSL, [place] Telephonic Care Manager Coordinator [facility] Direct Phone: [phone] Toll Free: [phone] Fax: [phone]

## 2016-10-27 ENCOUNTER — Other Ambulatory Visit: Payer: Self-pay | Admitting: *Deleted

## 2016-10-27 DIAGNOSIS — I129 Hypertensive chronic kidney disease with stage 1 through stage 4 chronic kidney disease, or unspecified chronic kidney disease: Secondary | ICD-10-CM | POA: Diagnosis not present

## 2016-10-27 DIAGNOSIS — E1122 Type 2 diabetes mellitus with diabetic chronic kidney disease: Secondary | ICD-10-CM | POA: Diagnosis not present

## 2016-10-27 DIAGNOSIS — E1151 Type 2 diabetes mellitus with diabetic peripheral angiopathy without gangrene: Secondary | ICD-10-CM | POA: Diagnosis not present

## 2016-10-27 DIAGNOSIS — E1140 Type 2 diabetes mellitus with diabetic neuropathy, unspecified: Secondary | ICD-10-CM | POA: Diagnosis not present

## 2016-10-27 DIAGNOSIS — D631 Anemia in chronic kidney disease: Secondary | ICD-10-CM | POA: Diagnosis not present

## 2016-10-27 DIAGNOSIS — G4733 Obstructive sleep apnea (adult) (pediatric): Secondary | ICD-10-CM | POA: Diagnosis not present

## 2016-10-27 DIAGNOSIS — N183 Chronic kidney disease, stage 3 (moderate): Secondary | ICD-10-CM | POA: Diagnosis not present

## 2016-10-27 DIAGNOSIS — Z4801 Encounter for change or removal of surgical wound dressing: Secondary | ICD-10-CM | POA: Diagnosis not present

## 2016-10-27 DIAGNOSIS — H548 Legal blindness, as defined in USA: Secondary | ICD-10-CM | POA: Diagnosis not present

## 2016-10-27 DIAGNOSIS — J45909 Unspecified asthma, uncomplicated: Secondary | ICD-10-CM | POA: Diagnosis not present

## 2016-10-27 DIAGNOSIS — E1121 Type 2 diabetes mellitus with diabetic nephropathy: Secondary | ICD-10-CM | POA: Diagnosis not present

## 2016-10-27 DIAGNOSIS — Z89421 Acquired absence of other right toe(s): Secondary | ICD-10-CM | POA: Diagnosis not present

## 2016-10-27 DIAGNOSIS — Z4781 Encounter for orthopedic aftercare following surgical amputation: Secondary | ICD-10-CM | POA: Diagnosis not present

## 2016-10-27 DIAGNOSIS — Z794 Long term (current) use of insulin: Secondary | ICD-10-CM | POA: Diagnosis not present

## 2016-10-27 NOTE — Patient Outreach (Signed)
[facility]) Care Management  10/27/2016  Alex Ryan 1970/06/26 [id number]   Referral received on 6/11 via high risk [employer].   RN spoke with pt today and introduced the [facility] program and services. RN inquired if this was a good time to talk as pt receptive at this time. RN discussed some of the referral information and introduced the [facility] services and program available to assist.   Pt explained his medical issues and possible needs related and receptive to [facility] services. Discussed the involvement of [facility] services with [facility] (RN/PT) and how this would not be a conflict for services with [facility]. Pt receptive and wished to proceed with community home visit when presented.   Discussed possible needs related to pt's medical issues and concluded pt is in need of education and management related to his diabetes with the last A1C at 11.7. Pt has indicated his CBG has improved from the noted 400 range to a range of 110-150 with improved eating habits. Pt has indicated he is now with a endocrinologist to assist with his diabetes and is more confident that he can control his readings.   Due to the time RN offered to follow up on another day this week to obtain additional information and offered to scheduled the initial home visit. Pt receptive to both contacts.  Raina Mina, RN Care Management Coordinator [place] Office [phone]

## 2016-10-28 ENCOUNTER — Other Ambulatory Visit: Payer: Self-pay | Admitting: *Deleted

## 2016-10-28 DIAGNOSIS — E1122 Type 2 diabetes mellitus with diabetic chronic kidney disease: Secondary | ICD-10-CM | POA: Diagnosis not present

## 2016-10-28 DIAGNOSIS — I129 Hypertensive chronic kidney disease with stage 1 through stage 4 chronic kidney disease, or unspecified chronic kidney disease: Secondary | ICD-10-CM | POA: Diagnosis not present

## 2016-10-28 DIAGNOSIS — G4733 Obstructive sleep apnea (adult) (pediatric): Secondary | ICD-10-CM | POA: Diagnosis not present

## 2016-10-28 DIAGNOSIS — J45909 Unspecified asthma, uncomplicated: Secondary | ICD-10-CM | POA: Diagnosis not present

## 2016-10-28 DIAGNOSIS — E1140 Type 2 diabetes mellitus with diabetic neuropathy, unspecified: Secondary | ICD-10-CM | POA: Diagnosis not present

## 2016-10-28 DIAGNOSIS — E1121 Type 2 diabetes mellitus with diabetic nephropathy: Secondary | ICD-10-CM | POA: Diagnosis not present

## 2016-10-28 DIAGNOSIS — H548 Legal blindness, as defined in USA: Secondary | ICD-10-CM | POA: Diagnosis not present

## 2016-10-28 DIAGNOSIS — Z4781 Encounter for orthopedic aftercare following surgical amputation: Secondary | ICD-10-CM | POA: Diagnosis not present

## 2016-10-28 DIAGNOSIS — Z794 Long term (current) use of insulin: Secondary | ICD-10-CM | POA: Diagnosis not present

## 2016-10-28 DIAGNOSIS — N183 Chronic kidney disease, stage 3 (moderate): Secondary | ICD-10-CM | POA: Diagnosis not present

## 2016-10-28 DIAGNOSIS — Z4801 Encounter for change or removal of surgical wound dressing: Secondary | ICD-10-CM | POA: Diagnosis not present

## 2016-10-28 DIAGNOSIS — D631 Anemia in chronic kidney disease: Secondary | ICD-10-CM | POA: Diagnosis not present

## 2016-10-28 DIAGNOSIS — E1151 Type 2 diabetes mellitus with diabetic peripheral angiopathy without gangrene: Secondary | ICD-10-CM | POA: Diagnosis not present

## 2016-10-28 DIAGNOSIS — Z89421 Acquired absence of other right toe(s): Secondary | ICD-10-CM | POA: Diagnosis not present

## 2016-10-29 NOTE — Patient Outreach (Signed)
[facility]) Care Management  10/28/2016  [name] [birth year] [id number]   RN attempted outreach call today for additional information however pt busy with an outing and was not able to talk. RN offered to gather the additional information upon the initial home visit which has been scheduled for next week. No additional inquires or request at this time as pt remains receptive to the current plan of care.   Raina Mina, RN Care Management Coordinator [place] Office [phone]

## 2016-11-02 DIAGNOSIS — D649 Anemia, unspecified: Secondary | ICD-10-CM | POA: Diagnosis not present

## 2016-11-02 DIAGNOSIS — Z89511 Acquired absence of right leg below knee: Secondary | ICD-10-CM | POA: Diagnosis not present

## 2016-11-02 DIAGNOSIS — N183 Chronic kidney disease, stage 3 (moderate): Secondary | ICD-10-CM | POA: Diagnosis not present

## 2016-11-02 DIAGNOSIS — L089 Local infection of the skin and subcutaneous tissue, unspecified: Secondary | ICD-10-CM | POA: Diagnosis not present

## 2016-11-02 DIAGNOSIS — Z794 Long term (current) use of insulin: Secondary | ICD-10-CM | POA: Diagnosis not present

## 2016-11-02 DIAGNOSIS — E11621 Type 2 diabetes mellitus with foot ulcer: Secondary | ICD-10-CM | POA: Diagnosis not present

## 2016-11-02 DIAGNOSIS — E11319 Type 2 diabetes mellitus with unspecified diabetic retinopathy without macular edema: Secondary | ICD-10-CM | POA: Diagnosis not present

## 2016-11-02 DIAGNOSIS — M86171 Other acute osteomyelitis, right ankle and foot: Secondary | ICD-10-CM | POA: Diagnosis not present

## 2016-11-02 DIAGNOSIS — E1142 Type 2 diabetes mellitus with diabetic polyneuropathy: Secondary | ICD-10-CM | POA: Diagnosis not present

## 2016-11-02 DIAGNOSIS — I872 Venous insufficiency (chronic) (peripheral): Secondary | ICD-10-CM | POA: Diagnosis not present

## 2016-11-02 DIAGNOSIS — Z91048 Other nonmedicinal substance allergy status: Secondary | ICD-10-CM | POA: Diagnosis not present

## 2016-11-02 DIAGNOSIS — J45909 Unspecified asthma, uncomplicated: Secondary | ICD-10-CM | POA: Diagnosis not present

## 2016-11-02 DIAGNOSIS — I129 Hypertensive chronic kidney disease with stage 1 through stage 4 chronic kidney disease, or unspecified chronic kidney disease: Secondary | ICD-10-CM | POA: Diagnosis not present

## 2016-11-02 DIAGNOSIS — E11628 Type 2 diabetes mellitus with other skin complications: Secondary | ICD-10-CM | POA: Diagnosis not present

## 2016-11-02 DIAGNOSIS — Z79899 Other long term (current) drug therapy: Secondary | ICD-10-CM | POA: Diagnosis not present

## 2016-11-02 DIAGNOSIS — I10 Essential (primary) hypertension: Secondary | ICD-10-CM | POA: Diagnosis not present

## 2016-11-02 DIAGNOSIS — E1121 Type 2 diabetes mellitus with diabetic nephropathy: Secondary | ICD-10-CM | POA: Diagnosis not present

## 2016-11-02 DIAGNOSIS — Z885 Allergy status to narcotic agent status: Secondary | ICD-10-CM | POA: Diagnosis not present

## 2016-11-02 DIAGNOSIS — E1165 Type 2 diabetes mellitus with hyperglycemia: Secondary | ICD-10-CM | POA: Diagnosis not present

## 2016-11-02 DIAGNOSIS — E1122 Type 2 diabetes mellitus with diabetic chronic kidney disease: Secondary | ICD-10-CM | POA: Diagnosis not present

## 2016-11-02 DIAGNOSIS — Z87891 Personal history of nicotine dependence: Secondary | ICD-10-CM | POA: Diagnosis not present

## 2016-11-02 DIAGNOSIS — H548 Legal blindness, as defined in USA: Secondary | ICD-10-CM | POA: Diagnosis not present

## 2016-11-02 DIAGNOSIS — Z881 Allergy status to other antibiotic agents status: Secondary | ICD-10-CM | POA: Diagnosis not present

## 2016-11-02 DIAGNOSIS — G4733 Obstructive sleep apnea (adult) (pediatric): Secondary | ICD-10-CM | POA: Diagnosis not present

## 2016-11-03 ENCOUNTER — Ambulatory Visit: Payer: Self-pay | Admitting: *Deleted

## 2016-11-04 ENCOUNTER — Other Ambulatory Visit: Payer: Self-pay | Admitting: *Deleted

## 2016-11-04 NOTE — Patient Outreach (Addendum)
[place] [facility]) Care Management  11/03/2016  Alex Ryan April 26, 1971 [id number]  Initial home visit cancelled due to hospitalization.  RN contact pt as a reminder for today's appointment as RN in route to the home visit for this morning. Pt states he was admitted to the hospital last night and will have to undergo surgery for an amputation to the right foot (infected). Pt current at [facility] for this procedure. RN will continue to follow up accordingly and proceed with [facility] services upon discharge. Pt aware if RN is needed prior to to reach out to the RN case management for assistance.   Raina Mina, RN Care Management Coordinator [place] Office [phone]

## 2016-11-09 ENCOUNTER — Other Ambulatory Visit: Payer: Self-pay

## 2016-11-09 MED ORDER — ONETOUCH LANCETS MISC: 5 refills | Status: AC

## 2016-11-09 MED ORDER — GLUCOSE BLOOD VI STRP: ORAL_STRIP | 5 refills | Status: AC

## 2016-11-09 MED ORDER — ONETOUCH VERIO IQ SYSTEM W/DEVICE KIT: PACK | 0 refills | Status: AC

## 2016-11-09 NOTE — Telephone Encounter (Signed)
Call was sent to me regarding the meter, strips and lancet. Patient states they have not received it to the pharmacy, I did not see any note in the system but I advised wife that I would submit what was covered today. No other issues.

## 2016-11-10 ENCOUNTER — Other Ambulatory Visit: Payer: Self-pay | Admitting: *Deleted

## 2016-11-10 NOTE — Patient Outreach (Signed)
[facility] [facility]) Care Management  11/10/2016  Alex Ryan November 30, 1970 [id number]   Transition of care  RN outreached to pt today and was able to complete a transition of care. RN has spoken with pt upon his admission to the hospital pending a home visit. Due to pt's admission with plans for amputation during that time case management services was delayed. Pt reports he was discharge from [place] on yesterday and will have [place] involved for therapy in the home. RN inquired on follow up appointments, medications and reviewed the current plan of care related to diabetes. Pt states he CBGs continue to range from 80-141 which was this morning's read. Pt states he is recovering well with no very few medications. No other issues or encountered problems to report at this time. RN strongly encouraged pt to adhere to the ongoing plan of care and prevention interventions related to his diabetes. Pt verbalized an understanding. RN offered to extend a home visit this week however pt states the [facility] agency will be visiting daily to start then began to wean. Inquired on possible follow up next week. Pt receptive to a transition of care call but does not wish to schedule a home visit over the next two weeks until he has established a schedule with the [facility] agency. RN requested ongoing transition of care calls over the next two weeks with plans to scheduled a possible home visit around the two week in July (pt receptive). Pt aware RN coverage partner Valente David, RN will follow up next week and this RN would follow up on July 10 pending a possible home visit later in that same week if possible with pt's schedule at that time. Again pt receptive with this plan.  Patient was recently discharged from hospital and all medications have been reviewed.  Alex Mina, RN Care Management Coordinator [place] Office [phone]

## 2016-11-11 DIAGNOSIS — I10 Essential (primary) hypertension: Secondary | ICD-10-CM | POA: Diagnosis not present

## 2016-11-11 DIAGNOSIS — N183 Chronic kidney disease, stage 3 (moderate): Secondary | ICD-10-CM | POA: Diagnosis not present

## 2016-11-11 DIAGNOSIS — D649 Anemia, unspecified: Secondary | ICD-10-CM | POA: Diagnosis not present

## 2016-11-17 ENCOUNTER — Other Ambulatory Visit: Payer: Self-pay | Admitting: *Deleted

## 2016-11-17 ENCOUNTER — Telehealth: Payer: Self-pay | Admitting: Family Medicine

## 2016-11-17 ENCOUNTER — Ambulatory Visit (INDEPENDENT_AMBULATORY_CARE_PROVIDER_SITE_OTHER): Payer: Medicare Other | Admitting: Family Medicine

## 2016-11-17 ENCOUNTER — Encounter: Payer: Self-pay | Admitting: Family Medicine

## 2016-11-17 VITALS — BP 142/90 | Temp 99.1°F

## 2016-11-17 DIAGNOSIS — L02619 Cutaneous abscess of unspecified foot: Secondary | ICD-10-CM | POA: Diagnosis not present

## 2016-11-17 DIAGNOSIS — R809 Proteinuria, unspecified: Secondary | ICD-10-CM

## 2016-11-17 DIAGNOSIS — I10 Essential (primary) hypertension: Secondary | ICD-10-CM

## 2016-11-17 DIAGNOSIS — E1029 Type 1 diabetes mellitus with other diabetic kidney complication: Secondary | ICD-10-CM | POA: Diagnosis not present

## 2016-11-17 DIAGNOSIS — L03119 Cellulitis of unspecified part of limb: Secondary | ICD-10-CM

## 2016-11-17 DIAGNOSIS — E1142 Type 2 diabetes mellitus with diabetic polyneuropathy: Secondary | ICD-10-CM | POA: Diagnosis not present

## 2016-11-17 DIAGNOSIS — R7989 Other specified abnormal findings of blood chemistry: Secondary | ICD-10-CM

## 2016-11-17 DIAGNOSIS — IMO0002 Reserved for concepts with insufficient information to code with codable children: Secondary | ICD-10-CM

## 2016-11-17 DIAGNOSIS — E1065 Type 1 diabetes mellitus with hyperglycemia: Secondary | ICD-10-CM

## 2016-11-17 DIAGNOSIS — N049 Nephrotic syndrome with unspecified morphologic changes: Secondary | ICD-10-CM | POA: Diagnosis not present

## 2016-11-17 DIAGNOSIS — E1165 Type 2 diabetes mellitus with hyperglycemia: Secondary | ICD-10-CM | POA: Diagnosis not present

## 2016-11-17 DIAGNOSIS — R946 Abnormal results of thyroid function studies: Secondary | ICD-10-CM | POA: Diagnosis not present

## 2016-11-17 LAB — T4, FREE: FREE T4: 0.92 ng/dL (ref 0.60–1.60)

## 2016-11-17 LAB — TSH: TSH: 3.1 u[IU]/mL (ref 0.35–4.50)

## 2016-11-17 LAB — T3, FREE: T3, Free: 3.2 pg/mL (ref 2.3–4.2)

## 2016-11-17 NOTE — Telephone Encounter (Signed)
Per Dr Sarajane Jews call [facility] home health and order nursing to change dressing on right leg 3 X a week, PT. I spoke with  Jinny Blossom from [place] at [phone] and gave verbal order, patient is already in system, they can resume visit.

## 2016-11-17 NOTE — Patient Instructions (Signed)
WE NOW OFFER    [facility] FAST TRACK!!!  SAME DAY Appointments for ACUTE CARE  Such as: Sprains, Injuries, cuts, abrasions, rashes, muscle pain, joint pain, back pain Colds, flu, sore throats, headache, allergies, cough, fever  Ear pain, sinus and eye infections Abdominal pain, nausea, vomiting, diarrhea, upset stomach Animal/insect bites  3 Easy Ways to Schedule: Walk-In Scheduling Call in scheduling Mychart Sign-up: [email or web address]         

## 2016-11-17 NOTE — Progress Notes (Signed)
   Subjective:    Patient ID: Alex Ryan, male    DOB: [birth year], 46 y.o.   MRN: [id number]  HPI Here to follow up a hospital stay at [facility] from 11-02-16 to 11-09-16 during which he had a right below the knee amputation. 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 The surgery went very well and he now feels great. His diabetes was poorly controlled, as usual, and his A1c was 10.9. He was quite anemic, from a combination of anemia of chronic disease and surgical losses. His Hgb was 7.5 at DC. His creatinine was stable at 1.10. The surgery was performed by Dr. Pauline Good of Vascular Surgery. He saw a Nephrologist by the name of Dr. Aundra Dubin on 11-11-16 and his renal status was stable. Alex Ryan has very little pain now and is off all antibiotics. The family has been changing the dressings on the stump daily. Apparently orders were given to [facility] for PT but this has not begun yet.    Review of Systems  Constitutional: Negative.   Respiratory: Negative.   Cardiovascular: Negative.   Gastrointestinal: Negative.   Neurological: Negative.        Objective:   Physical Exam  Constitutional: He is oriented to person, place, and time.  Alert, in a wheelchair   Neck: No thyromegaly present.  Cardiovascular: Normal rate, regular rhythm, normal heart sounds and intact distal pulses.   Pulmonary/Chest: Effort normal and breath sounds normal. No respiratory distress. He has no wheezes. He has no rales.  Musculoskeletal:  1+ edema in the left ankle   Lymphadenopathy:    He has no cervical adenopathy.  Neurological: He is alert and oriented to person, place, and time.          Assessment & Plan:  He is recovering well from a right BKA. I reviewed all notes and test results with him today. He will follow up with Dr. Aundra Dubin in 3 months for the renal disease. He will follow up with Vascular Disease as scheduled. We will check his thyroid  status since this has not been checked in well over a year. We will contact [facility] to set up PT and nursing visits.  Alysia Penna, MD

## 2016-11-17 NOTE — Patient Outreach (Signed)
[facility]) Care Management  11/17/2016  HEIDI LEMAY 20-Mar-1971 [id number]   Covering for assigned care manager, L. Zigmund Daniel.  Weekly transition of care placed to member, no answer.  HIPAA compliant voice message left, will await call back.  Will follow up within the next week.  Valente David, [place], MSN [place] [phone]

## 2016-11-19 DIAGNOSIS — I129 Hypertensive chronic kidney disease with stage 1 through stage 4 chronic kidney disease, or unspecified chronic kidney disease: Secondary | ICD-10-CM | POA: Diagnosis not present

## 2016-11-19 DIAGNOSIS — H548 Legal blindness, as defined in USA: Secondary | ICD-10-CM | POA: Diagnosis not present

## 2016-11-19 DIAGNOSIS — N183 Chronic kidney disease, stage 3 (moderate): Secondary | ICD-10-CM | POA: Diagnosis not present

## 2016-11-19 DIAGNOSIS — E1121 Type 2 diabetes mellitus with diabetic nephropathy: Secondary | ICD-10-CM | POA: Diagnosis not present

## 2016-11-19 DIAGNOSIS — E1122 Type 2 diabetes mellitus with diabetic chronic kidney disease: Secondary | ICD-10-CM | POA: Diagnosis not present

## 2016-11-19 DIAGNOSIS — G4733 Obstructive sleep apnea (adult) (pediatric): Secondary | ICD-10-CM | POA: Diagnosis not present

## 2016-11-19 DIAGNOSIS — Z794 Long term (current) use of insulin: Secondary | ICD-10-CM | POA: Diagnosis not present

## 2016-11-19 DIAGNOSIS — Z4801 Encounter for change or removal of surgical wound dressing: Secondary | ICD-10-CM | POA: Diagnosis not present

## 2016-11-19 DIAGNOSIS — D631 Anemia in chronic kidney disease: Secondary | ICD-10-CM | POA: Diagnosis not present

## 2016-11-19 DIAGNOSIS — E1151 Type 2 diabetes mellitus with diabetic peripheral angiopathy without gangrene: Secondary | ICD-10-CM | POA: Diagnosis not present

## 2016-11-19 DIAGNOSIS — Z4781 Encounter for orthopedic aftercare following surgical amputation: Secondary | ICD-10-CM | POA: Diagnosis not present

## 2016-11-19 DIAGNOSIS — E1140 Type 2 diabetes mellitus with diabetic neuropathy, unspecified: Secondary | ICD-10-CM | POA: Diagnosis not present

## 2016-11-19 DIAGNOSIS — J45909 Unspecified asthma, uncomplicated: Secondary | ICD-10-CM | POA: Diagnosis not present

## 2016-11-19 DIAGNOSIS — Z89421 Acquired absence of other right toe(s): Secondary | ICD-10-CM | POA: Diagnosis not present

## 2016-11-20 ENCOUNTER — Telehealth: Payer: Self-pay | Admitting: Family Medicine

## 2016-11-20 DIAGNOSIS — E1122 Type 2 diabetes mellitus with diabetic chronic kidney disease: Secondary | ICD-10-CM | POA: Diagnosis not present

## 2016-11-20 DIAGNOSIS — G4733 Obstructive sleep apnea (adult) (pediatric): Secondary | ICD-10-CM | POA: Diagnosis not present

## 2016-11-20 DIAGNOSIS — E1121 Type 2 diabetes mellitus with diabetic nephropathy: Secondary | ICD-10-CM | POA: Diagnosis not present

## 2016-11-20 DIAGNOSIS — E1140 Type 2 diabetes mellitus with diabetic neuropathy, unspecified: Secondary | ICD-10-CM | POA: Diagnosis not present

## 2016-11-20 DIAGNOSIS — H548 Legal blindness, as defined in USA: Secondary | ICD-10-CM | POA: Diagnosis not present

## 2016-11-20 DIAGNOSIS — Z4801 Encounter for change or removal of surgical wound dressing: Secondary | ICD-10-CM | POA: Diagnosis not present

## 2016-11-20 DIAGNOSIS — N183 Chronic kidney disease, stage 3 (moderate): Secondary | ICD-10-CM | POA: Diagnosis not present

## 2016-11-20 DIAGNOSIS — I129 Hypertensive chronic kidney disease with stage 1 through stage 4 chronic kidney disease, or unspecified chronic kidney disease: Secondary | ICD-10-CM | POA: Diagnosis not present

## 2016-11-20 DIAGNOSIS — Z89421 Acquired absence of other right toe(s): Secondary | ICD-10-CM | POA: Diagnosis not present

## 2016-11-20 DIAGNOSIS — Z4781 Encounter for orthopedic aftercare following surgical amputation: Secondary | ICD-10-CM | POA: Diagnosis not present

## 2016-11-20 DIAGNOSIS — D631 Anemia in chronic kidney disease: Secondary | ICD-10-CM | POA: Diagnosis not present

## 2016-11-20 DIAGNOSIS — Z794 Long term (current) use of insulin: Secondary | ICD-10-CM | POA: Diagnosis not present

## 2016-11-20 DIAGNOSIS — J45909 Unspecified asthma, uncomplicated: Secondary | ICD-10-CM | POA: Diagnosis not present

## 2016-11-20 DIAGNOSIS — E1151 Type 2 diabetes mellitus with diabetic peripheral angiopathy without gangrene: Secondary | ICD-10-CM | POA: Diagnosis not present

## 2016-11-20 NOTE — Telephone Encounter (Signed)
Alex Ryan w/[facility] would like verbal orders to continue therapy with pt for 2 for 4 weeks and pts Bp was164/98 the start of PT and pt state that he had not taken his medication but before the visit was over he had taken his Bp medication and Bp was good (did not give reading.

## 2016-11-20 NOTE — Telephone Encounter (Signed)
Per Dr. Sarajane Jews okay to give verbal order, I did call and leave a voice message for the order to continue therapy.

## 2016-11-24 ENCOUNTER — Other Ambulatory Visit: Payer: Self-pay | Admitting: *Deleted

## 2016-11-24 DIAGNOSIS — H548 Legal blindness, as defined in USA: Secondary | ICD-10-CM | POA: Diagnosis not present

## 2016-11-24 DIAGNOSIS — Z89421 Acquired absence of other right toe(s): Secondary | ICD-10-CM | POA: Diagnosis not present

## 2016-11-24 DIAGNOSIS — D631 Anemia in chronic kidney disease: Secondary | ICD-10-CM | POA: Diagnosis not present

## 2016-11-24 DIAGNOSIS — E1140 Type 2 diabetes mellitus with diabetic neuropathy, unspecified: Secondary | ICD-10-CM | POA: Diagnosis not present

## 2016-11-24 DIAGNOSIS — J45909 Unspecified asthma, uncomplicated: Secondary | ICD-10-CM | POA: Diagnosis not present

## 2016-11-24 DIAGNOSIS — D649 Anemia, unspecified: Secondary | ICD-10-CM | POA: Diagnosis not present

## 2016-11-24 DIAGNOSIS — Z4801 Encounter for change or removal of surgical wound dressing: Secondary | ICD-10-CM | POA: Diagnosis not present

## 2016-11-24 DIAGNOSIS — E1121 Type 2 diabetes mellitus with diabetic nephropathy: Secondary | ICD-10-CM | POA: Diagnosis not present

## 2016-11-24 DIAGNOSIS — Z794 Long term (current) use of insulin: Secondary | ICD-10-CM | POA: Diagnosis not present

## 2016-11-24 DIAGNOSIS — N183 Chronic kidney disease, stage 3 (moderate): Secondary | ICD-10-CM | POA: Diagnosis not present

## 2016-11-24 DIAGNOSIS — Z4781 Encounter for orthopedic aftercare following surgical amputation: Secondary | ICD-10-CM | POA: Diagnosis not present

## 2016-11-24 DIAGNOSIS — E1151 Type 2 diabetes mellitus with diabetic peripheral angiopathy without gangrene: Secondary | ICD-10-CM | POA: Diagnosis not present

## 2016-11-24 DIAGNOSIS — I129 Hypertensive chronic kidney disease with stage 1 through stage 4 chronic kidney disease, or unspecified chronic kidney disease: Secondary | ICD-10-CM | POA: Diagnosis not present

## 2016-11-24 DIAGNOSIS — G4733 Obstructive sleep apnea (adult) (pediatric): Secondary | ICD-10-CM | POA: Diagnosis not present

## 2016-11-24 DIAGNOSIS — E1122 Type 2 diabetes mellitus with diabetic chronic kidney disease: Secondary | ICD-10-CM | POA: Diagnosis not present

## 2016-11-24 NOTE — Patient Outreach (Signed)
[place] [facility]) Care Management  11/24/2016  TRAVEN DAVIDS [birth year] [id number]  Transition of care follow up  RN spoke with pt today and verified identifiers. RN inquired on pt's ongoing recovery since his recent discharged post amputation. Pt reports his kidney provider has been following him closely. States he is currently at [facility] out-patient clinic awaiting IV iron administration due to his low levels. Reports his CBG were elevated (300) earlier due to dietary habits but pt reports they have improved. Discussed the importance of adjusting his diet with healthy eating habits to avoid the risk of the long term effects.  Pt reports a busy schedule with [facility] visiting several days weekly (RN X 3 days and PT X 2 days). Pt also has pending appointments with his providers and biotech for a new prosthetic device. Other pending interventions related to pt possibly received a specialized glucometer (Dexcom continuous meter) pending insurance coverage.   RN requested further involvement around his current scheduled for possible home visit and requested another appointment telephone call to further completed the initial assessment. Pt receptive and RN requested to follow up tomorrow with a telephone appointment to completed and will scheduled a home visit at that time. Will follow up accordingly.  Raina Mina, RN Care Management Coordinator [place] Office [phone]

## 2016-11-25 ENCOUNTER — Ambulatory Visit: Payer: Self-pay | Admitting: *Deleted

## 2016-11-25 DIAGNOSIS — Z794 Long term (current) use of insulin: Secondary | ICD-10-CM | POA: Diagnosis not present

## 2016-11-25 DIAGNOSIS — G4733 Obstructive sleep apnea (adult) (pediatric): Secondary | ICD-10-CM | POA: Diagnosis not present

## 2016-11-25 DIAGNOSIS — I129 Hypertensive chronic kidney disease with stage 1 through stage 4 chronic kidney disease, or unspecified chronic kidney disease: Secondary | ICD-10-CM | POA: Diagnosis not present

## 2016-11-25 DIAGNOSIS — E1140 Type 2 diabetes mellitus with diabetic neuropathy, unspecified: Secondary | ICD-10-CM | POA: Diagnosis not present

## 2016-11-25 DIAGNOSIS — E1122 Type 2 diabetes mellitus with diabetic chronic kidney disease: Secondary | ICD-10-CM | POA: Diagnosis not present

## 2016-11-25 DIAGNOSIS — E1121 Type 2 diabetes mellitus with diabetic nephropathy: Secondary | ICD-10-CM | POA: Diagnosis not present

## 2016-11-25 DIAGNOSIS — N183 Chronic kidney disease, stage 3 (moderate): Secondary | ICD-10-CM | POA: Diagnosis not present

## 2016-11-25 DIAGNOSIS — E1151 Type 2 diabetes mellitus with diabetic peripheral angiopathy without gangrene: Secondary | ICD-10-CM | POA: Diagnosis not present

## 2016-11-25 DIAGNOSIS — D631 Anemia in chronic kidney disease: Secondary | ICD-10-CM | POA: Diagnosis not present

## 2016-11-25 DIAGNOSIS — Z4781 Encounter for orthopedic aftercare following surgical amputation: Secondary | ICD-10-CM | POA: Diagnosis not present

## 2016-11-25 DIAGNOSIS — Z4801 Encounter for change or removal of surgical wound dressing: Secondary | ICD-10-CM | POA: Diagnosis not present

## 2016-11-25 DIAGNOSIS — Z89421 Acquired absence of other right toe(s): Secondary | ICD-10-CM | POA: Diagnosis not present

## 2016-11-25 DIAGNOSIS — H548 Legal blindness, as defined in USA: Secondary | ICD-10-CM | POA: Diagnosis not present

## 2016-11-25 DIAGNOSIS — J45909 Unspecified asthma, uncomplicated: Secondary | ICD-10-CM | POA: Diagnosis not present

## 2016-11-26 ENCOUNTER — Other Ambulatory Visit: Payer: Self-pay | Admitting: *Deleted

## 2016-11-26 ENCOUNTER — Encounter: Payer: Self-pay | Admitting: *Deleted

## 2016-11-26 DIAGNOSIS — Z794 Long term (current) use of insulin: Secondary | ICD-10-CM | POA: Diagnosis not present

## 2016-11-26 DIAGNOSIS — Z4801 Encounter for change or removal of surgical wound dressing: Secondary | ICD-10-CM | POA: Diagnosis not present

## 2016-11-26 DIAGNOSIS — I129 Hypertensive chronic kidney disease with stage 1 through stage 4 chronic kidney disease, or unspecified chronic kidney disease: Secondary | ICD-10-CM | POA: Diagnosis not present

## 2016-11-26 DIAGNOSIS — E1140 Type 2 diabetes mellitus with diabetic neuropathy, unspecified: Secondary | ICD-10-CM | POA: Diagnosis not present

## 2016-11-26 DIAGNOSIS — Z89421 Acquired absence of other right toe(s): Secondary | ICD-10-CM | POA: Diagnosis not present

## 2016-11-26 DIAGNOSIS — E1151 Type 2 diabetes mellitus with diabetic peripheral angiopathy without gangrene: Secondary | ICD-10-CM | POA: Diagnosis not present

## 2016-11-26 DIAGNOSIS — N183 Chronic kidney disease, stage 3 (moderate): Secondary | ICD-10-CM | POA: Diagnosis not present

## 2016-11-26 DIAGNOSIS — Z89511 Acquired absence of right leg below knee: Secondary | ICD-10-CM | POA: Diagnosis not present

## 2016-11-26 DIAGNOSIS — J45909 Unspecified asthma, uncomplicated: Secondary | ICD-10-CM | POA: Diagnosis not present

## 2016-11-26 DIAGNOSIS — D631 Anemia in chronic kidney disease: Secondary | ICD-10-CM | POA: Diagnosis not present

## 2016-11-26 DIAGNOSIS — H548 Legal blindness, as defined in USA: Secondary | ICD-10-CM | POA: Diagnosis not present

## 2016-11-26 DIAGNOSIS — Z4781 Encounter for orthopedic aftercare following surgical amputation: Secondary | ICD-10-CM | POA: Diagnosis not present

## 2016-11-26 DIAGNOSIS — G4733 Obstructive sleep apnea (adult) (pediatric): Secondary | ICD-10-CM | POA: Diagnosis not present

## 2016-11-26 DIAGNOSIS — E1122 Type 2 diabetes mellitus with diabetic chronic kidney disease: Secondary | ICD-10-CM | POA: Diagnosis not present

## 2016-11-26 DIAGNOSIS — E1121 Type 2 diabetes mellitus with diabetic nephropathy: Secondary | ICD-10-CM | POA: Diagnosis not present

## 2016-11-26 NOTE — Patient Outreach (Signed)
[place] [facility]) Care Management  11/26/2016  Alex Ryan 1971/04/06 [id number]    RN spoke with pt today and completed the initial assessment. Pt remains receptive to [facility] services and indicated his ongoing services he receives from [facility] for nursing and PT services (post-op amputee). States he will need 3 prosthetic for his amputee and will began wearing his sock shrinker for his stump. RN discussed the current plan of care related to his ongoing diabetes and verified pt's adherence since his recent hospitalization. Pt reports his readings for his AM reading 232 and states he waiting to late to take his CBG. Overall reports his average readings range from 100-131. Pt also states his upcoming A1C in September with his endocrinologist (Dr. Dwyane Dee). RN stress the importance of adherence related to his dietary habits and daily monitoring. Pt is aware of the risk involved if he remains non-adherent with his diabetes. RN offered a home visit now that he is more aware of his scheduled appointments with the involved [place] agency. Pt agreeable to a scheduled appointment for next Monday. Will scheduled and follow up accordingly for [facility] services. Will also provider printed material on pt's diabetes and healthy eating habits. Pt receptive and verbalized his appreciation for the upcoming services.  Raina Mina, RN Care Management Coordinator [place] Office [phone]

## 2016-11-27 DIAGNOSIS — J45909 Unspecified asthma, uncomplicated: Secondary | ICD-10-CM | POA: Diagnosis not present

## 2016-11-27 DIAGNOSIS — N183 Chronic kidney disease, stage 3 (moderate): Secondary | ICD-10-CM | POA: Diagnosis not present

## 2016-11-27 DIAGNOSIS — G4733 Obstructive sleep apnea (adult) (pediatric): Secondary | ICD-10-CM | POA: Diagnosis not present

## 2016-11-27 DIAGNOSIS — Z89511 Acquired absence of right leg below knee: Secondary | ICD-10-CM | POA: Diagnosis not present

## 2016-11-27 DIAGNOSIS — E1122 Type 2 diabetes mellitus with diabetic chronic kidney disease: Secondary | ICD-10-CM | POA: Diagnosis not present

## 2016-11-27 DIAGNOSIS — E1140 Type 2 diabetes mellitus with diabetic neuropathy, unspecified: Secondary | ICD-10-CM | POA: Diagnosis not present

## 2016-11-27 DIAGNOSIS — E1151 Type 2 diabetes mellitus with diabetic peripheral angiopathy without gangrene: Secondary | ICD-10-CM | POA: Diagnosis not present

## 2016-11-27 DIAGNOSIS — Z89421 Acquired absence of other right toe(s): Secondary | ICD-10-CM | POA: Diagnosis not present

## 2016-11-27 DIAGNOSIS — E1121 Type 2 diabetes mellitus with diabetic nephropathy: Secondary | ICD-10-CM | POA: Diagnosis not present

## 2016-11-27 DIAGNOSIS — H548 Legal blindness, as defined in USA: Secondary | ICD-10-CM | POA: Diagnosis not present

## 2016-11-27 DIAGNOSIS — Z4781 Encounter for orthopedic aftercare following surgical amputation: Secondary | ICD-10-CM | POA: Diagnosis not present

## 2016-11-27 DIAGNOSIS — I129 Hypertensive chronic kidney disease with stage 1 through stage 4 chronic kidney disease, or unspecified chronic kidney disease: Secondary | ICD-10-CM | POA: Diagnosis not present

## 2016-11-27 DIAGNOSIS — D631 Anemia in chronic kidney disease: Secondary | ICD-10-CM | POA: Diagnosis not present

## 2016-11-27 DIAGNOSIS — Z794 Long term (current) use of insulin: Secondary | ICD-10-CM | POA: Diagnosis not present

## 2016-11-27 DIAGNOSIS — Z4801 Encounter for change or removal of surgical wound dressing: Secondary | ICD-10-CM | POA: Diagnosis not present

## 2016-11-30 ENCOUNTER — Other Ambulatory Visit: Payer: Self-pay | Admitting: *Deleted

## 2016-11-30 DIAGNOSIS — Z89421 Acquired absence of other right toe(s): Secondary | ICD-10-CM | POA: Diagnosis not present

## 2016-11-30 DIAGNOSIS — Z794 Long term (current) use of insulin: Secondary | ICD-10-CM | POA: Diagnosis not present

## 2016-11-30 DIAGNOSIS — H548 Legal blindness, as defined in USA: Secondary | ICD-10-CM | POA: Diagnosis not present

## 2016-11-30 DIAGNOSIS — Z4781 Encounter for orthopedic aftercare following surgical amputation: Secondary | ICD-10-CM | POA: Diagnosis not present

## 2016-11-30 DIAGNOSIS — D631 Anemia in chronic kidney disease: Secondary | ICD-10-CM | POA: Diagnosis not present

## 2016-11-30 DIAGNOSIS — E1122 Type 2 diabetes mellitus with diabetic chronic kidney disease: Secondary | ICD-10-CM | POA: Diagnosis not present

## 2016-11-30 DIAGNOSIS — Z4801 Encounter for change or removal of surgical wound dressing: Secondary | ICD-10-CM | POA: Diagnosis not present

## 2016-11-30 DIAGNOSIS — E1151 Type 2 diabetes mellitus with diabetic peripheral angiopathy without gangrene: Secondary | ICD-10-CM | POA: Diagnosis not present

## 2016-11-30 DIAGNOSIS — G4733 Obstructive sleep apnea (adult) (pediatric): Secondary | ICD-10-CM | POA: Diagnosis not present

## 2016-11-30 DIAGNOSIS — J45909 Unspecified asthma, uncomplicated: Secondary | ICD-10-CM | POA: Diagnosis not present

## 2016-11-30 DIAGNOSIS — E1140 Type 2 diabetes mellitus with diabetic neuropathy, unspecified: Secondary | ICD-10-CM | POA: Diagnosis not present

## 2016-11-30 DIAGNOSIS — I129 Hypertensive chronic kidney disease with stage 1 through stage 4 chronic kidney disease, or unspecified chronic kidney disease: Secondary | ICD-10-CM | POA: Diagnosis not present

## 2016-11-30 DIAGNOSIS — N183 Chronic kidney disease, stage 3 (moderate): Secondary | ICD-10-CM | POA: Diagnosis not present

## 2016-11-30 DIAGNOSIS — E1121 Type 2 diabetes mellitus with diabetic nephropathy: Secondary | ICD-10-CM | POA: Diagnosis not present

## 2016-11-30 NOTE — Patient Outreach (Signed)
[facility] ([facility]) Care Management   11/30/2016  Alex Ryan 03/19/1971 [id number]  Alex Ryan is an 46 y.o. male  Subjective:   DM:  Pt reports his CBGs are good ranging from 70-140 with the AM fasting read. States he currently obtains 5-6 readings daily. Reports some spikes at 200 and pt states he drinks a lot of H2O and takes his insulin. Pt states he is aware of what he is eating and tries to avoids certain foods that elevates his blood sugars. Pt reports he and his provider continue to work on obtain the Dexcom (real time glucose reader) however issues with the coverage. Pt feels this will help with regulating his blood sugars. HTN: Pt states his BP is always higher on the right side then the left and he has not had his morning BP medication with the elevation noted today. Pt denies any symptoms and will take his medications to prevent ongoing increase. NUTRITION: Pt reports changes with eating more baked goods, avoid buffees, consumes lots of vegetables and and attempts to watch his portion sizes.  Reports also making changes from fried foods to more baked foods.  EDEMA: Pt reports ongoing edema to his to his extremities with more to the operative leg (history of bad circulation). Pt reports ongoing [facility] therapy working with reducing such swelling. Reports the swelling has improved but a slow process. Currently received compression dressing to his left lower leg and right stump area due to his ongoing lipoedema. Pt states he is pending compression stockings from his provider for proper measurements. ANEMIA: Pt reports ongoing IV iron therapy and he will receive a treatment tomorrow morning ([facility]). Future therapy pending based upon the results. [facility]: [facility] involved with RN-M/W/F and PT (twice weekly). Pt reports very intense with his therapy and he will began speical services with [facility] for lipoedema therapy next week.  Objective:   Review of  Systems  Constitutional: Negative.   HENT: Negative.   Eyes: Negative.   Respiratory: Negative.   Cardiovascular: Negative.        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   Gastrointestinal: Negative.   Genitourinary: Negative.   Musculoskeletal: Negative.   Skin: Negative.   Neurological: Negative.   Endo/Heme/Allergies: Negative.   Psychiatric/Behavioral: Negative.     Physical Exam  Constitutional: He is oriented to person, place, and time. He appears well-developed and well-nourished.  HENT:  Right Ear: External ear normal.  Left Ear: External ear normal.  Eyes: EOM are normal.  Neck: Normal range of motion.  Cardiovascular: Normal heart sounds.   Respiratory: Effort normal and breath sounds normal.  GI: Soft. Bowel sounds are normal.  Musculoskeletal: Normal range of motion.  Neurological: He is alert and oriented to person, place, and time.  Skin: Skin is warm and dry.  Psychiatric: He has a normal mood and affect. His behavior is normal. Judgment and thought content normal.    Encounter Medications:   Outpatient Encounter Prescriptions as of 11/30/2016  Medication Sig  . albuterol (PROVENTIL HFA;VENTOLIN HFA) 108 (90 Base) MCG/ACT inhaler Inhale 2 puffs into the lungs every 4 (four) hours as needed for wheezing or shortness of breath. PRN:  Allergies/breathing  . Alcohol Swabs PADS Use up to 10 times per day for testing and insulin injections, diagnosis code is 250.00  . ALPRAZolam (XANAX) 0.25 MG tablet Take 1 tablet (0.25 mg total) by mouth 2 (two) times daily as needed for   [facility] ([facility] Care Management   11/30/2016  Alex Ryan 03/19/1971 [id number]  Alex Ryan is an 46 y.o. male  Subjective:   DM:  Pt reports his CBGs are good ranging from 70-140 with the AM fasting read. States he currently obtains 5-6 readings daily. Reports some spikes at 200 and pt states he drinks a lot of H2O and takes his insulin. Pt states he is aware of what he is eating and tries to avoids certain foods that elevates his blood sugars. Pt reports he and his provider continue to work on obtain the Dexcom (real time glucose reader) however issues with the coverage. Pt feels this will help with regulating his blood sugars. HTN: Pt states his BP is always higher on the right side then the left and he has not had his morning BP medication with the elevation noted today. Pt denies any symptoms and will take his medications to prevent ongoing increase. NUTRITION: Pt reports changes with eating more baked goods, avoid buffees, consumes lots of vegetables and and attempts to watch his portion sizes.  Reports also making changes from fried foods to more baked foods.  EDEMA: Pt reports ongoing edema to his to his extremities with more to the operative leg (history of bad circulation). Pt reports ongoing [facility] therapy working with reducing such swelling. Reports the swelling has improved but a slow process. Currently received compression dressing to his left lower leg and right stump area due to his ongoing lipoedema. Pt states he is pending compression stockings from his provider for proper measurements. ANEMIA: Pt reports ongoing IV iron therapy and he will receive a treatment tomorrow morning ([facility]). Future therapy pending based upon the results. [facility]: [facility] involved with RN-M/W/F and PT (twice weekly). Pt reports very intense with his therapy and he will began speical services with [facility] for lipoedema therapy next week.  Objective:   Review of  Systems  Constitutional: Negative.   HENT: Negative.   Eyes: Negative.   Respiratory: Negative.   Cardiovascular: Negative.        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   Gastrointestinal: Negative.   Genitourinary: Negative.   Musculoskeletal: Negative.   Skin: Negative.   Neurological: Negative.   Endo/Heme/Allergies: Negative.   Psychiatric/Behavioral: Negative.     Physical Exam  Constitutional: He is oriented to person, place, and time. He appears well-developed and well-nourished.  HENT:  Right Ear: External ear normal.  Left Ear: External ear normal.  Eyes: EOM are normal.  Neck: Normal range of motion.  Cardiovascular: Normal heart sounds.   Respiratory: Effort normal and breath sounds normal.  GI: Soft. Bowel sounds are normal.  Musculoskeletal: Normal range of motion.  Neurological: He is alert and oriented to person, place, and time.  Skin: Skin is warm and dry.  Psychiatric: He has a normal mood and affect. His behavior is normal. Judgment and thought content normal.    Encounter Medications:   Outpatient Encounter Prescriptions as of 11/30/2016  Medication Sig  . albuterol (PROVENTIL HFA;VENTOLIN HFA) 108 (90 Base) MCG/ACT inhaler Inhale 2 puffs into the lungs every 4 (four) hours as needed for wheezing or shortness of breath. PRN:  Allergies/breathing  . Alcohol Swabs PADS Use up to 10 times per day for testing and insulin injections, diagnosis code is 250.00  . ALPRAZolam (XANAX) 0.25 MG tablet Take 1 tablet (0.25 mg total) by mouth 2 (two) times daily as needed for   [facility] Care Management   11/30/2016  Alex Ryan 03/19/1971 [id number]  Alex Ryan is an 46 y.o. male  Subjective:   DM:  Pt reports his CBGs are good ranging from 70-140 with the AM fasting read. States he currently obtains 5-6 readings daily. Reports some spikes at 200 and pt states he drinks a lot of H2O and takes his insulin. Pt states he is aware of what he is eating and tries to avoids certain foods that elevates his blood sugars. Pt reports he and his provider continue to work on obtain the Dexcom (real time glucose reader) however issues with the coverage. Pt feels this will help with regulating his blood sugars. HTN: Pt states his BP is always higher on the right side then the left and he has not had his morning BP medication with the elevation noted today. Pt denies any symptoms and will take his medications to prevent ongoing increase. NUTRITION: Pt reports changes with eating more baked goods, avoid buffees, consumes lots of vegetables and and attempts to watch his portion sizes.  Reports also making changes from fried foods to more baked foods.  EDEMA: Pt reports ongoing edema to his to his extremities with more to the operative leg (history of bad circulation). Pt reports ongoing [facility] therapy working with reducing such swelling. Reports the swelling has improved but a slow process. Currently received compression dressing to his left lower leg and right stump area due to his ongoing lipoedema. Pt states he is pending compression stockings from his provider for proper measurements. ANEMIA: Pt reports ongoing IV iron therapy and he will receive a treatment tomorrow morning ([facility]). Future therapy pending based upon the results. [facility]: [facility] involved with RN-M/W/F and PT (twice weekly). Pt reports very intense with his therapy and he will began speical services with [facility] for lipoedema therapy next week.  Objective:   Review of  Systems  Constitutional: Negative.   HENT: Negative.   Eyes: Negative.   Respiratory: Negative.   Cardiovascular: Negative.        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   Gastrointestinal: Negative.   Genitourinary: Negative.   Musculoskeletal: Negative.   Skin: Negative.   Neurological: Negative.   Endo/Heme/Allergies: Negative.   Psychiatric/Behavioral: Negative.     Physical Exam  Constitutional: He is oriented to person, place, and time. He appears well-developed and well-nourished.  HENT:  Right Ear: External ear normal.  Left Ear: External ear normal.  Eyes: EOM are normal.  Neck: Normal range of motion.  Cardiovascular: Normal heart sounds.   Respiratory: Effort normal and breath sounds normal.  GI: Soft. Bowel sounds are normal.  Musculoskeletal: Normal range of motion.  Neurological: He is alert and oriented to person, place, and time.  Skin: Skin is warm and dry.  Psychiatric: He has a normal mood and affect. His behavior is normal. Judgment and thought content normal.    Encounter Medications:   Outpatient Encounter Prescriptions as of 11/30/2016  Medication Sig  . albuterol (PROVENTIL HFA;VENTOLIN HFA) 108 (90 Base) MCG/ACT inhaler Inhale 2 puffs into the lungs every 4 (four) hours as needed for wheezing or shortness of breath. PRN:  Allergies/breathing  . Alcohol Swabs PADS Use up to 10 times per day for testing and insulin injections, diagnosis code is 250.00  . ALPRAZolam (XANAX) 0.25 MG tablet Take 1 tablet (0.25 mg total) by mouth 2 (two) times daily as needed for

## 2016-12-01 ENCOUNTER — Telehealth: Payer: Self-pay | Admitting: Family Medicine

## 2016-12-01 ENCOUNTER — Encounter: Payer: Self-pay | Admitting: *Deleted

## 2016-12-01 DIAGNOSIS — I129 Hypertensive chronic kidney disease with stage 1 through stage 4 chronic kidney disease, or unspecified chronic kidney disease: Secondary | ICD-10-CM | POA: Diagnosis not present

## 2016-12-01 DIAGNOSIS — E1121 Type 2 diabetes mellitus with diabetic nephropathy: Secondary | ICD-10-CM | POA: Diagnosis not present

## 2016-12-01 DIAGNOSIS — Z89421 Acquired absence of other right toe(s): Secondary | ICD-10-CM | POA: Diagnosis not present

## 2016-12-01 DIAGNOSIS — E1140 Type 2 diabetes mellitus with diabetic neuropathy, unspecified: Secondary | ICD-10-CM | POA: Diagnosis not present

## 2016-12-01 DIAGNOSIS — Z794 Long term (current) use of insulin: Secondary | ICD-10-CM | POA: Diagnosis not present

## 2016-12-01 DIAGNOSIS — E1151 Type 2 diabetes mellitus with diabetic peripheral angiopathy without gangrene: Secondary | ICD-10-CM | POA: Diagnosis not present

## 2016-12-01 DIAGNOSIS — E1122 Type 2 diabetes mellitus with diabetic chronic kidney disease: Secondary | ICD-10-CM | POA: Diagnosis not present

## 2016-12-01 DIAGNOSIS — H548 Legal blindness, as defined in USA: Secondary | ICD-10-CM | POA: Diagnosis not present

## 2016-12-01 DIAGNOSIS — N183 Chronic kidney disease, stage 3 (moderate): Secondary | ICD-10-CM | POA: Diagnosis not present

## 2016-12-01 DIAGNOSIS — D631 Anemia in chronic kidney disease: Secondary | ICD-10-CM | POA: Diagnosis not present

## 2016-12-01 DIAGNOSIS — J45909 Unspecified asthma, uncomplicated: Secondary | ICD-10-CM | POA: Diagnosis not present

## 2016-12-01 DIAGNOSIS — D649 Anemia, unspecified: Secondary | ICD-10-CM | POA: Diagnosis not present

## 2016-12-01 DIAGNOSIS — Z4781 Encounter for orthopedic aftercare following surgical amputation: Secondary | ICD-10-CM | POA: Diagnosis not present

## 2016-12-01 DIAGNOSIS — Z4801 Encounter for change or removal of surgical wound dressing: Secondary | ICD-10-CM | POA: Diagnosis not present

## 2016-12-01 DIAGNOSIS — G4733 Obstructive sleep apnea (adult) (pediatric): Secondary | ICD-10-CM | POA: Diagnosis not present

## 2016-12-01 NOTE — Telephone Encounter (Signed)
I spoke with pt, gave below advice and updated change on medication chart.

## 2016-12-01 NOTE — Telephone Encounter (Signed)
Pt is having problems with is Bp this morning it was 190/100 and this was before the medication had time to take effect.  Pt state that it is going up and staying up.  Triage was not available.

## 2016-12-01 NOTE — Telephone Encounter (Signed)
Tell him to increase the Metoprolol to BID

## 2016-12-02 DIAGNOSIS — Z794 Long term (current) use of insulin: Secondary | ICD-10-CM | POA: Diagnosis not present

## 2016-12-02 DIAGNOSIS — Z4801 Encounter for change or removal of surgical wound dressing: Secondary | ICD-10-CM | POA: Diagnosis not present

## 2016-12-02 DIAGNOSIS — E1151 Type 2 diabetes mellitus with diabetic peripheral angiopathy without gangrene: Secondary | ICD-10-CM | POA: Diagnosis not present

## 2016-12-02 DIAGNOSIS — N183 Chronic kidney disease, stage 3 (moderate): Secondary | ICD-10-CM | POA: Diagnosis not present

## 2016-12-02 DIAGNOSIS — E1140 Type 2 diabetes mellitus with diabetic neuropathy, unspecified: Secondary | ICD-10-CM | POA: Diagnosis not present

## 2016-12-02 DIAGNOSIS — D631 Anemia in chronic kidney disease: Secondary | ICD-10-CM | POA: Diagnosis not present

## 2016-12-02 DIAGNOSIS — I129 Hypertensive chronic kidney disease with stage 1 through stage 4 chronic kidney disease, or unspecified chronic kidney disease: Secondary | ICD-10-CM | POA: Diagnosis not present

## 2016-12-02 DIAGNOSIS — Z89421 Acquired absence of other right toe(s): Secondary | ICD-10-CM | POA: Diagnosis not present

## 2016-12-02 DIAGNOSIS — H548 Legal blindness, as defined in USA: Secondary | ICD-10-CM | POA: Diagnosis not present

## 2016-12-02 DIAGNOSIS — Z4781 Encounter for orthopedic aftercare following surgical amputation: Secondary | ICD-10-CM | POA: Diagnosis not present

## 2016-12-02 DIAGNOSIS — E1122 Type 2 diabetes mellitus with diabetic chronic kidney disease: Secondary | ICD-10-CM | POA: Diagnosis not present

## 2016-12-02 DIAGNOSIS — G4733 Obstructive sleep apnea (adult) (pediatric): Secondary | ICD-10-CM | POA: Diagnosis not present

## 2016-12-02 DIAGNOSIS — J45909 Unspecified asthma, uncomplicated: Secondary | ICD-10-CM | POA: Diagnosis not present

## 2016-12-02 DIAGNOSIS — E1121 Type 2 diabetes mellitus with diabetic nephropathy: Secondary | ICD-10-CM | POA: Diagnosis not present

## 2016-12-03 DIAGNOSIS — D631 Anemia in chronic kidney disease: Secondary | ICD-10-CM | POA: Diagnosis not present

## 2016-12-03 DIAGNOSIS — E1151 Type 2 diabetes mellitus with diabetic peripheral angiopathy without gangrene: Secondary | ICD-10-CM | POA: Diagnosis not present

## 2016-12-03 DIAGNOSIS — H548 Legal blindness, as defined in USA: Secondary | ICD-10-CM | POA: Diagnosis not present

## 2016-12-03 DIAGNOSIS — Z794 Long term (current) use of insulin: Secondary | ICD-10-CM | POA: Diagnosis not present

## 2016-12-03 DIAGNOSIS — Z89421 Acquired absence of other right toe(s): Secondary | ICD-10-CM | POA: Diagnosis not present

## 2016-12-03 DIAGNOSIS — E1121 Type 2 diabetes mellitus with diabetic nephropathy: Secondary | ICD-10-CM | POA: Diagnosis not present

## 2016-12-03 DIAGNOSIS — Z4801 Encounter for change or removal of surgical wound dressing: Secondary | ICD-10-CM | POA: Diagnosis not present

## 2016-12-03 DIAGNOSIS — J45909 Unspecified asthma, uncomplicated: Secondary | ICD-10-CM | POA: Diagnosis not present

## 2016-12-03 DIAGNOSIS — E1140 Type 2 diabetes mellitus with diabetic neuropathy, unspecified: Secondary | ICD-10-CM | POA: Diagnosis not present

## 2016-12-03 DIAGNOSIS — G4733 Obstructive sleep apnea (adult) (pediatric): Secondary | ICD-10-CM | POA: Diagnosis not present

## 2016-12-03 DIAGNOSIS — Z4781 Encounter for orthopedic aftercare following surgical amputation: Secondary | ICD-10-CM | POA: Diagnosis not present

## 2016-12-03 DIAGNOSIS — N183 Chronic kidney disease, stage 3 (moderate): Secondary | ICD-10-CM | POA: Diagnosis not present

## 2016-12-03 DIAGNOSIS — E1122 Type 2 diabetes mellitus with diabetic chronic kidney disease: Secondary | ICD-10-CM | POA: Diagnosis not present

## 2016-12-03 DIAGNOSIS — I129 Hypertensive chronic kidney disease with stage 1 through stage 4 chronic kidney disease, or unspecified chronic kidney disease: Secondary | ICD-10-CM | POA: Diagnosis not present

## 2016-12-04 ENCOUNTER — Telehealth: Payer: Self-pay

## 2016-12-04 DIAGNOSIS — E1121 Type 2 diabetes mellitus with diabetic nephropathy: Secondary | ICD-10-CM | POA: Diagnosis not present

## 2016-12-04 DIAGNOSIS — Z4781 Encounter for orthopedic aftercare following surgical amputation: Secondary | ICD-10-CM | POA: Diagnosis not present

## 2016-12-04 DIAGNOSIS — G4733 Obstructive sleep apnea (adult) (pediatric): Secondary | ICD-10-CM | POA: Diagnosis not present

## 2016-12-04 DIAGNOSIS — J45909 Unspecified asthma, uncomplicated: Secondary | ICD-10-CM | POA: Diagnosis not present

## 2016-12-04 DIAGNOSIS — Z794 Long term (current) use of insulin: Secondary | ICD-10-CM | POA: Diagnosis not present

## 2016-12-04 DIAGNOSIS — E1151 Type 2 diabetes mellitus with diabetic peripheral angiopathy without gangrene: Secondary | ICD-10-CM | POA: Diagnosis not present

## 2016-12-04 DIAGNOSIS — Z89421 Acquired absence of other right toe(s): Secondary | ICD-10-CM | POA: Diagnosis not present

## 2016-12-04 DIAGNOSIS — H548 Legal blindness, as defined in USA: Secondary | ICD-10-CM | POA: Diagnosis not present

## 2016-12-04 DIAGNOSIS — Z4801 Encounter for change or removal of surgical wound dressing: Secondary | ICD-10-CM | POA: Diagnosis not present

## 2016-12-04 DIAGNOSIS — E1122 Type 2 diabetes mellitus with diabetic chronic kidney disease: Secondary | ICD-10-CM | POA: Diagnosis not present

## 2016-12-04 DIAGNOSIS — E1140 Type 2 diabetes mellitus with diabetic neuropathy, unspecified: Secondary | ICD-10-CM | POA: Diagnosis not present

## 2016-12-04 DIAGNOSIS — N183 Chronic kidney disease, stage 3 (moderate): Secondary | ICD-10-CM | POA: Diagnosis not present

## 2016-12-04 DIAGNOSIS — I129 Hypertensive chronic kidney disease with stage 1 through stage 4 chronic kidney disease, or unspecified chronic kidney disease: Secondary | ICD-10-CM | POA: Diagnosis not present

## 2016-12-04 DIAGNOSIS — D631 Anemia in chronic kidney disease: Secondary | ICD-10-CM | POA: Diagnosis not present

## 2016-12-04 NOTE — Telephone Encounter (Signed)
Called patient and advised that he needed to make an appointment for his new supplier paperwork to be filed out. Will place this form on [place] desk to hold until his appointment. Patient understood, had no questions.

## 2016-12-07 DIAGNOSIS — G4733 Obstructive sleep apnea (adult) (pediatric): Secondary | ICD-10-CM | POA: Diagnosis not present

## 2016-12-07 DIAGNOSIS — I10 Essential (primary) hypertension: Secondary | ICD-10-CM | POA: Diagnosis not present

## 2016-12-07 DIAGNOSIS — Z4781 Encounter for orthopedic aftercare following surgical amputation: Secondary | ICD-10-CM | POA: Diagnosis not present

## 2016-12-07 DIAGNOSIS — E1151 Type 2 diabetes mellitus with diabetic peripheral angiopathy without gangrene: Secondary | ICD-10-CM | POA: Diagnosis not present

## 2016-12-07 DIAGNOSIS — D631 Anemia in chronic kidney disease: Secondary | ICD-10-CM | POA: Diagnosis not present

## 2016-12-07 DIAGNOSIS — H548 Legal blindness, as defined in USA: Secondary | ICD-10-CM | POA: Diagnosis not present

## 2016-12-07 DIAGNOSIS — Z4801 Encounter for change or removal of surgical wound dressing: Secondary | ICD-10-CM | POA: Diagnosis not present

## 2016-12-07 DIAGNOSIS — J45909 Unspecified asthma, uncomplicated: Secondary | ICD-10-CM | POA: Diagnosis not present

## 2016-12-07 DIAGNOSIS — N183 Chronic kidney disease, stage 3 (moderate): Secondary | ICD-10-CM | POA: Diagnosis not present

## 2016-12-07 DIAGNOSIS — M24572 Contracture, left ankle: Secondary | ICD-10-CM | POA: Diagnosis not present

## 2016-12-07 DIAGNOSIS — B351 Tinea unguium: Secondary | ICD-10-CM | POA: Diagnosis not present

## 2016-12-07 DIAGNOSIS — Z794 Long term (current) use of insulin: Secondary | ICD-10-CM | POA: Diagnosis not present

## 2016-12-07 DIAGNOSIS — I129 Hypertensive chronic kidney disease with stage 1 through stage 4 chronic kidney disease, or unspecified chronic kidney disease: Secondary | ICD-10-CM | POA: Diagnosis not present

## 2016-12-07 DIAGNOSIS — E1142 Type 2 diabetes mellitus with diabetic polyneuropathy: Secondary | ICD-10-CM | POA: Diagnosis not present

## 2016-12-07 DIAGNOSIS — E1122 Type 2 diabetes mellitus with diabetic chronic kidney disease: Secondary | ICD-10-CM | POA: Diagnosis not present

## 2016-12-07 DIAGNOSIS — E1121 Type 2 diabetes mellitus with diabetic nephropathy: Secondary | ICD-10-CM | POA: Diagnosis not present

## 2016-12-07 DIAGNOSIS — M2012 Hallux valgus (acquired), left foot: Secondary | ICD-10-CM | POA: Diagnosis not present

## 2016-12-07 DIAGNOSIS — Z89421 Acquired absence of other right toe(s): Secondary | ICD-10-CM | POA: Diagnosis not present

## 2016-12-07 DIAGNOSIS — E1140 Type 2 diabetes mellitus with diabetic neuropathy, unspecified: Secondary | ICD-10-CM | POA: Diagnosis not present

## 2016-12-09 ENCOUNTER — Other Ambulatory Visit: Payer: Self-pay | Admitting: *Deleted

## 2016-12-09 DIAGNOSIS — E1122 Type 2 diabetes mellitus with diabetic chronic kidney disease: Secondary | ICD-10-CM | POA: Diagnosis not present

## 2016-12-09 DIAGNOSIS — G4733 Obstructive sleep apnea (adult) (pediatric): Secondary | ICD-10-CM | POA: Diagnosis not present

## 2016-12-09 DIAGNOSIS — E1151 Type 2 diabetes mellitus with diabetic peripheral angiopathy without gangrene: Secondary | ICD-10-CM | POA: Diagnosis not present

## 2016-12-09 DIAGNOSIS — N183 Chronic kidney disease, stage 3 (moderate): Secondary | ICD-10-CM | POA: Diagnosis not present

## 2016-12-09 DIAGNOSIS — Z89421 Acquired absence of other right toe(s): Secondary | ICD-10-CM | POA: Diagnosis not present

## 2016-12-09 DIAGNOSIS — Z794 Long term (current) use of insulin: Secondary | ICD-10-CM | POA: Diagnosis not present

## 2016-12-09 DIAGNOSIS — E1121 Type 2 diabetes mellitus with diabetic nephropathy: Secondary | ICD-10-CM | POA: Diagnosis not present

## 2016-12-09 DIAGNOSIS — I10 Essential (primary) hypertension: Secondary | ICD-10-CM | POA: Diagnosis not present

## 2016-12-09 DIAGNOSIS — Z4781 Encounter for orthopedic aftercare following surgical amputation: Secondary | ICD-10-CM | POA: Diagnosis not present

## 2016-12-09 DIAGNOSIS — E1140 Type 2 diabetes mellitus with diabetic neuropathy, unspecified: Secondary | ICD-10-CM | POA: Diagnosis not present

## 2016-12-09 DIAGNOSIS — I129 Hypertensive chronic kidney disease with stage 1 through stage 4 chronic kidney disease, or unspecified chronic kidney disease: Secondary | ICD-10-CM | POA: Diagnosis not present

## 2016-12-09 DIAGNOSIS — D631 Anemia in chronic kidney disease: Secondary | ICD-10-CM | POA: Diagnosis not present

## 2016-12-09 DIAGNOSIS — J4520 Mild intermittent asthma, uncomplicated: Secondary | ICD-10-CM | POA: Diagnosis not present

## 2016-12-09 DIAGNOSIS — E1165 Type 2 diabetes mellitus with hyperglycemia: Secondary | ICD-10-CM | POA: Diagnosis not present

## 2016-12-09 DIAGNOSIS — Z4801 Encounter for change or removal of surgical wound dressing: Secondary | ICD-10-CM | POA: Diagnosis not present

## 2016-12-09 DIAGNOSIS — J45909 Unspecified asthma, uncomplicated: Secondary | ICD-10-CM | POA: Diagnosis not present

## 2016-12-09 DIAGNOSIS — H548 Legal blindness, as defined in USA: Secondary | ICD-10-CM | POA: Diagnosis not present

## 2016-12-09 DIAGNOSIS — E1142 Type 2 diabetes mellitus with diabetic polyneuropathy: Secondary | ICD-10-CM | POA: Diagnosis not present

## 2016-12-09 NOTE — Patient Outreach (Signed)
[facility]) Care Management  12/09/2016  AJAI HARVILLE [birth year] [id number]   RN attempted to outreach to pt for ongoing transition of care call however pt currently on a doctor's appointment. RN offered to contact pt tomorrow. Will complete transition of care call at that time (pt receptive).  Raina Mina, RN Care Management Coordinator [place] Office [phone]

## 2016-12-10 ENCOUNTER — Ambulatory Visit: Payer: Self-pay | Admitting: *Deleted

## 2016-12-10 DIAGNOSIS — H548 Legal blindness, as defined in USA: Secondary | ICD-10-CM | POA: Diagnosis not present

## 2016-12-10 DIAGNOSIS — Z4781 Encounter for orthopedic aftercare following surgical amputation: Secondary | ICD-10-CM | POA: Diagnosis not present

## 2016-12-10 DIAGNOSIS — I129 Hypertensive chronic kidney disease with stage 1 through stage 4 chronic kidney disease, or unspecified chronic kidney disease: Secondary | ICD-10-CM | POA: Diagnosis not present

## 2016-12-10 DIAGNOSIS — E1151 Type 2 diabetes mellitus with diabetic peripheral angiopathy without gangrene: Secondary | ICD-10-CM | POA: Diagnosis not present

## 2016-12-10 DIAGNOSIS — Z794 Long term (current) use of insulin: Secondary | ICD-10-CM | POA: Diagnosis not present

## 2016-12-10 DIAGNOSIS — J45909 Unspecified asthma, uncomplicated: Secondary | ICD-10-CM | POA: Diagnosis not present

## 2016-12-10 DIAGNOSIS — Z4801 Encounter for change or removal of surgical wound dressing: Secondary | ICD-10-CM | POA: Diagnosis not present

## 2016-12-10 DIAGNOSIS — E1140 Type 2 diabetes mellitus with diabetic neuropathy, unspecified: Secondary | ICD-10-CM | POA: Diagnosis not present

## 2016-12-10 DIAGNOSIS — N183 Chronic kidney disease, stage 3 (moderate): Secondary | ICD-10-CM | POA: Diagnosis not present

## 2016-12-10 DIAGNOSIS — D631 Anemia in chronic kidney disease: Secondary | ICD-10-CM | POA: Diagnosis not present

## 2016-12-10 DIAGNOSIS — E1121 Type 2 diabetes mellitus with diabetic nephropathy: Secondary | ICD-10-CM | POA: Diagnosis not present

## 2016-12-10 DIAGNOSIS — Z89421 Acquired absence of other right toe(s): Secondary | ICD-10-CM | POA: Diagnosis not present

## 2016-12-10 DIAGNOSIS — E1122 Type 2 diabetes mellitus with diabetic chronic kidney disease: Secondary | ICD-10-CM | POA: Diagnosis not present

## 2016-12-10 DIAGNOSIS — G4733 Obstructive sleep apnea (adult) (pediatric): Secondary | ICD-10-CM | POA: Diagnosis not present

## 2016-12-11 ENCOUNTER — Other Ambulatory Visit: Payer: Self-pay | Admitting: *Deleted

## 2016-12-11 ENCOUNTER — Encounter: Payer: Self-pay | Admitting: *Deleted

## 2016-12-11 DIAGNOSIS — E1140 Type 2 diabetes mellitus with diabetic neuropathy, unspecified: Secondary | ICD-10-CM | POA: Diagnosis not present

## 2016-12-11 DIAGNOSIS — N183 Chronic kidney disease, stage 3 (moderate): Secondary | ICD-10-CM | POA: Diagnosis not present

## 2016-12-11 DIAGNOSIS — Z89421 Acquired absence of other right toe(s): Secondary | ICD-10-CM | POA: Diagnosis not present

## 2016-12-11 DIAGNOSIS — Z4801 Encounter for change or removal of surgical wound dressing: Secondary | ICD-10-CM | POA: Diagnosis not present

## 2016-12-11 DIAGNOSIS — J45909 Unspecified asthma, uncomplicated: Secondary | ICD-10-CM | POA: Diagnosis not present

## 2016-12-11 DIAGNOSIS — E1151 Type 2 diabetes mellitus with diabetic peripheral angiopathy without gangrene: Secondary | ICD-10-CM | POA: Diagnosis not present

## 2016-12-11 DIAGNOSIS — E1121 Type 2 diabetes mellitus with diabetic nephropathy: Secondary | ICD-10-CM | POA: Diagnosis not present

## 2016-12-11 DIAGNOSIS — Z794 Long term (current) use of insulin: Secondary | ICD-10-CM | POA: Diagnosis not present

## 2016-12-11 DIAGNOSIS — G4733 Obstructive sleep apnea (adult) (pediatric): Secondary | ICD-10-CM | POA: Diagnosis not present

## 2016-12-11 DIAGNOSIS — I129 Hypertensive chronic kidney disease with stage 1 through stage 4 chronic kidney disease, or unspecified chronic kidney disease: Secondary | ICD-10-CM | POA: Diagnosis not present

## 2016-12-11 DIAGNOSIS — D631 Anemia in chronic kidney disease: Secondary | ICD-10-CM | POA: Diagnosis not present

## 2016-12-11 DIAGNOSIS — Z4781 Encounter for orthopedic aftercare following surgical amputation: Secondary | ICD-10-CM | POA: Diagnosis not present

## 2016-12-11 DIAGNOSIS — E1122 Type 2 diabetes mellitus with diabetic chronic kidney disease: Secondary | ICD-10-CM | POA: Diagnosis not present

## 2016-12-11 DIAGNOSIS — H548 Legal blindness, as defined in USA: Secondary | ICD-10-CM | POA: Diagnosis not present

## 2016-12-11 NOTE — Patient Outreach (Signed)
[facility]) Care Management  12/11/2016  Alex Ryan 1970/11/28 [id number]   Initially call was for ongoing transition of care however pt reports he has established a new primary care provided with [facility] Alex [facility], [place] and completed the initial office visit.   Pt states he is doing well with low CBGs with plan to remove his staples soon and apply the compression stocking to his stump in perpetration for a fitting for a new protheses.   Based upon pt's recent change to a new provider pt is no longer eligible for [facility] services. Pt verbalized an understanding and aware that he needs to continue managing his care related to his diabetes. Note due to an earlier discharge goals were not met at this time but reviewed.  Will close case and notify his provider that pt will be discharged. No other major issues to consult on at this time. Case will be closed and pt aware to stress any concerning to his provider concerning his recent amputation.  Alex Mina, RN Care Management Coordinator [place] Office [phone]

## 2016-12-14 DIAGNOSIS — N183 Chronic kidney disease, stage 3 (moderate): Secondary | ICD-10-CM | POA: Diagnosis not present

## 2016-12-14 DIAGNOSIS — J45909 Unspecified asthma, uncomplicated: Secondary | ICD-10-CM | POA: Diagnosis not present

## 2016-12-14 DIAGNOSIS — Z794 Long term (current) use of insulin: Secondary | ICD-10-CM | POA: Diagnosis not present

## 2016-12-14 DIAGNOSIS — E1122 Type 2 diabetes mellitus with diabetic chronic kidney disease: Secondary | ICD-10-CM | POA: Diagnosis not present

## 2016-12-14 DIAGNOSIS — E1151 Type 2 diabetes mellitus with diabetic peripheral angiopathy without gangrene: Secondary | ICD-10-CM | POA: Diagnosis not present

## 2016-12-14 DIAGNOSIS — H548 Legal blindness, as defined in USA: Secondary | ICD-10-CM | POA: Diagnosis not present

## 2016-12-14 DIAGNOSIS — Z4801 Encounter for change or removal of surgical wound dressing: Secondary | ICD-10-CM | POA: Diagnosis not present

## 2016-12-14 DIAGNOSIS — D631 Anemia in chronic kidney disease: Secondary | ICD-10-CM | POA: Diagnosis not present

## 2016-12-14 DIAGNOSIS — Z89421 Acquired absence of other right toe(s): Secondary | ICD-10-CM | POA: Diagnosis not present

## 2016-12-14 DIAGNOSIS — E1140 Type 2 diabetes mellitus with diabetic neuropathy, unspecified: Secondary | ICD-10-CM | POA: Diagnosis not present

## 2016-12-14 DIAGNOSIS — I129 Hypertensive chronic kidney disease with stage 1 through stage 4 chronic kidney disease, or unspecified chronic kidney disease: Secondary | ICD-10-CM | POA: Diagnosis not present

## 2016-12-14 DIAGNOSIS — E1121 Type 2 diabetes mellitus with diabetic nephropathy: Secondary | ICD-10-CM | POA: Diagnosis not present

## 2016-12-14 DIAGNOSIS — G4733 Obstructive sleep apnea (adult) (pediatric): Secondary | ICD-10-CM | POA: Diagnosis not present

## 2016-12-14 DIAGNOSIS — Z4781 Encounter for orthopedic aftercare following surgical amputation: Secondary | ICD-10-CM | POA: Diagnosis not present

## 2016-12-15 IMAGING — CR DG FEMUR 2+V*R*
2 series · 2 of 2 positions shown · non-contrast
Comparison: None.

CLINICAL DATA: Fall last week with right leg pain. Initial
encounter.

EXAM:
RIGHT FEMUR 2 VIEWS

[t femur distal lat right]
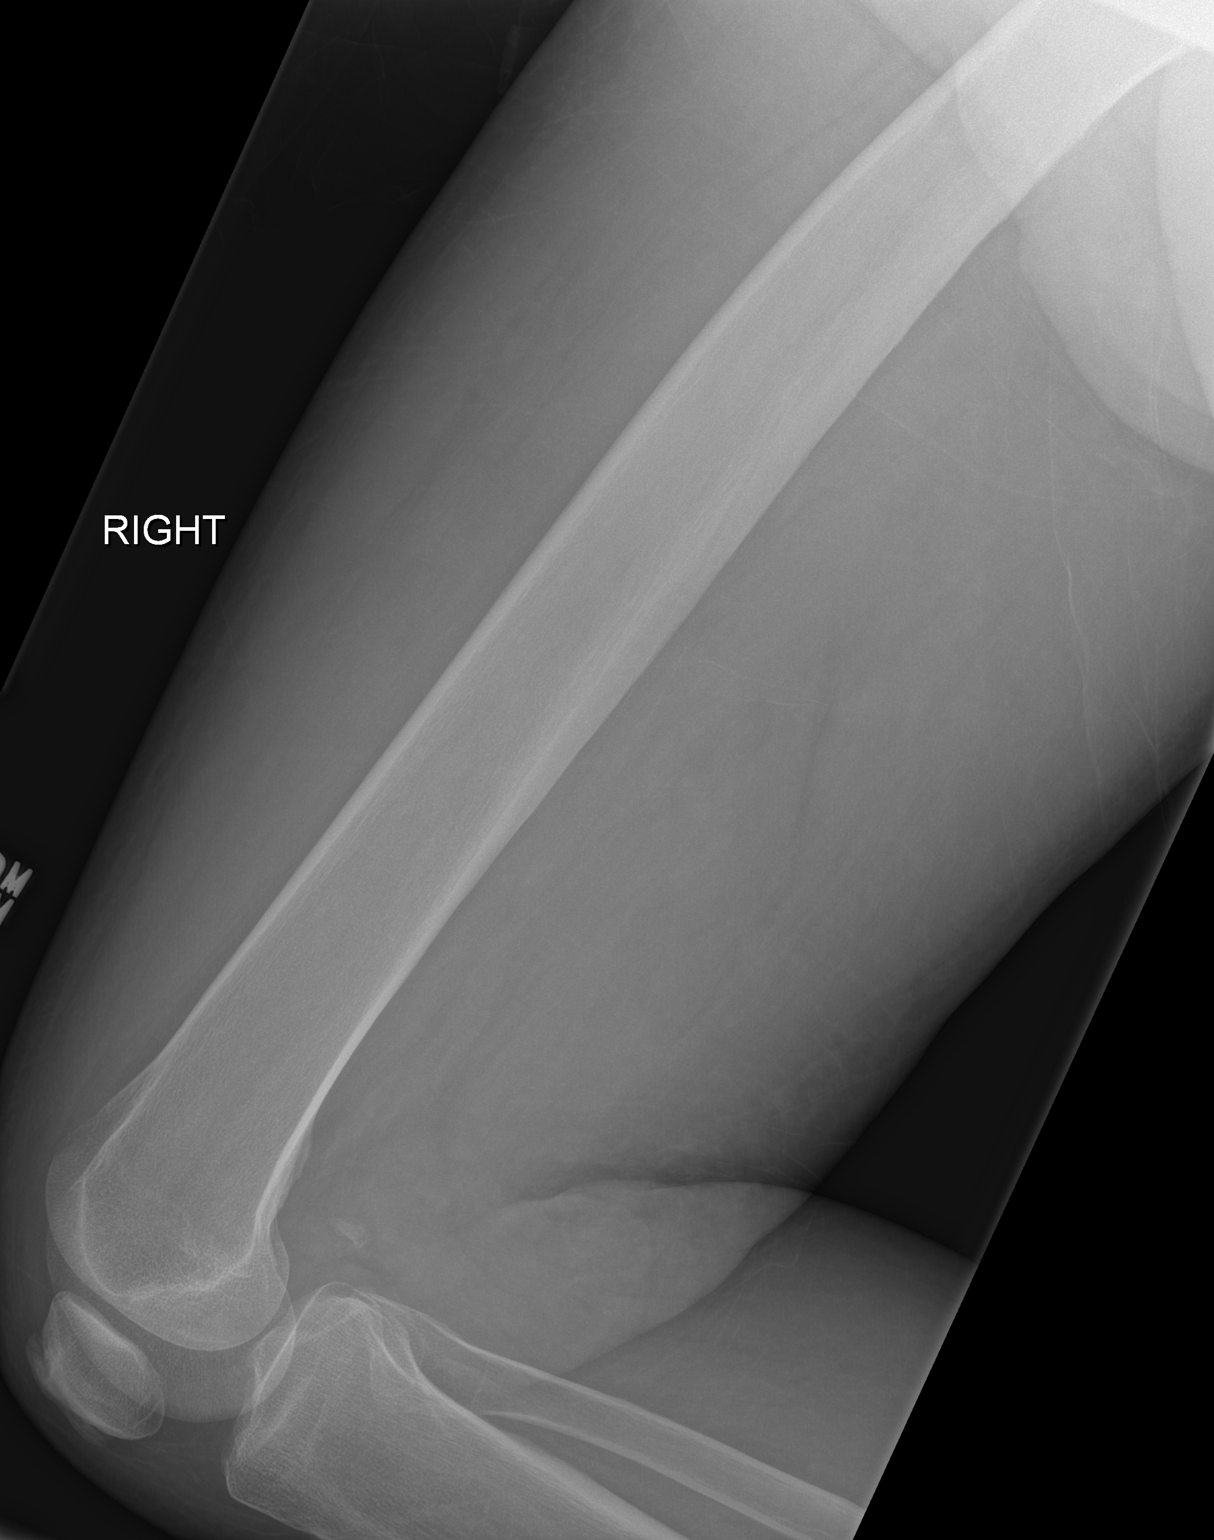

[t femur distal ap right]
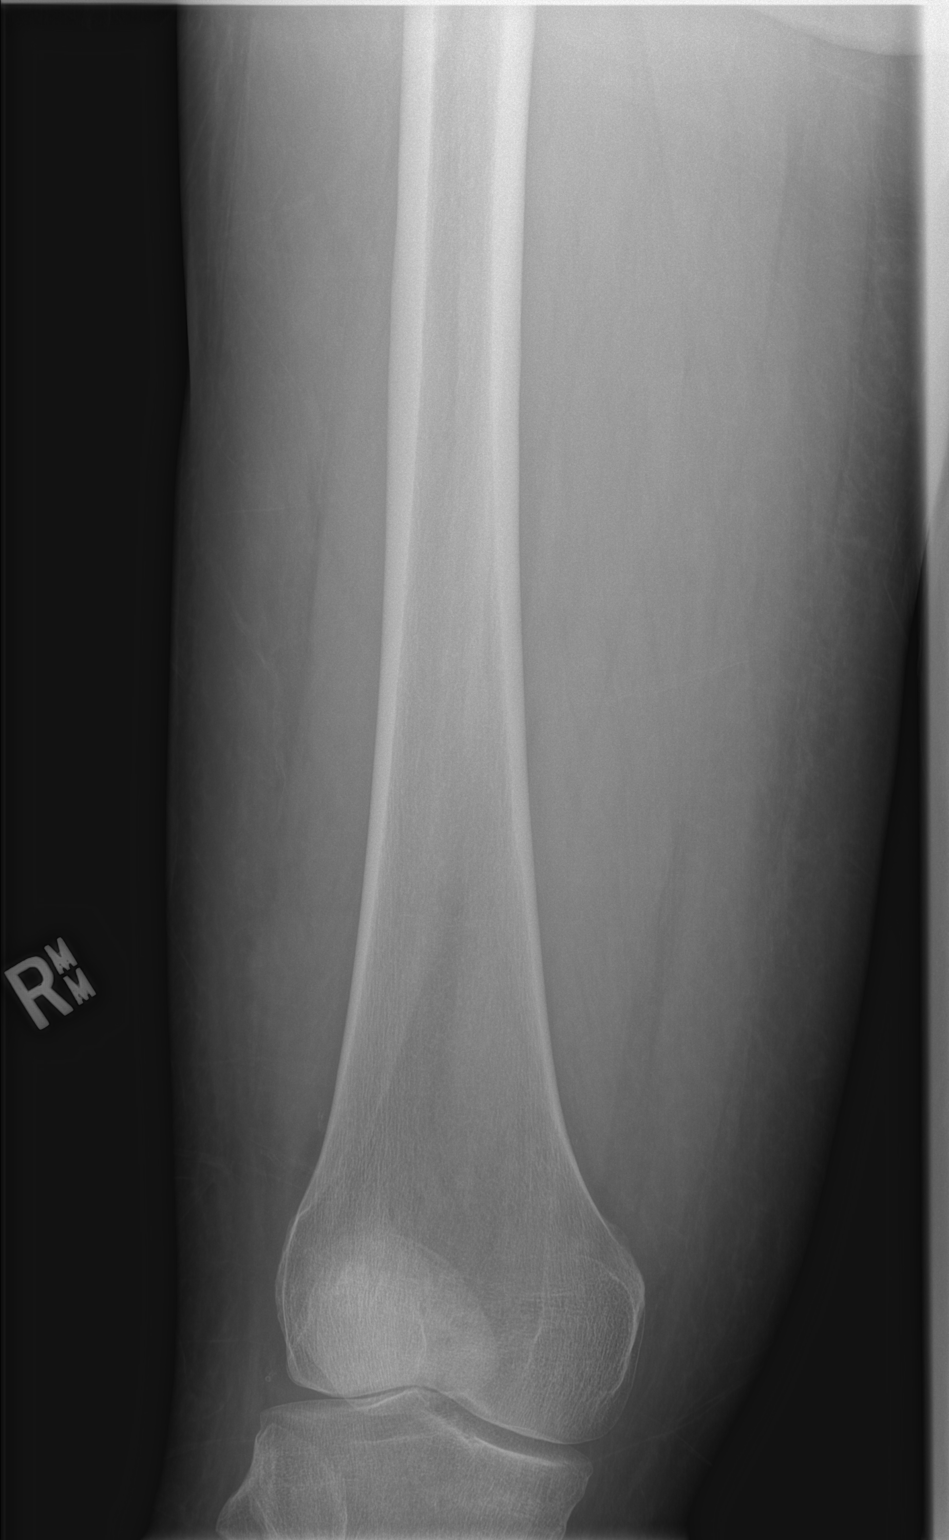

[2 of 2 positions shown; findings below may reference images not displayed]

FINDINGS: The proximal femur is visualized on contemporaneous hip imaging.

There is no evidence of fracture, dislocation, or focal bone lesion.

No soft tissue gas or opaque foreign body.
IMPRESSION: No acute finding.

## 2016-12-15 IMAGING — CR DG HIP (WITH OR WITHOUT PELVIS) 2-3V*R*
3 series · 3 of 3 positions shown · non-contrast
Comparison: None.

CLINICAL DATA: Fall with right leg pain.  Initial encounter.

EXAM:
DG HIP (WITH OR WITHOUT PELVIS) 2-3V RIGHT

[t pelvis ap]
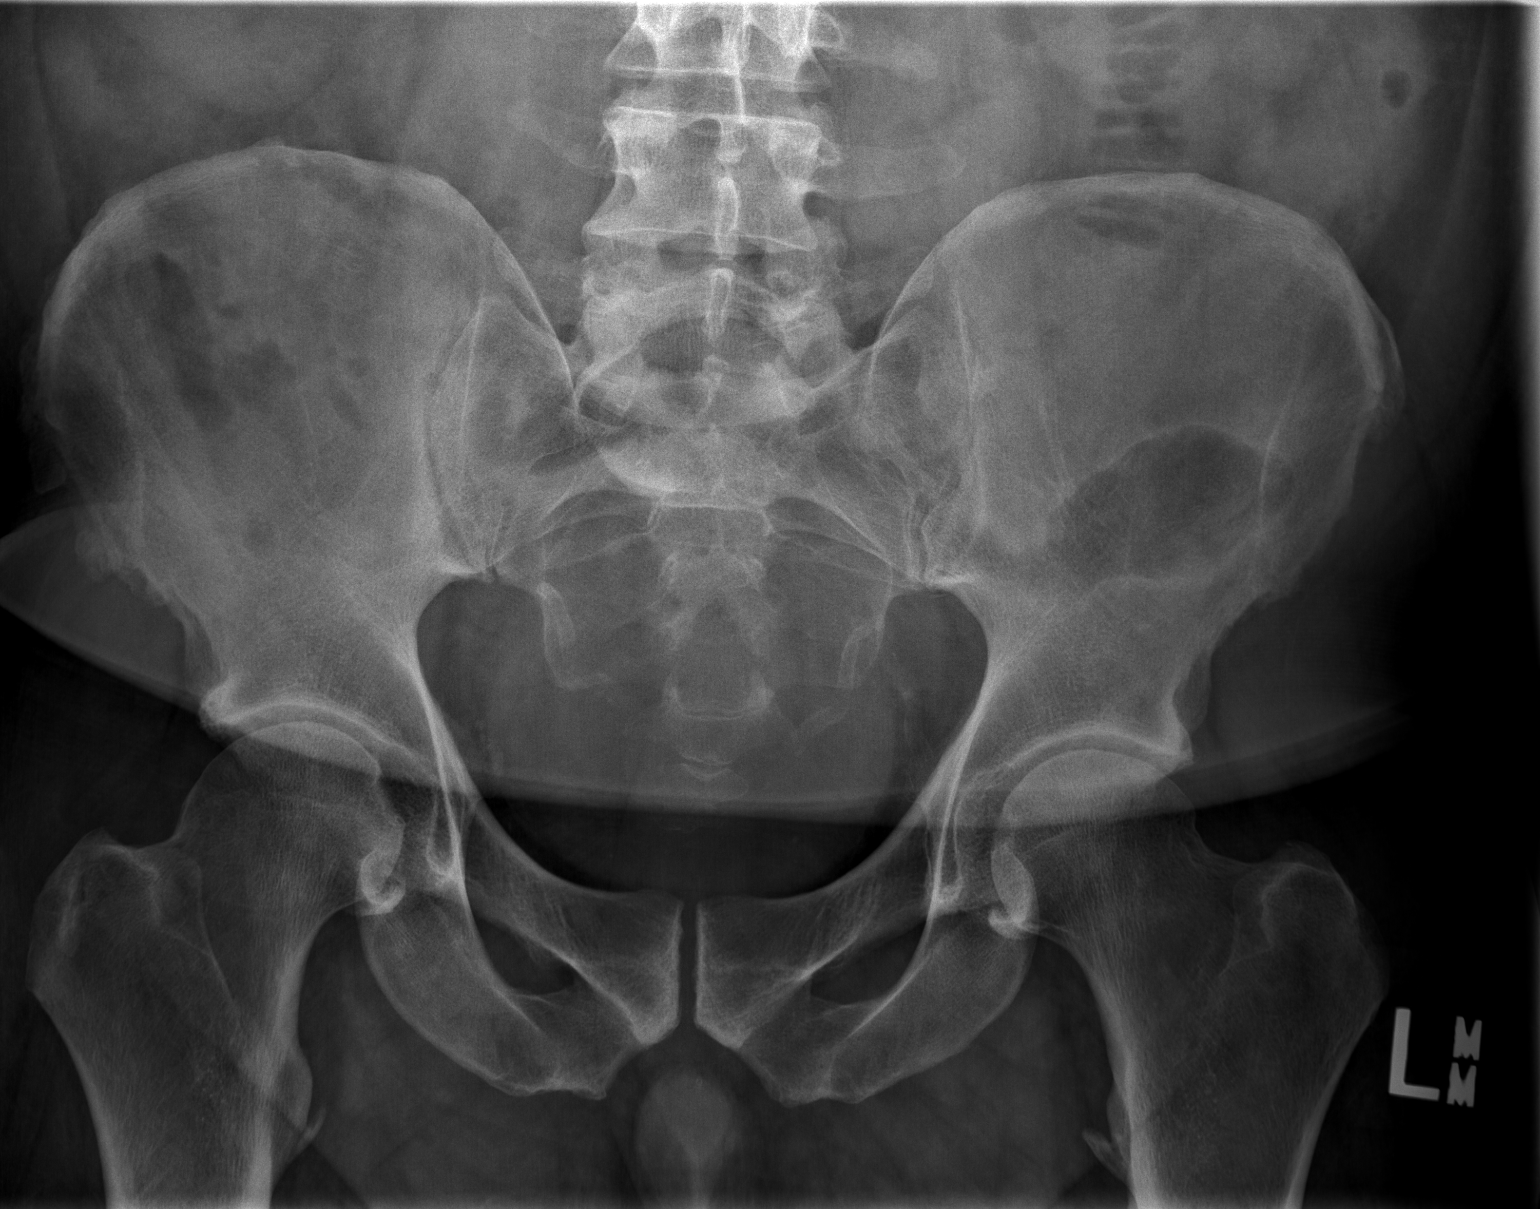

[t hip ap right]
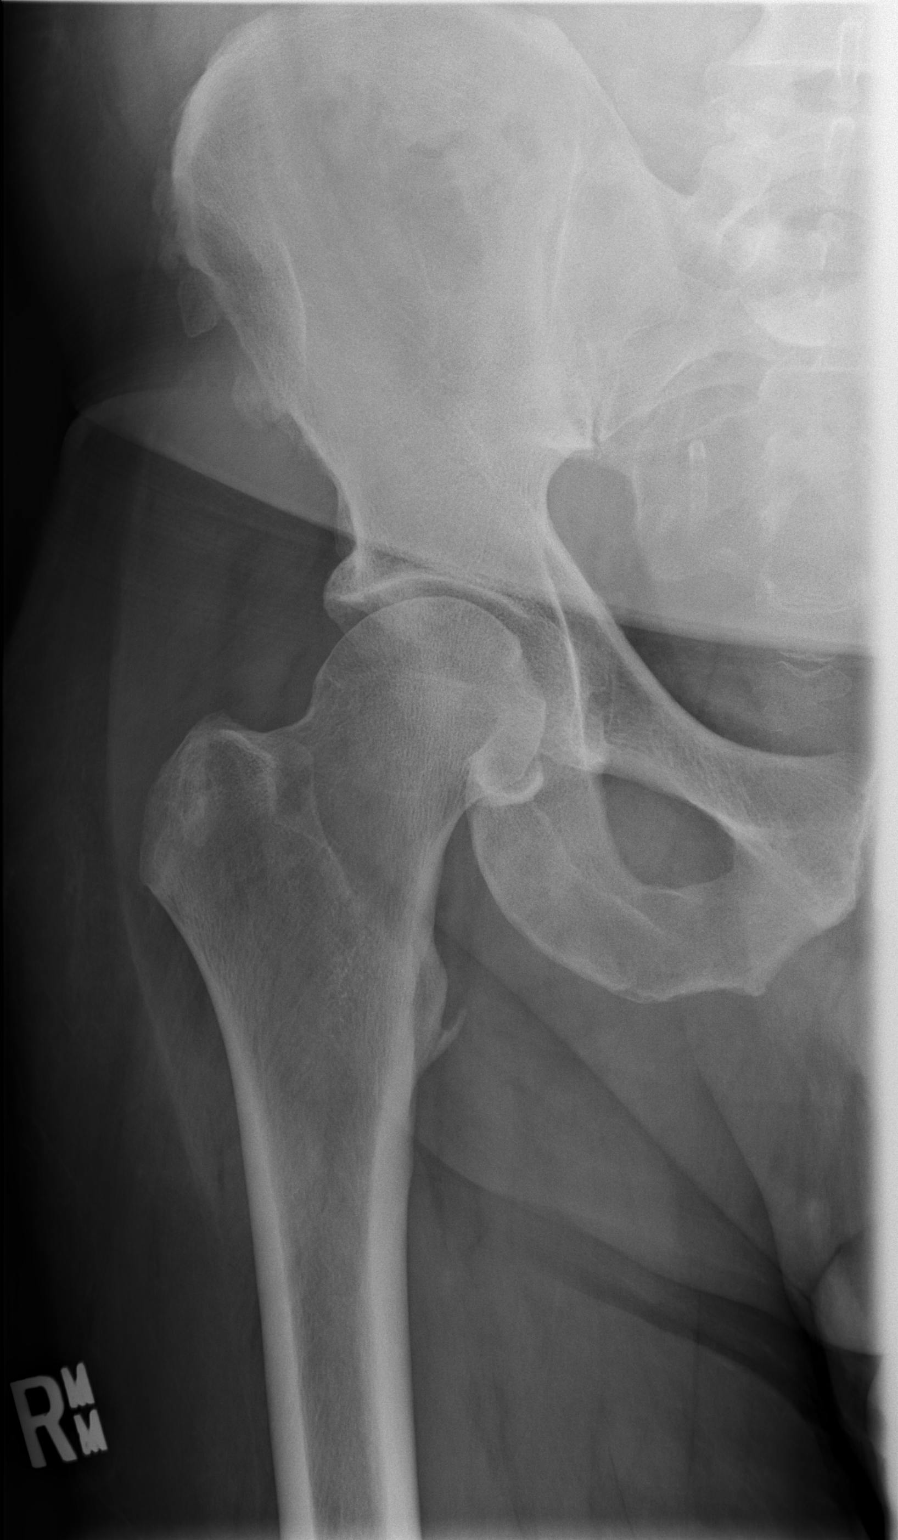

[t hip frog leg right]
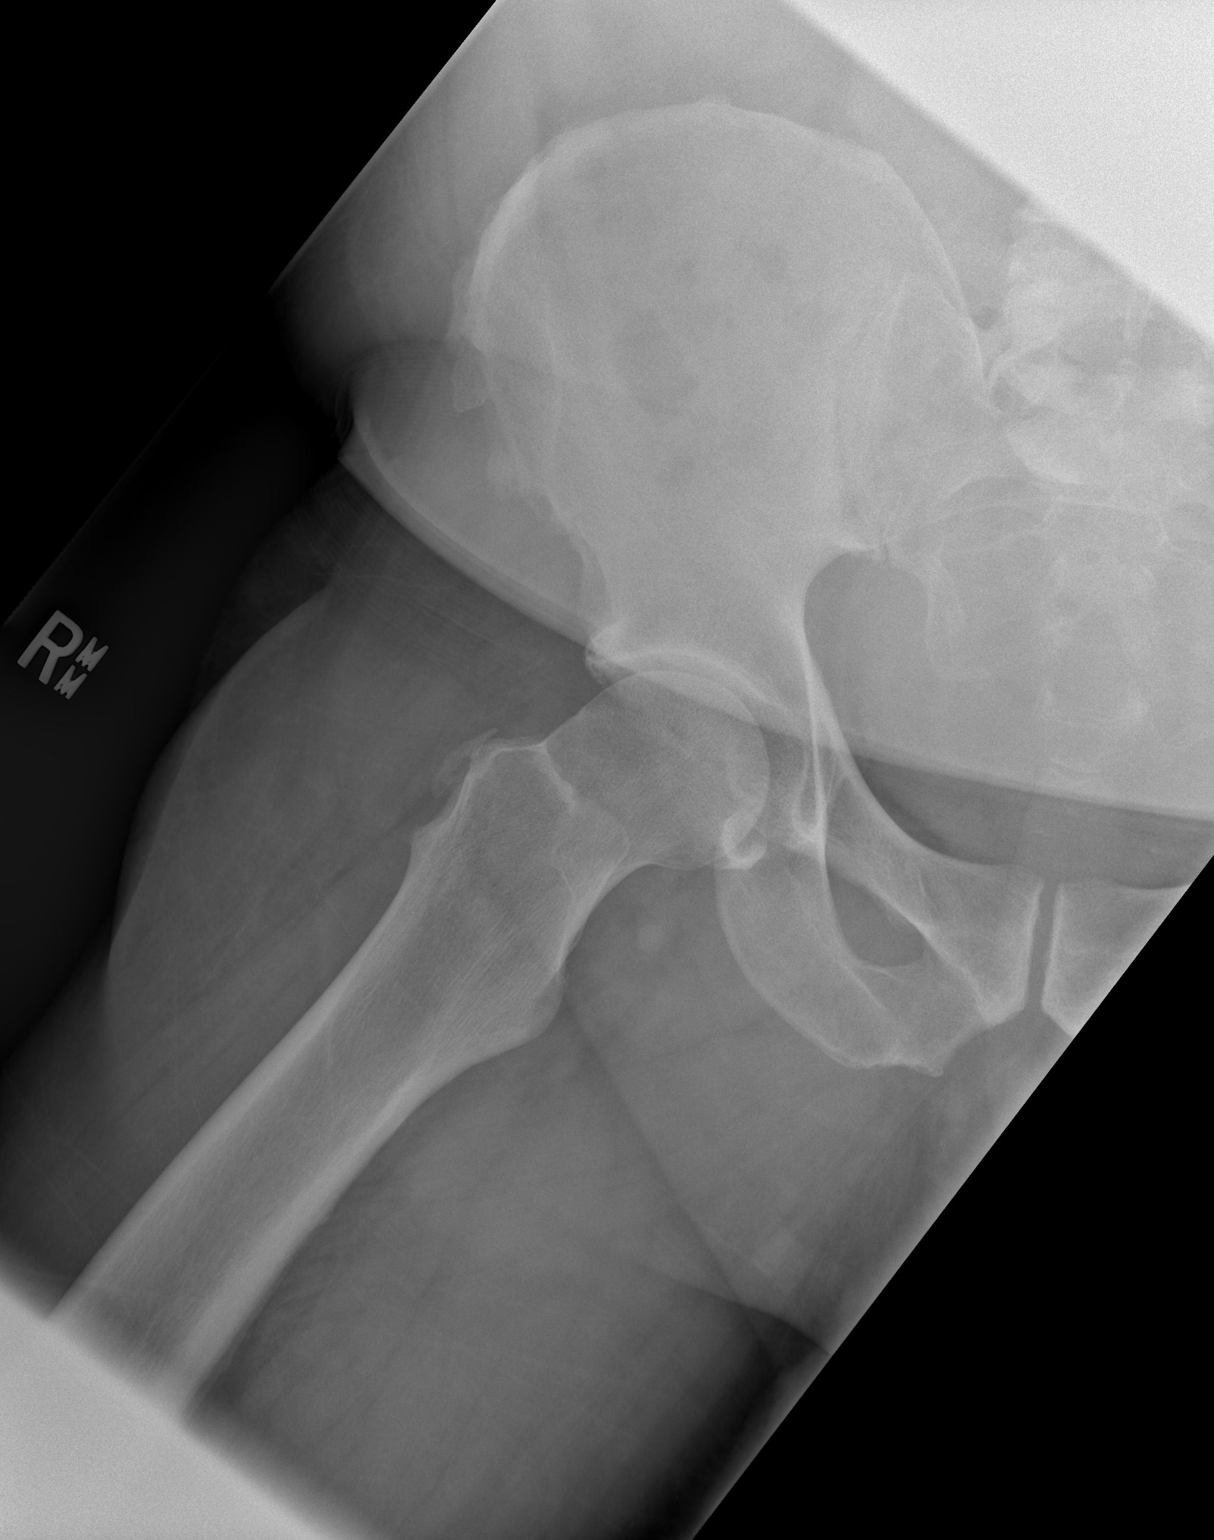

[3 of 3 positions shown; findings below may reference images not displayed]

FINDINGS: There is no evidence of hip fracture or dislocation. There is no
evidence of arthropathy or other focal bone abnormality.

Small incidental trochanteric enthesophytes.

Tubular calcification in the right hemipelvis, likely vas deferens
calcification from patient's diabetes.
IMPRESSION: Negative.

## 2016-12-15 IMAGING — CT CT FEMUR *R* W/ CM
3 of 5 series · 8 of 33 positions shown, 9 images · IV contrast (agent unspecified)
Comparison: None.

CONTRAST:  80mL OMNIPAQUE IOHEXOL 300 MG/ML  SOLN

ADDENDUM:
Note that there is a bulbous appearance to the Achilles tendon
approximately 5 cm above the calcaneal insertion with 2 small
associated foci of calcification. These findings may be due to
chronic Achilles tendinitis/partial tear.
CLINICAL DATA: Evaluate for cellulitis. Two days of right lower
extremity pain and edema as well as erythema and fever. Tender to
touch without crepitus. Evaluate for necrotizing fasciitis. Patient
started on Zosyn and vancomycin.

EXAM:
CT OF THE LOWER RIGHT EXTREMITY WITH CONTRAST
TECHNIQUE: Multidetector CT imaging of the right lower extremity from hip to
ankle was performed according to the standard protocol following
intravenous contrast administration.

[Series 2: femur/tib fib · axial · 0.59mm/px · z∈[-924,-456]mm · 2 of 338 slices shown, 3 images]
[im 104/338  soft-tissue]
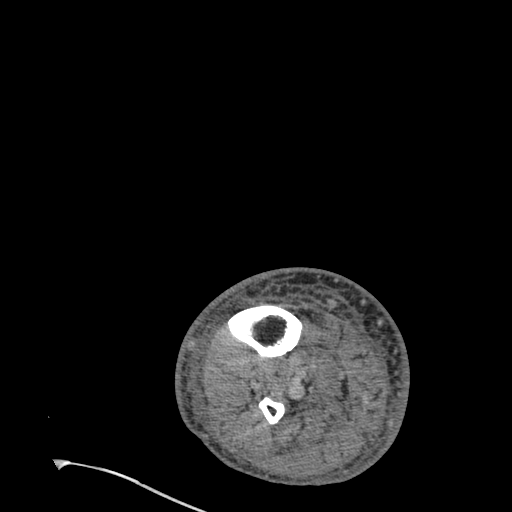
[im 104/338  bone]
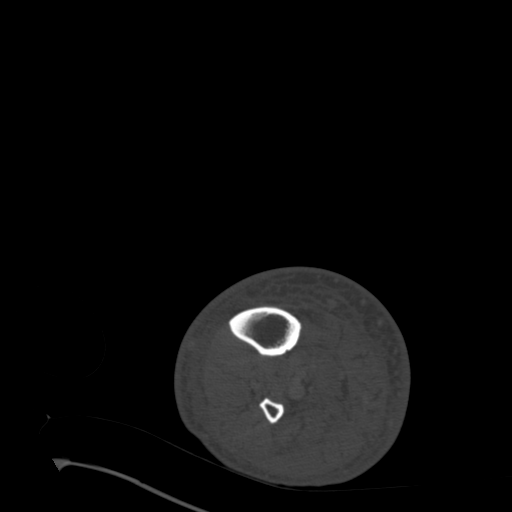
[im 260/338  bone]
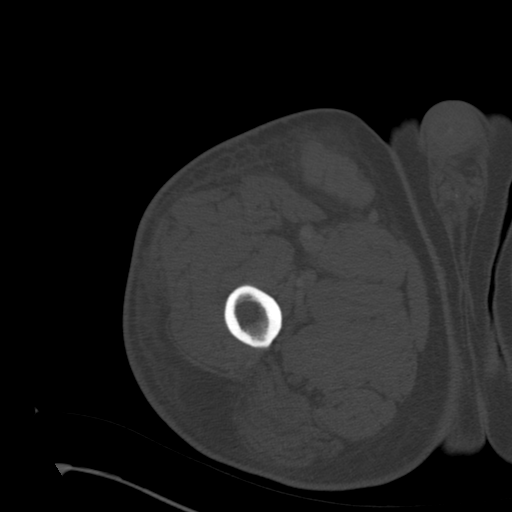

[Series 5: coronal images · coronal · 0.74mm/px · 1 of 127 slices shown]
[im 64/127  bone]
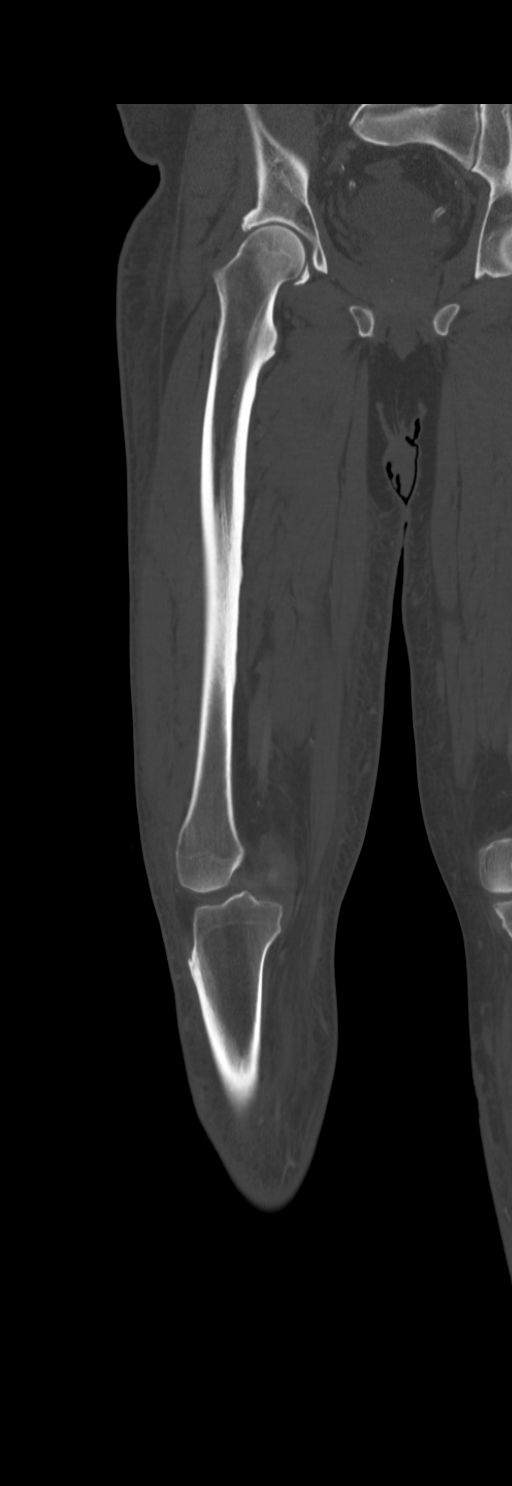

[Series 6: sagittal images · sagittal · 0.74mm/px · 5 of 98 slices shown]
[im 17/98  bone]
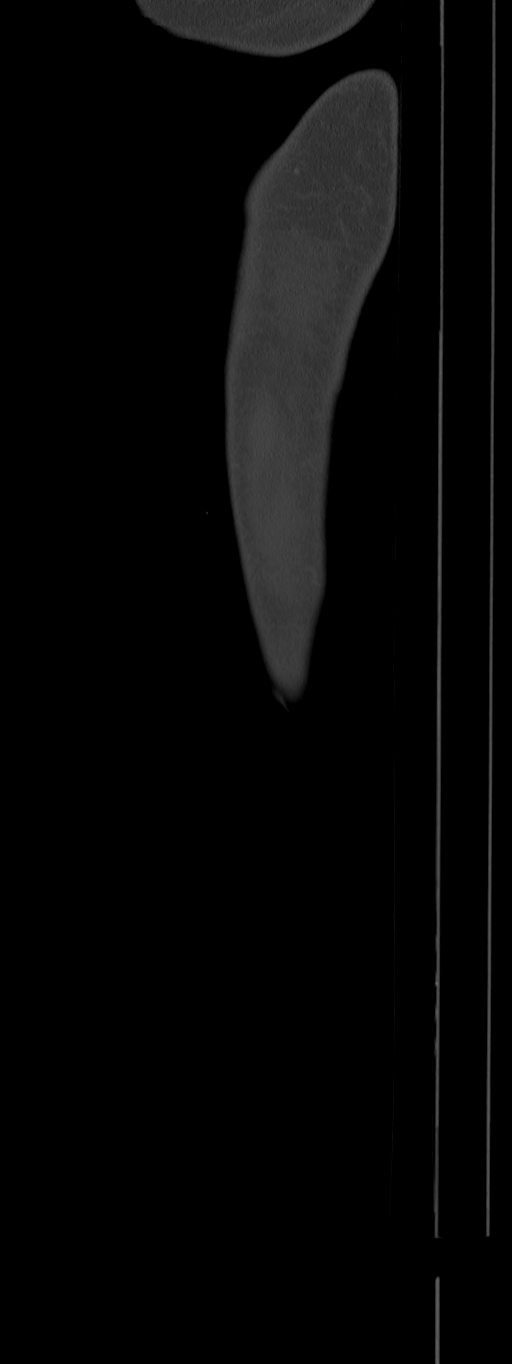
[im 33/98  bone]
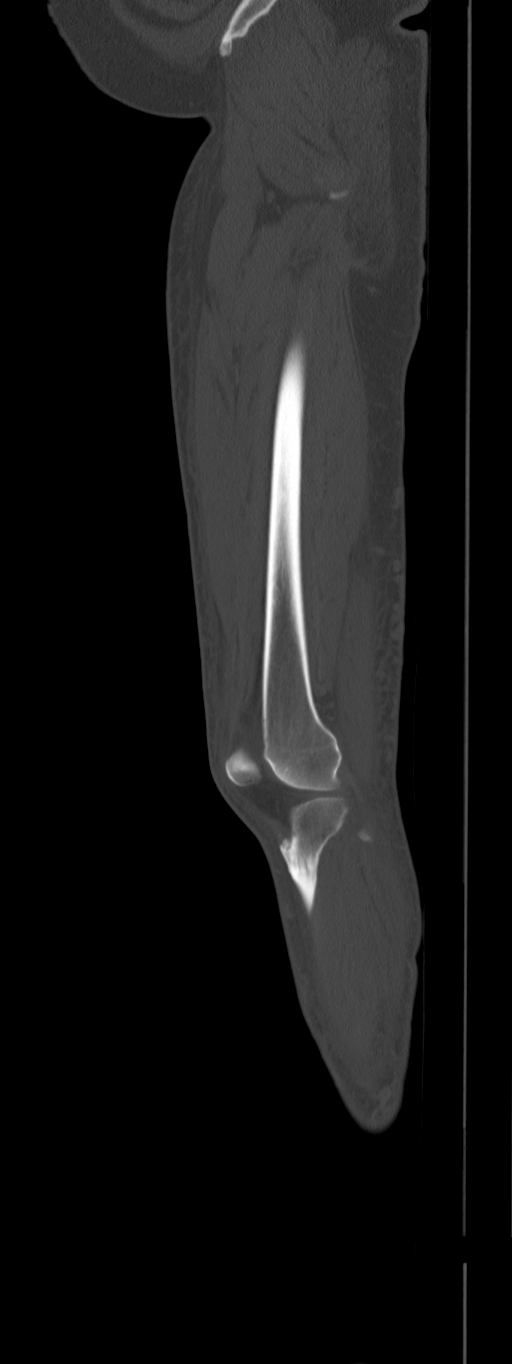
[im 49/98  bone]
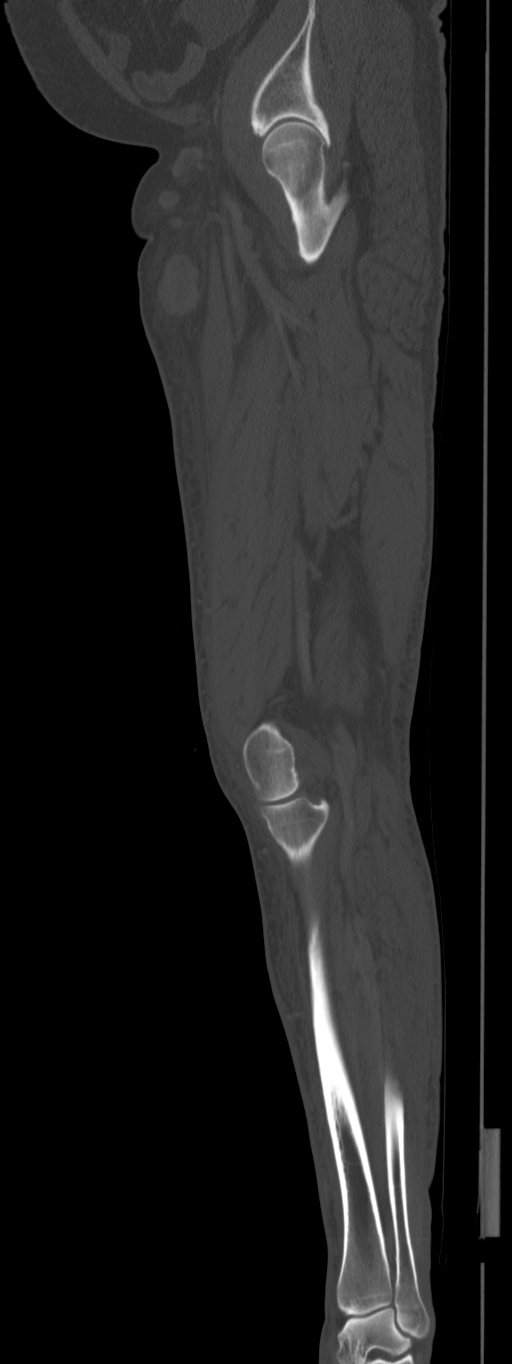
[im 65/98  bone]
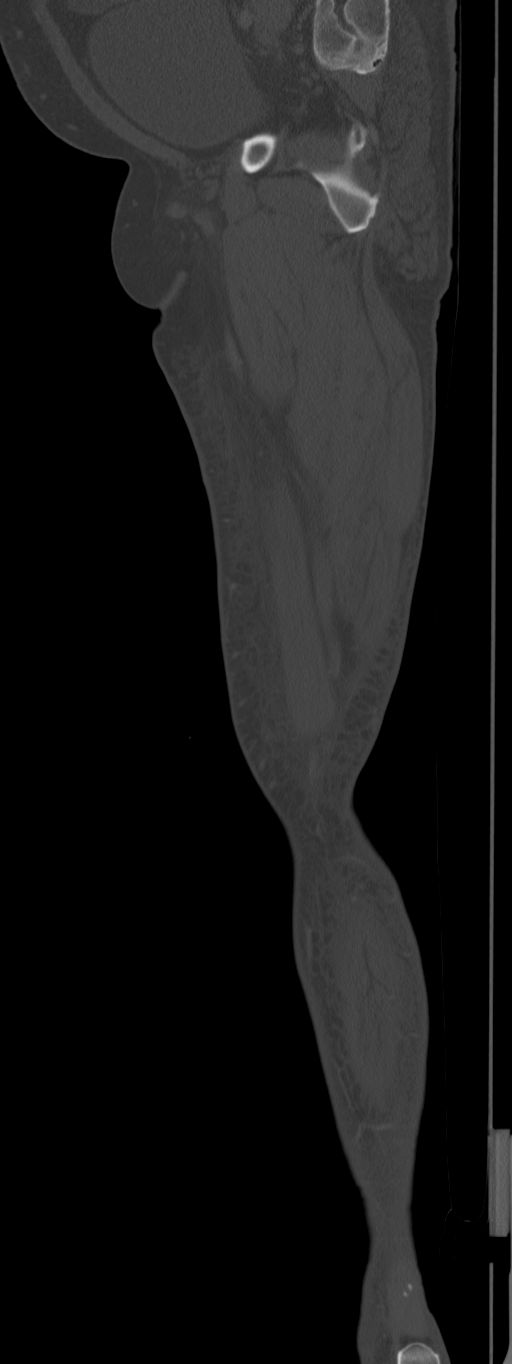
[im 81/98  bone]
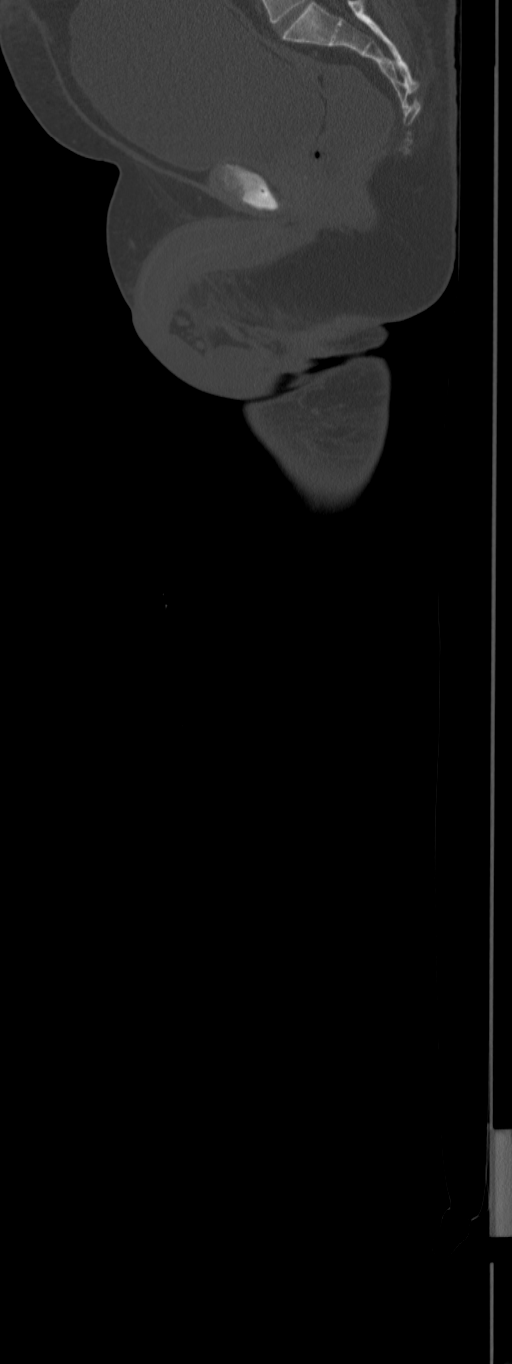

[8 of 33 positions shown; findings below may reference images not displayed]

FINDINGS: The visualized pelvis demonstrates moderate bladder distention.
There mild degenerate changes of the spine and right hip.

Few small lymph nodes along the right external iliac chain. There is
moderate right inguinal adenopathy with the largest node measuring 3
x 5.5 cm. There is mild subcutaneous edema throughout the right
lower extremity. There is no focal subcutaneous fluid collection.
There are is no air within the soft tissues. Underlying muscle
compartments within the upper and lower leg are within normal. No
joint effusion within the right hip or knee. Underlying bony
structures are within normal.

There is minimal calcified plaque throughout the right femoral
artery. There is definite evidence of venous thrombosis.
IMPRESSION: Mild to moderate subcutaneous edema throughout the right lower
extremity. No evidence of abscess within the subcutaneous tissues or
muscle compartment. No air within the soft tissues to suggest
necrotizing infection. Moderate right inguinal adenopathy. Findings
likely due to cellulitis.

Mild degenerative change of the right hip.

## 2016-12-17 ENCOUNTER — Ambulatory Visit: Payer: Medicare Other | Admitting: *Deleted

## 2016-12-24 ENCOUNTER — Ambulatory Visit: Payer: Medicare Other | Admitting: *Deleted

## 2017-01-01 ENCOUNTER — Ambulatory Visit: Payer: Medicare Other | Admitting: *Deleted

## 2017-01-06 ENCOUNTER — Ambulatory Visit: Payer: Medicare Other | Admitting: *Deleted

## 2017-01-13 ENCOUNTER — Ambulatory Visit: Payer: Medicare Other | Admitting: Endocrinology

## 2017-02-16 IMAGING — DX DG CHEST 1V PORT
1 series · 1 of 1 positions shown · non-contrast
Comparison: 07/06/2015 and earlier.

CLINICAL DATA: Post right thoracentesis.

EXAM:
PORTABLE CHEST 1 VIEW

[chest ap]
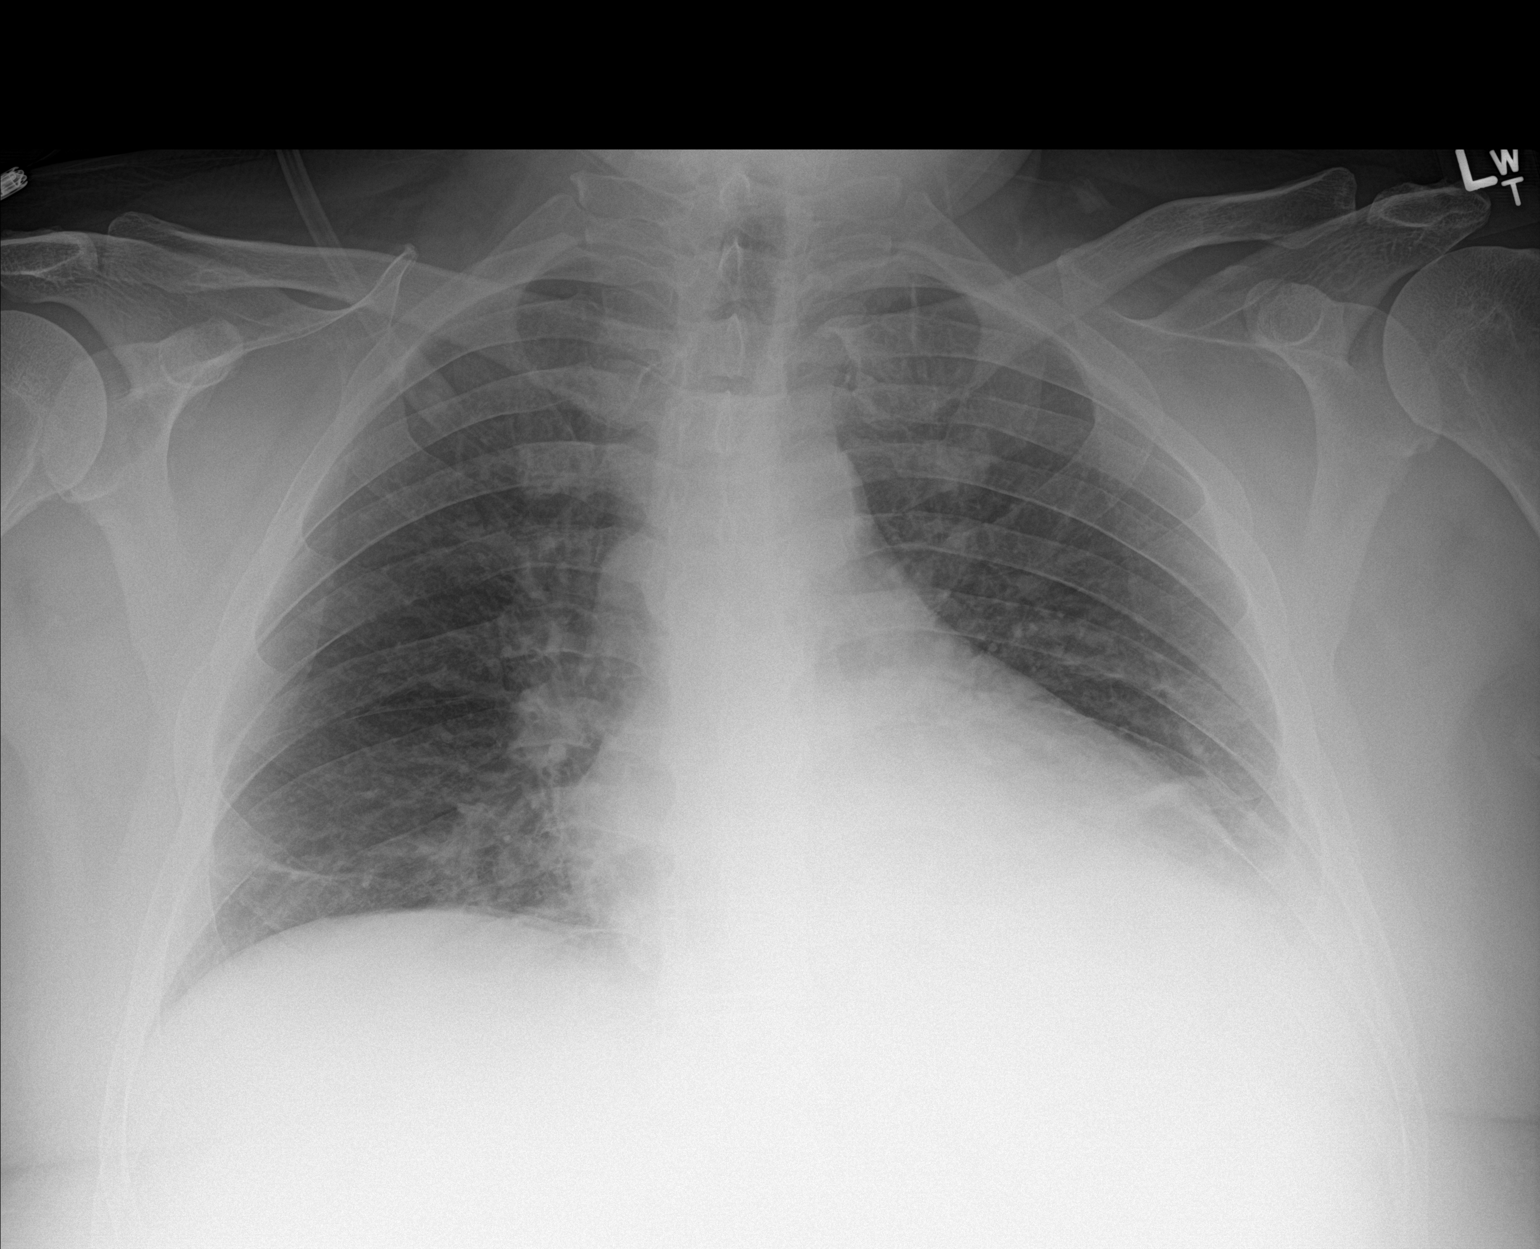

[1 of 1 positions shown; findings below may reference images not displayed]

FINDINGS: No evidence of pneumothorax after right thoracentesis. Near complete
resolution of right pleural effusion. Improved aeration in the right
lung base with only minimal linear atelectasis persisting. Stable
moderate-sized left pleural effusion and associated dense
consolidation in the left lower lobe. Interval resolution of
interstitial pulmonary edema.
IMPRESSION: 1. No pneumothorax after right thoracentesis.
2. Near complete resolution of the right pleural effusion with
improved aeration in the right lower lobe. Only minimal linear
atelectasis persists.
3. Stable moderate-sized left pleural effusion and associated dense
passive atelectasis and/or pneumonia in the left lower lobe.
4. Interval resolution of interstitial pulmonary edema.

## 2017-03-16 ENCOUNTER — Telehealth: Payer: Self-pay | Admitting: Endocrinology

## 2017-03-16 NOTE — Telephone Encounter (Signed)
[facility] is calling on the status of Diabetic supplies  Please advise Water engineer) phone # [phone]

## 2017-03-17 NOTE — Telephone Encounter (Signed)
Called patient and left a voice message to let him know about this company trying to order his testing supplies and also he has not been here in over a year so more than likely he is going to a different practice.

## 2017-03-23 ENCOUNTER — Encounter: Payer: Self-pay | Admitting: Internal Medicine

## 2017-03-23 ENCOUNTER — Ambulatory Visit (INDEPENDENT_AMBULATORY_CARE_PROVIDER_SITE_OTHER): Payer: Medicare Other | Admitting: Internal Medicine

## 2017-03-23 VITALS — BP 164/82 | HR 70 | Ht 70.0 in | Wt 251.0 lb

## 2017-03-23 DIAGNOSIS — I10 Essential (primary) hypertension: Secondary | ICD-10-CM

## 2017-03-23 DIAGNOSIS — E1065 Type 1 diabetes mellitus with hyperglycemia: Secondary | ICD-10-CM | POA: Diagnosis not present

## 2017-03-23 DIAGNOSIS — N049 Nephrotic syndrome with unspecified morphologic changes: Secondary | ICD-10-CM | POA: Diagnosis not present

## 2017-03-23 DIAGNOSIS — E1029 Type 1 diabetes mellitus with other diabetic kidney complication: Secondary | ICD-10-CM

## 2017-03-23 DIAGNOSIS — R809 Proteinuria, unspecified: Secondary | ICD-10-CM | POA: Diagnosis not present

## 2017-03-23 DIAGNOSIS — IMO0002 Reserved for concepts with insufficient information to code with codable children: Secondary | ICD-10-CM

## 2017-03-23 NOTE — Progress Notes (Signed)
OFFICE CONSULT NOTE  Chief Complaint:  Markedly improved swelling  Primary Care Physician: Daleen Snook, NP  HPI:  Alex Ryan is a 46 y.o. male who is being seen today for the evaluation of anasarca and uncontrolled hypertension at the request of Laurey Morale, MD. Although he was previously seen once by Dr. Stanford Breed in 2013, he is considered a new patient to our practice since is not been seen in the last 3 years. Alex Ryan was referred to me in consultation for evaluation of difficult to control edema. This is been worsening over the past several weeks to months. This necessitated increase in his diuretics. He had a recent hospitalization for sepsis picture (06/2015) with significant volume overload and required massive diuresis. Recently he's had difficult to control hypertension and significant upper and lower extremity edema. He saw his primary care provider to increase his diuretics. In addition he's had difficult to control hypertension requiring an increase in his medications. Amlodipine 5 mg daily, clonidine 0.2 mg 3 times a day, losartan 100 mg daily and Toprol-XL 100 mg daily. Blood pressure today is improved at 152/80. He says since his recent medication adjustments his blood pressure is better. He reports some improvement in his swelling with the Lasix but still has significant anasarca, mild scrotal edema and upper arm swelling. He says he can barely straighten his legs due to significant "woody stasis edema". He also has some blisters developing on the legs but are not weeping. He did have an echocardiogram when he was hospitalized showing an EF of 55-60% in February 2017, apparently right ventricular function was considered normal. Recent lab work was not necessarily remarkable except it is noted that he has a low albumin.  09/17/2016  Alex Ryan returns today for follow-up. He's had marked diuresis with the addition of metolazone to his Lasix. Over the past 3 weeks his weight  is decreased from 282 pounds 235 pounds. We followed his creatinine closely which initially started to rise including the development of hyperkalemia. We stopped his potassium supplements and his numbers have improved. Creatinine is also come down to 1.34 with potassium of 5.1. His glucose on recent lab work was greater than 760 however he reports fingerstick glucose checks or not that high. He has a follow-up with his primary care provider for further adjustments in his insulin. We did perform an echocardiogram which demonstrated normal systolic and very mild diastolic dysfunction with normal right heart function and no clear explanation for his significant edema. As noted previously does have a low serum albumin and I reviewed lab work from 07/2015 which showed a urine microalbumin of 365 and urine creatinine of 96, which suggests a urine albumin to creatinine ratio of 3.8 or close to 4 g of protein when extrapolated to 24 hour period - this suggests likely nephrotic syndrome. He now reports he started to have some cramping in his hands.  03/23/2017  Alex Ryan returns today for follow-up.  He ultimately saw nephrology (Dr. Aundra Dubin) with [facility] health care.  He was diagnosed with diabetic nephropathy.  Since then he has managed to do a good job of controlling his weight.  Recently he has had some orthopedic issues after his amputation of the right lower extremity.  He has subsequently required steroids and is in the process of being fitted for prosthesis.  He denies any chest pain or worsening shortness of breath.  He reports a marked improvement in his hemoglobin A1c down to 6.1.  Weight  has gone back up however he says he thinks is related to his steroids.  PMHx:  Past Medical History:  Diagnosis Date  . Allergic rhinitis   . Anxiety   . Asthma   . Bladder infection    in June -Was on Cipro  . Cellulitis    legs - hx  . Chronic kidney disease   . CKD (chronic kidney disease), stage II   . Diabetes  mellitus    sees Dr. Jacelyn Pi   . H/O hiatal hernia    pt thinks so  . Headache(784.0)    had migraine- not in years  . Heart murmur    Followed by Dr Stanford Breed  . Hyperlipidemia   . Hypertension   . Neuropathy in diabetes ([place])   . OSA (obstructive sleep apnea)    CPAP- not wearing  . Recurrent upper respiratory infection (URI)    frequent bronchcitis  . Retinopathy    right eye  . Retinopathy of both eyes    sees Dr. Zadie Rhine   . Shingles 2010    Past Surgical History:  Procedure Laterality Date  . EYE SURGERY  2012   Cataract with lens  Right  . TONSILLECTOMY  1983  . VITRECTOMY  2013   per Dr. Zadie Rhine    FAMHx:  Family History  Problem Relation Age of Onset  . Diabetes Mother   . Transient ischemic attack Father   . Cancer Father   . Epilepsy Father   . Diabetes Maternal Uncle   . Diabetes Maternal Grandmother   . Heart disease Maternal Grandfather   . Epilepsy Child   . Asthma Child   . Anesthesia problems Neg Hx     SOCHx:   reports that he quit smoking about 7 years ago. He has a 8.70 pack-year smoking history. He has quit using smokeless tobacco. He reports that he does not drink alcohol or use drugs.  ALLERGIES:  Allergies  Allergen Reactions  . Codeine Hives and Nausea And Vomiting    Hyper  . Erythromycin Nausea And Vomiting  . Hibiclens [Chlorhexidine] Itching  . Keflex [Cephalexin] Rash  . Other Hives    Pre-op cleaning wipes:. itching  . Soap Itching    "dial soap"  . Vancomycin Swelling    Patient reports severe bilateral upper extremity swelling with vancomycin    ROS: Pertinent items noted in HPI and remainder of comprehensive ROS otherwise negative.  HOME MEDS: Current Outpatient Medications on File Prior to Visit  Medication Sig Dispense Refill  . albuterol (PROVENTIL HFA;VENTOLIN HFA) 108 (90 Base) MCG/ACT inhaler Inhale 2 puffs into the lungs every 4 (four) hours as needed for wheezing or shortness of breath. PRN:   Allergies/breathing 1 Inhaler 5  . Alcohol Swabs PADS Use up to 10 times per day for testing and insulin injections, diagnosis code is 250.00 300 each 3  . ALPRAZolam (XANAX) 0.25 MG tablet Take 1 tablet (0.25 mg total) by mouth 2 (two) times daily as needed for anxiety. 60 tablet 2  . Blood Glucose Monitoring Suppl (ONETOUCH VERIO IQ SYSTEM) w/Device KIT Use to check sugar 3 times daily 1 kit 0  . DiphenhydrAMINE HCl (BENADRYL PO) Take by mouth as needed.    . ferrous sulfate 325 (65 FE) MG tablet Take 325 mg by mouth daily with breakfast.    . FLUoxetine (PROZAC) 40 MG capsule Take 1 capsule (40 mg total) by mouth daily. 90 capsule 3  . furosemide (LASIX) 40 MG tablet Take  2 tabs in the morning and one tab in the evening (Patient taking differently: Take 40 mg by mouth daily. Take 2 tabs in the morning and one tab in the evening) 90 tablet 5  . glucose blood (ONETOUCH VERIO) test strip Use as instructed to check sugar 3 times daily 300 each 5  . insulin NPH Human (HUMULIN N) 100 UNIT/ML injection Inject 0.4 mLs (40 Units total) into the skin 2 (two) times daily before a meal. (Patient taking differently: Inject 42 Units into the skin 2 (two) times daily before a meal. ) 20 mL 2  . insulin regular (HUMULIN R) 100 units/mL injection 10 units in the morning, 10 at lunch, and 6 in the evening (Patient taking differently: 8 units in the morning, 8 at lunch, and 8 in the evening) 20 mL 2  . Insulin Syringe-Needle U-100 (INSULIN SYRINGE 1CC/31GX5/16") 31G X 5/16" 1 ML MISC 3 times daily 100 each 1  . losartan (COZAAR) 100 MG tablet Take 1 tablet (100 mg total) by mouth daily. 30 tablet 11  . metoprolol succinate (TOPROL-XL) 100 MG 24 hr tablet Take 1 tablet (100 mg total) by mouth 2 (two) times daily. Take with or immediately following a meal. 60 tablet 5  . ondansetron (ZOFRAN) 8 MG tablet Take 1 tablet (8 mg total) by mouth every 8 (eight) hours as needed for nausea or vomiting. 30 tablet 2  . ONE TOUCH  LANCETS MISC Use to check sugar 3 times daily 300 each 5  . Vitamin D, Ergocalciferol, (DRISDOL) 50000 units CAPS capsule Take 50,000 Units by mouth every 7 (seven) days.     No current facility-administered medications on file prior to visit.     LABS/IMAGING: No results found for this or any previous visit (from the past 48 hour(s)). No results found.  WEIGHTS: Wt Readings from Last 3 Encounters:  03/23/17 251 lb (113.9 kg)  11/30/16 242 lb (109.8 kg)  09/22/16 233 lb (105.7 kg)    VITALS: Ht 5' 10" (1.778 m)   Wt 251 lb (113.9 kg)   BMI 36.01 kg/m   EXAM: General appearance: alert, no distress, moderately obese and In a wheelchair Neck: no carotid bruit, no JVD and thyroid not enlarged, symmetric, no tenderness/mass/nodules Lungs: clear to auscultation bilaterally Heart: regular rate and rhythm Abdomen: soft, non-tender; bowel sounds normal; no masses,  no organomegaly and Obese Extremities: Status post right BKA, left trace to 1+ lower extremity edema Pulses: 2+ and symmetric Skin: Skin color, texture, turgor normal. No rashes or lesions Neurologic: Grossly normal Psych: Animated  EKG: Normal sinus rhythm at 70-personally reviewed  ASSESSMENT: 1. Anasarca - likely related to nephrotic syndrome, LVEF 80-03%, mild diastolic dysfunction 2. Hypertension 3. Morbid obesity 4. IDDM 5. Status post right BKA  PLAN: 1.   Alex Ryan is to be doing fairly well although recently had some swelling likely related to short course of steroids prescribed by his orthopedist.  He is status post right BKA and is being fitted for prosthesis.  Blood pressure is elevated today however he says it is better controlled at home.  His echo showed mild diastolic dysfunction in the past.  He does have a history of nephrotic syndrome and is followed by Dr. Aundra Dubin with [facility].   No changes to his medications today.  We will see him back annually or sooner as necessary for risk factor  modification.  Pixie Casino, MD, [facility] Attending Cardiologist [place] 03/23/2017, 3:45  PM

## 2017-03-23 NOTE — Patient Instructions (Signed)
Dr Hilty recommends that you schedule a follow-up appointment in 12 months. You will receive a reminder letter in the mail two months in advance. If you don't receive a letter, please call our office to schedule the follow-up appointment.  If you need a refill on your cardiac medications before your next appointment, please call your pharmacy. 

## 2017-03-24 NOTE — Addendum Note (Signed)
Addended by: Venetia Maxon on: 03/24/2017 05:21 PM   Modules accepted: Orders

## 2019-07-23 ENCOUNTER — Ambulatory Visit: Payer: Medicare Other | Attending: Internal Medicine

## 2019-07-23 DIAGNOSIS — Z23 Encounter for immunization: Secondary | ICD-10-CM | POA: Insufficient documentation

## 2019-07-23 NOTE — Progress Notes (Signed)
   Covid-19 Vaccination Clinic  Name:  Alex Ryan    MRN: [id number] DOB: 05-10-1971  07/23/2019  [name] was observed post Covid-19 immunization for 15 minutes without incident. He was provided with Vaccine Information Sheet and instruction to access the V-Safe system.   [name] was instructed to call 911 with any severe reactions post vaccine: Marland Kitchen Difficulty breathing  . Swelling of face and throat  . A fast heartbeat  . A bad rash all over body  . Dizziness and weakness   Immunizations Administered    Name Date Dose VIS Date Route   Pfizer COVID-19 Vaccine 07/23/2019  4:13 PM 0.3 mL 04/28/2019 Intramuscular   Manufacturer: [place]   Lot: [id number]   [place]: [id number]

## 2019-08-15 ENCOUNTER — Ambulatory Visit: Payer: Medicare Other | Attending: Internal Medicine

## 2019-08-15 DIAGNOSIS — Z23 Encounter for immunization: Secondary | ICD-10-CM

## 2019-08-15 NOTE — Progress Notes (Signed)
   Covid-19 Vaccination Clinic  Name:  Alex Ryan    MRN: [id number] DOB: 1970/09/05  08/15/2019  Alex Ryan was observed post Covid-19 immunization for 15 minutes without incident. He was provided with Vaccine Information Sheet and instruction to access the V-Safe system.   Alex Ryan was instructed to call 911 with any severe reactions post vaccine: Marland Kitchen Difficulty breathing  . Swelling of face and throat  . A fast heartbeat  . A bad rash all over body  . Dizziness and weakness   Immunizations Administered    Name Date Dose VIS Date Route   Pfizer COVID-19 Vaccine 08/15/2019  9:37 AM 0.3 mL 04/28/2019 Intramuscular   Manufacturer: [place]   Lot: [id number]   [place]: [id number]
